# Patient Record
Sex: Female | Born: 1950 | Race: White | Hispanic: No | Marital: Married | Smoking: Never smoker
Health system: Southern US, Community
[De-identification: ages and names within clinical notes are randomized; demographics above are authoritative.]

## PROBLEM LIST (undated history)

## (undated) DIAGNOSIS — R5383 Other fatigue: Secondary | ICD-10-CM

## (undated) DIAGNOSIS — I471 Supraventricular tachycardia, unspecified: Secondary | ICD-10-CM

## (undated) DIAGNOSIS — M069 Rheumatoid arthritis, unspecified: Secondary | ICD-10-CM

## (undated) DIAGNOSIS — Z8041 Family history of malignant neoplasm of ovary: Secondary | ICD-10-CM

## (undated) DIAGNOSIS — M1990 Unspecified osteoarthritis, unspecified site: Secondary | ICD-10-CM

## (undated) DIAGNOSIS — Z9889 Other specified postprocedural states: Secondary | ICD-10-CM

## (undated) DIAGNOSIS — Z801 Family history of malignant neoplasm of trachea, bronchus and lung: Secondary | ICD-10-CM

## (undated) DIAGNOSIS — I2510 Atherosclerotic heart disease of native coronary artery without angina pectoris: Secondary | ICD-10-CM

## (undated) DIAGNOSIS — I499 Cardiac arrhythmia, unspecified: Secondary | ICD-10-CM

## (undated) DIAGNOSIS — Z808 Family history of malignant neoplasm of other organs or systems: Secondary | ICD-10-CM

## (undated) DIAGNOSIS — B029 Zoster without complications: Secondary | ICD-10-CM

## (undated) DIAGNOSIS — H9190 Unspecified hearing loss, unspecified ear: Secondary | ICD-10-CM

## (undated) DIAGNOSIS — E079 Disorder of thyroid, unspecified: Secondary | ICD-10-CM

## (undated) DIAGNOSIS — R112 Nausea with vomiting, unspecified: Secondary | ICD-10-CM

## (undated) DIAGNOSIS — G4730 Sleep apnea, unspecified: Secondary | ICD-10-CM

## (undated) DIAGNOSIS — L570 Actinic keratosis: Secondary | ICD-10-CM

## (undated) DIAGNOSIS — F419 Anxiety disorder, unspecified: Secondary | ICD-10-CM

## (undated) DIAGNOSIS — I10 Essential (primary) hypertension: Secondary | ICD-10-CM

## (undated) DIAGNOSIS — N189 Chronic kidney disease, unspecified: Secondary | ICD-10-CM

## (undated) DIAGNOSIS — I509 Heart failure, unspecified: Secondary | ICD-10-CM

## (undated) DIAGNOSIS — R42 Dizziness and giddiness: Secondary | ICD-10-CM

## (undated) DIAGNOSIS — G709 Myoneural disorder, unspecified: Secondary | ICD-10-CM

## (undated) DIAGNOSIS — B0229 Other postherpetic nervous system involvement: Secondary | ICD-10-CM

## (undated) DIAGNOSIS — T753XXA Motion sickness, initial encounter: Secondary | ICD-10-CM

## (undated) HISTORY — DX: Family history of malignant neoplasm of other organs or systems: Z80.8

## (undated) HISTORY — DX: Actinic keratosis: L57.0

## (undated) HISTORY — PX: OTHER SURGICAL HISTORY: SHX169

## (undated) HISTORY — DX: Family history of malignant neoplasm of trachea, bronchus and lung: Z80.1

## (undated) HISTORY — PX: HAND SURGERY: SHX662

## (undated) HISTORY — DX: Other fatigue: R53.83

## (undated) HISTORY — PX: CARDIAC SURGERY: SHX584

## (undated) HISTORY — DX: Anxiety disorder, unspecified: F41.9

## (undated) HISTORY — PX: GASTRIC BYPASS: SHX52

## (undated) HISTORY — PX: ANKLE SURGERY: SHX546

## (undated) HISTORY — PX: APPENDECTOMY: SHX54

## (undated) HISTORY — PX: BLADDER REPAIR: SHX76

## (undated) HISTORY — DX: Family history of malignant neoplasm of ovary: Z80.41

## (undated) HISTORY — DX: Atherosclerotic heart disease of native coronary artery without angina pectoris: I25.10

## (undated) HISTORY — PX: KNEE ARTHROSCOPY: SUR90

## (undated) HISTORY — PX: CARDIAC CATHETERIZATION: SHX172

---

## 1999-05-28 HISTORY — PX: CORONARY ARTERY BYPASS GRAFT: SHX141

## 2014-07-12 DIAGNOSIS — F32A Depression, unspecified: Secondary | ICD-10-CM | POA: Insufficient documentation

## 2014-07-12 DIAGNOSIS — E782 Mixed hyperlipidemia: Secondary | ICD-10-CM | POA: Insufficient documentation

## 2014-07-12 DIAGNOSIS — M25519 Pain in unspecified shoulder: Secondary | ICD-10-CM | POA: Insufficient documentation

## 2014-07-12 DIAGNOSIS — F329 Major depressive disorder, single episode, unspecified: Secondary | ICD-10-CM | POA: Insufficient documentation

## 2014-07-12 DIAGNOSIS — E785 Hyperlipidemia, unspecified: Secondary | ICD-10-CM | POA: Insufficient documentation

## 2014-07-12 DIAGNOSIS — E039 Hypothyroidism, unspecified: Secondary | ICD-10-CM | POA: Insufficient documentation

## 2014-07-22 DIAGNOSIS — M65819 Other synovitis and tenosynovitis, unspecified shoulder: Secondary | ICD-10-CM | POA: Insufficient documentation

## 2014-07-22 DIAGNOSIS — M7542 Impingement syndrome of left shoulder: Secondary | ICD-10-CM | POA: Insufficient documentation

## 2014-07-22 DIAGNOSIS — M65812 Other synovitis and tenosynovitis, left shoulder: Secondary | ICD-10-CM | POA: Insufficient documentation

## 2014-07-22 DIAGNOSIS — M7540 Impingement syndrome of unspecified shoulder: Secondary | ICD-10-CM | POA: Insufficient documentation

## 2014-08-03 ENCOUNTER — Ambulatory Visit: Payer: Self-pay | Admitting: Otolaryngology

## 2014-11-27 ENCOUNTER — Emergency Department
Admission: EM | Admit: 2014-11-27 | Discharge: 2014-11-28 | Disposition: A | Discharge status: Home / Self Care | Payer: Medicare Other | Attending: Emergency Medicine | Admitting: Emergency Medicine

## 2014-11-27 ENCOUNTER — Other Ambulatory Visit: Payer: Self-pay

## 2014-11-27 ENCOUNTER — Emergency Department: Payer: Medicare Other

## 2014-11-27 DIAGNOSIS — Z79899 Other long term (current) drug therapy: Secondary | ICD-10-CM | POA: Insufficient documentation

## 2014-11-27 DIAGNOSIS — I10 Essential (primary) hypertension: Secondary | ICD-10-CM | POA: Insufficient documentation

## 2014-11-27 DIAGNOSIS — Z7982 Long term (current) use of aspirin: Secondary | ICD-10-CM | POA: Insufficient documentation

## 2014-11-27 DIAGNOSIS — R1011 Right upper quadrant pain: Secondary | ICD-10-CM | POA: Diagnosis present

## 2014-11-27 DIAGNOSIS — Z880 Allergy status to penicillin: Secondary | ICD-10-CM | POA: Insufficient documentation

## 2014-11-27 DIAGNOSIS — R52 Pain, unspecified: Secondary | ICD-10-CM

## 2014-11-27 DIAGNOSIS — K858 Other acute pancreatitis without necrosis or infection: Secondary | ICD-10-CM

## 2014-11-27 DIAGNOSIS — R0789 Other chest pain: Secondary | ICD-10-CM | POA: Diagnosis not present

## 2014-11-27 DIAGNOSIS — R1084 Generalized abdominal pain: Secondary | ICD-10-CM

## 2014-11-27 DIAGNOSIS — N390 Urinary tract infection, site not specified: Secondary | ICD-10-CM | POA: Diagnosis not present

## 2014-11-27 DIAGNOSIS — I208 Other forms of angina pectoris: Secondary | ICD-10-CM

## 2014-11-27 DIAGNOSIS — I209 Angina pectoris, unspecified: Secondary | ICD-10-CM | POA: Diagnosis not present

## 2014-11-27 HISTORY — DX: Essential (primary) hypertension: I10

## 2014-11-27 HISTORY — DX: Unspecified osteoarthritis, unspecified site: M19.90

## 2014-11-27 HISTORY — DX: Disorder of thyroid, unspecified: E07.9

## 2014-11-27 HISTORY — DX: Rheumatoid arthritis, unspecified: M06.9

## 2014-11-27 LAB — COMPREHENSIVE METABOLIC PANEL
ALBUMIN: 3.9 g/dL (ref 3.5–5.0)
ALK PHOS: 71 U/L (ref 38–126)
ALT: 29 U/L (ref 14–54)
AST: 35 U/L (ref 15–41)
Anion gap: 9 (ref 5–15)
BUN: 25 mg/dL — AB (ref 6–20)
CALCIUM: 9.3 mg/dL (ref 8.9–10.3)
CO2: 24 mmol/L (ref 22–32)
Chloride: 109 mmol/L (ref 101–111)
Creatinine, Ser: 0.8 mg/dL (ref 0.44–1.00)
GFR calc Af Amer: >60 mL/min (ref >60)
GFR calc non Af Amer: >60 mL/min (ref >60)
Glucose, Bld: 93 mg/dL (ref 65–99)
POTASSIUM: 3.5 mmol/L (ref 3.5–5.1)
SODIUM: 142 mmol/L (ref 135–145)
TOTAL PROTEIN: 6.9 g/dL (ref 6.5–8.1)
Total Bilirubin: 0.4 mg/dL (ref 0.3–1.2)

## 2014-11-27 LAB — CBC
HCT: 39.8 % (ref 35.0–47.0)
Hemoglobin: 14.1 g/dL (ref 12.0–16.0)
MCH: 32.9 pg (ref 26.0–34.0)
MCHC: 35.4 g/dL (ref 32.0–36.0)
MCV: 93 fL (ref 80.0–100.0)
Platelets: 221 10*3/uL (ref 150–440)
RBC: 4.28 MIL/uL (ref 3.80–5.20)
RDW: 12.8 % (ref 11.5–14.5)
WBC: 7.6 10*3/uL (ref 3.6–11.0)

## 2014-11-27 LAB — TROPONIN I: Troponin I: <0.03 ng/mL (ref <0.031)

## 2014-11-27 LAB — LIPASE, BLOOD: Lipase: 150 U/L — ABNORMAL HIGH (ref 22–51)

## 2014-11-27 MED ORDER — SODIUM CHLORIDE 0.9 % IV BOLUS (SEPSIS)
500.0000 mL | Freq: Once | INTRAVENOUS | Status: AC
  Administered 2014-11-27: 500 mL via INTRAVENOUS

## 2014-11-27 NOTE — ED Provider Notes (Signed)
[facility] Emergency Department Provider Note  ____________________________________________  Time seen: Approximately 11:27 PM  I have reviewed the triage vital signs and the nursing notes.   HISTORY  Chief Complaint Abdominal Pain    HPI Lori Wagner is a 64 y.o. female who presents to the ED from home with a chief complaint of abdominal cramping. Patient states she ate [place] food for dinner, and began having severe abdominal cramping approximately 8 PM not associated with nausea/vomiting/diarrhea. Patient has a history of gastric sleeve. States the abdominal cramping went into her chest and caused anginal symptoms which were not associated with diaphoresis, had slight shortness of breath. Anginal symptoms completely relieved with 1 nitroglycerin taken prior to arrival. Patient states usually she would not take a nitroglycerin for the anginal symptoms she was experiencing; however, she took a nitroglycerin given the abdominal crampingshe was having as well. States she has occasional anginal symptoms. Denies fever, chills, dysuria, headache, weakness, numbness, tingling. Currently all symptoms have resolved.   Past Medical History  Diagnosis Date  . Arthritis   . RA (rheumatoid arthritis)   . Hypertension   . Thyroid disease     There are no active problems to display for this patient.   Past Surgical History  Procedure Laterality Date  . Coronary artery bypass graft    . Appendectomy    . Renal colic    . Cardiac surgery    . Gastric bypass      Current Outpatient Rx  Name  Route  Sig  Dispense  Refill  . aspirin 81 MG tablet   Oral   Take 81 mg by mouth daily. 2 tablets daily         . atorvastatin (LIPITOR) 80 MG tablet   Oral   Take 80 mg by mouth daily.         Marland Kitchen buPROPion (WELLBUTRIN SR) 150 MG 12 hr tablet   Oral   Take 150 mg by mouth.         . DULoxetine (CYMBALTA) 60 MG capsule   Oral   Take 60 mg by mouth  daily.         Marland Kitchen geriatric multivitamins-minerals (ELDERTONIC/GEVRABON) ELIX   Oral   Take 15 mLs by mouth daily.         . isosorbide mononitrate (IMDUR) 30 MG 24 hr tablet   Oral   Take 30 mg by mouth daily.         Marland Kitchen levothyroxine (SYNTHROID, LEVOTHROID) 50 MCG tablet   Oral   Take 50 mcg by mouth daily before breakfast.         . metoprolol succinate (TOPROL-XL) 50 MG 24 hr tablet   Oral   Take 50 mg by mouth 2 (two) times daily. Take with or immediately following a meal.         . Multiple Vitamins-Minerals (MULTIVITAMIN ADULT PO)   Oral   Take by mouth.         . nitroGLYCERIN (NITROSTAT) 0.4 MG SL tablet   Sublingual   Place 0.4 mg under the tongue every 5 (five) minutes as needed for chest pain.         . Omega-3 Fatty Acids (FISH OIL CONCENTRATE) 1000 MG CAPS   Oral   Take 4,000 mg by mouth.           Allergies Ace inhibitors; Morphine and related; Penicillins; and Sulfa antibiotics  No family history on file.  Social History History  Substance  Use Topics  . Smoking status: Never Smoker   . Smokeless tobacco: Never Used  . Alcohol Use: No    Review of Systems Constitutional: No fever/chills Eyes: No visual changes. ENT: No sore throat. Cardiovascular: Positive for chest pain. Respiratory: Denies shortness of breath. Gastrointestinal: Positive for abdominal pain.  No nausea, no vomiting.  No diarrhea.  No constipation. Genitourinary: Negative for dysuria. Musculoskeletal: Negative for back pain. Skin: Negative for rash. Neurological: Negative for headaches, focal weakness or numbness.  10-point ROS otherwise negative.  ____________________________________________   PHYSICAL EXAM:  VITAL SIGNS: ED Triage Vitals  Enc Vitals Group     BP 11/27/14 2218 99/65 mmHg     Pulse Rate 11/27/14 2218 67     Resp 11/27/14 2300 23     Temp --      Temp src --      SpO2 11/27/14 2218 100 %     Weight 11/27/14 2152 146 lb (66.225 kg)      Height 11/27/14 2152 5\' 5"  (1.651 m)     Head Cir --      Peak Flow --      Pain Score 11/27/14 2150 4     Pain Loc --      Pain Edu? --      Excl. in [place]? --     Constitutional: Alert and oriented. Well appearing and in no acute distress. Eyes: Conjunctivae are normal. PERRL. EOMI. Head: Atraumatic. Nose: No congestion/rhinnorhea. Mouth/Throat: Mucous membranes are moist.  Oropharynx non-erythematous. Neck: No stridor.   Cardiovascular: Normal rate, regular rhythm. Grossly normal heart sounds.  Good peripheral circulation. Respiratory: Normal respiratory effort.  No retractions. Lungs CTAB. Gastrointestinal: Soft and nontender. No distention. No abdominal bruits. No CVA tenderness. Musculoskeletal: No lower extremity tenderness nor edema.  No joint effusions. Neurologic:  Normal speech and language. No gross focal neurologic deficits are appreciated. Speech is normal. No gait instability. Skin:  Skin is warm, dry and intact. No rash noted. Psychiatric: Mood and affect are normal. Speech and behavior are normal.  ____________________________________________   LABS (all labs ordered are listed, but only abnormal results are displayed)  Labs Reviewed  COMPREHENSIVE METABOLIC PANEL - Abnormal; Notable for the following:    BUN 25 (*)    All other components within normal limits  LIPASE, BLOOD - Abnormal; Notable for the following:    Lipase 150 (*)    All other components within normal limits  URINALYSIS COMPLETEWITH MICROSCOPIC ([facility] ONLY) - Abnormal; Notable for the following:    Color, Urine YELLOW (*)    APPearance HAZY (*)    Leukocytes, UA 1+ (*)    Bacteria, UA RARE (*)    Squamous Epithelial / LPF 0-5 (*)    All other components within normal limits  CBC  TROPONIN I  TROPONIN I   ____________________________________________  EKG  ED ECG REPORT I, Charmayne Odell J, the attending physician, personally viewed and interpreted this ECG.   Date: 11/27/2014  EKG Time:  2151  Rate: 60  Rhythm: normal EKG, normal sinus rhythm  Axis: Normal  Intervals:right bundle branch block  ST&T Change: Nonspecific  ____________________________________________  RADIOLOGY  Chest 2 view (viewed by me, interpreted by Dr. Dorann Lodge): No acute cardiopulmonary process.  Ultrasound abdomen limited RUQ interpreted per Dr. Radene Knee: Unremarkable right upper quadrant ultrasound. ____________________________________________   PROCEDURES  Procedure(s) performed: None  Critical Care performed: No  ____________________________________________   INITIAL IMPRESSION / ASSESSMENT AND PLAN / ED COURSE  Pertinent labs &  imaging results that were available during my care of the patient were reviewed by me and considered in my medical decision making (see chart for details).  64 year old female with a history of CAD and gastric sleeve surgery who presents with abdominal cramping after eating [place] food which precipitated anginal symptoms. Voicing no complaints currently. First troponin negative. Plan for IV fluid resuscitation, check urinalysis, repeat 3 hour troponin.  ----------------------------------------- 1:25 AM on 11/28/2014 -----------------------------------------  Patient is resting comfortably and watching TV with spouse at bedside. Updated patient of urine results indicating UTI. Will start patient on Cipro. Also noted is elevated lipase. Patient still has her gallbladder; will obtain ultrasound to evaluate biliary tree.  ----------------------------------------- 3:37 AM on 11/28/2014 -----------------------------------------  Patient resting in no acute distress. Voices no complaints of chest or abdominal pain. Updated patient and spouse of negative ultrasound results and repeat negative troponin. Strict return precautions given. Both verbalize understanding and agree with plan of care.  ____________________________________________   FINAL CLINICAL  IMPRESSION(S) / ED DIAGNOSES  Final diagnoses:  Generalized abdominal pain  Other acute pancreatitis  Angina at rest  UTI (lower urinary tract infection)      Paulette Blanch, MD 11/28/14 [phone]

## 2014-11-27 NOTE — ED Notes (Signed)
Patient reports she has had gastric sleeve surgery and tonight after she ate she had abd cramping that led to angina.  States she took a nitro and that has help the chest pain but it has not totally gone away.Marland Kitchen

## 2014-11-27 NOTE — ED Notes (Signed)
Patient here for abdominal cramping. Began about 8PM tonight. Patient ate [place] food for dinner. States she has hx of gastric sleeving but does not believe this is related. Denies any vomiting or diarrhea. Was SOB with some angina but states that is better now.

## 2014-11-28 ENCOUNTER — Emergency Department: Payer: Medicare Other

## 2014-11-28 LAB — URINALYSIS COMPLETE WITH MICROSCOPIC (ARMC ONLY)
Bilirubin Urine: NEGATIVE
Glucose, UA: NEGATIVE mg/dL
HGB URINE DIPSTICK: NEGATIVE
Ketones, ur: NEGATIVE mg/dL
Nitrite: NEGATIVE
PH: 6 (ref 5.0–8.0)
Protein, ur: NEGATIVE mg/dL
SPECIFIC GRAVITY, URINE: 1.026 (ref 1.005–1.030)

## 2014-11-28 LAB — TROPONIN I: Troponin I: <0.03 ng/mL (ref <0.031)

## 2014-11-28 MED ORDER — CIPROFLOXACIN HCL 500 MG PO TABS: 500.0000 mg | ORAL_TABLET | Freq: Two times a day (BID) | ORAL | Status: DC

## 2014-11-28 MED ORDER — HYDROCODONE-ACETAMINOPHEN 5-325 MG PO TABS: 1.0000 | ORAL_TABLET | Freq: Four times a day (QID) | ORAL | Status: DC | PRN

## 2014-11-28 MED ORDER — ONDANSETRON HCL 4 MG PO TABS: 4.0000 mg | ORAL_TABLET | Freq: Three times a day (TID) | ORAL | Status: DC | PRN

## 2014-11-28 NOTE — ED Notes (Signed)
Patient to ultrasound

## 2014-11-28 NOTE — Discharge Instructions (Signed)
1. Take antibiotic as prescribed (Cipro 500mg  twice daily x 7 days). 2. Take medicines as needed for pain & nausea (Norco/Zofran #15). 3. Bland diet x 1 week, then slowly advance diet as tolerated.  4. Return to the ER for worsening symptoms, fever, persistent vomiting, chest pain or other concerns.  Abdominal Pain Many things can cause abdominal pain. Usually, abdominal pain is not caused by a disease and will improve without treatment. It can often be observed and treated at home. Your health care provider will do a physical exam and possibly order blood tests and X-rays to help determine the seriousness of your pain. However, in many cases, more time must pass before a clear cause of the pain can be found. Before that point, your health care provider may not know if you need more testing or further treatment. HOME CARE INSTRUCTIONS  Monitor your abdominal pain for any changes. The following actions may help to alleviate any discomfort you are experiencing:  Only take over-the-counter or prescription medicines as directed by your health care provider.  Do not take laxatives unless directed to do so by your health care provider.  Try a clear liquid diet (broth, tea, or water) as directed by your health care provider. Slowly move to a bland diet as tolerated. SEEK MEDICAL CARE IF:  You have unexplained abdominal pain.  You have abdominal pain associated with nausea or diarrhea.  You have pain when you urinate or have a bowel movement.  You experience abdominal pain that wakes you in the night.  You have abdominal pain that is worsened or improved by eating food.  You have abdominal pain that is worsened with eating fatty foods.  You have a fever. SEEK IMMEDIATE MEDICAL CARE IF:   Your pain does not go away within 2 hours.  You keep throwing up (vomiting).  Your pain is felt only in portions of the abdomen, such as the right side or the left lower portion of the abdomen.  You  pass bloody or black tarry stools. MAKE SURE YOU:  Understand these instructions.   Will watch your condition.   Will get help right away if you are not doing well or get worse.  Document Released: 02/20/2005 Document Revised: 05/18/2013 Document Reviewed: 01/20/2013 [facility] Patient Information 2015 [place], [place]. This information is not intended to replace advice given to you by your health care provider. Make sure you discuss any questions you have with your health care provider.  Chest Pain (Nonspecific) It is often hard to give a specific diagnosis for the cause of chest pain. There is always a chance that your pain could be related to something serious, such as a heart attack or a blood clot in the lungs. You need to follow up with your health care provider for further evaluation. CAUSES   Heartburn.  Pneumonia or bronchitis.  Anxiety or stress.  Inflammation around your heart (pericarditis) or lung (pleuritis or pleurisy).  A blood clot in the lung.  A collapsed lung (pneumothorax). It can develop suddenly on its own (spontaneous pneumothorax) or from trauma to the chest.  Shingles infection (herpes zoster virus). The chest wall is composed of bones, muscles, and cartilage. Any of these can be the source of the pain.  The bones can be bruised by injury.  The muscles or cartilage can be strained by coughing or overwork.  The cartilage can be affected by inflammation and become sore (costochondritis). DIAGNOSIS  Lab tests or other studies may be needed to  find the cause of your pain. Your health care provider may have you take a test called an ambulatory electrocardiogram (ECG). An ECG records your heartbeat patterns over a 24-hour period. You may also have other tests, such as:  Transthoracic echocardiogram (TTE). During echocardiography, sound waves are used to evaluate how blood flows through your heart.  Transesophageal echocardiogram (TEE).  Cardiac  monitoring. This allows your health care provider to monitor your heart rate and rhythm in real time.  Holter monitor. This is a portable device that records your heartbeat and can help diagnose heart arrhythmias. It allows your health care provider to track your heart activity for several days, if needed.  Stress tests by exercise or by giving medicine that makes the heart beat faster. TREATMENT   Treatment depends on what may be causing your chest pain. Treatment may include:  Acid blockers for heartburn.  Anti-inflammatory medicine.  Pain medicine for inflammatory conditions.  Antibiotics if an infection is present.  You may be advised to change lifestyle habits. This includes stopping smoking and avoiding alcohol, caffeine, and chocolate.  You may be advised to keep your head raised (elevated) when sleeping. This reduces the chance of acid going backward from your stomach into your esophagus. Most of the time, nonspecific chest pain will improve within 2-3 days with rest and mild pain medicine.  HOME CARE INSTRUCTIONS   If antibiotics were prescribed, take them as directed. Finish them even if you start to feel better.  For the next few days, avoid physical activities that bring on chest pain. Continue physical activities as directed.  Do not use any tobacco products, including cigarettes, chewing tobacco, or electronic cigarettes.  Avoid drinking alcohol.  Only take medicine as directed by your health care provider.  Follow your health care provider's suggestions for further testing if your chest pain does not go away.  Keep any follow-up appointments you made. If you do not go to an appointment, you could develop lasting (chronic) problems with pain. If there is any problem keeping an appointment, call to reschedule. SEEK MEDICAL CARE IF:   Your chest pain does not go away, even after treatment.  You have a rash with blisters on your chest.  You have a fever. SEEK  IMMEDIATE MEDICAL CARE IF:   You have increased chest pain or pain that spreads to your arm, neck, jaw, back, or abdomen.  You have shortness of breath.  You have an increasing cough, or you cough up blood.  You have severe back or abdominal pain.  You feel nauseous or vomit.  You have severe weakness.  You faint.  You have chills. This is an emergency. Do not wait to see if the pain will go away. Get medical help at once. Call your local emergency services (911 in [place] Do not drive yourself to the hospital. MAKE SURE YOU:   Understand these instructions.  Will watch your condition.  Will get help right away if you are not doing well or get worse. Document Released: 02/20/2005 Document Revised: 05/18/2013 Document Reviewed: 12/17/2007 [facility] Patient Information 2015 [place], [place]. This information is not intended to replace advice given to you by your health care provider. Make sure you discuss any questions you have with your health care provider.  Urinary Tract Infection Urinary tract infections (UTIs) can develop anywhere along your urinary tract. Your urinary tract is your body's drainage system for removing wastes and extra water. Your urinary tract includes two kidneys, two ureters, a bladder, and  a urethra. Your kidneys are a pair of bean-shaped organs. Each kidney is about the size of your fist. They are located below your ribs, one on each side of your spine. CAUSES Infections are caused by microbes, which are microscopic organisms, including fungi, viruses, and bacteria. These organisms are so small that they can only be seen through a microscope. Bacteria are the microbes that most commonly cause UTIs. SYMPTOMS  Symptoms of UTIs may vary by age and gender of the patient and by the location of the infection. Symptoms in young women typically include a frequent and intense urge to urinate and a painful, burning feeling in the bladder or urethra during urination. Older  women and men are more likely to be tired, shaky, and weak and have muscle aches and abdominal pain. A fever may mean the infection is in your kidneys. Other symptoms of a kidney infection include pain in your back or sides below the ribs, nausea, and vomiting. DIAGNOSIS To diagnose a UTI, your caregiver will ask you about your symptoms. Your caregiver also will ask to provide a urine sample. The urine sample will be tested for bacteria and white blood cells. White blood cells are made by your body to help fight infection. TREATMENT  Typically, UTIs can be treated with medication. Because most UTIs are caused by a bacterial infection, they usually can be treated with the use of antibiotics. The choice of antibiotic and length of treatment depend on your symptoms and the type of bacteria causing your infection. HOME CARE INSTRUCTIONS  If you were prescribed antibiotics, take them exactly as your caregiver instructs you. Finish the medication even if you feel better after you have only taken some of the medication.  Drink enough water and fluids to keep your urine clear or pale yellow.  Avoid caffeine, tea, and carbonated beverages. They tend to irritate your bladder.  Empty your bladder often. Avoid holding urine for long periods of time.  Empty your bladder before and after sexual intercourse.  After a bowel movement, women should cleanse from front to back. Use each tissue only once. SEEK MEDICAL CARE IF:   You have back pain.  You develop a fever.  Your symptoms do not begin to resolve within 3 days. SEEK IMMEDIATE MEDICAL CARE IF:   You have severe back pain or lower abdominal pain.  You develop chills.  You have nausea or vomiting.  You have continued burning or discomfort with urination. MAKE SURE YOU:   Understand these instructions.  Will watch your condition.  Will get help right away if you are not doing well or get worse. Document Released: 02/20/2005 Document  Revised: 11/12/2011 Document Reviewed: 06/21/2011 [facility] Patient Information 2015 [place], [place]. This information is not intended to replace advice given to you by your health care provider. Make sure you discuss any questions you have with your health care provider.

## 2014-12-05 ENCOUNTER — Encounter: Payer: Self-pay | Admitting: Psychiatry

## 2014-12-05 ENCOUNTER — Other Ambulatory Visit: Payer: Self-pay

## 2014-12-05 ENCOUNTER — Ambulatory Visit (INDEPENDENT_AMBULATORY_CARE_PROVIDER_SITE_OTHER): Payer: Medicare Other | Admitting: Psychiatry

## 2014-12-05 VITALS — BP 118/68 | HR 72 | Temp 98.1°F | Ht 65.5 in | Wt 153.2 lb

## 2014-12-05 DIAGNOSIS — F332 Major depressive disorder, recurrent severe without psychotic features: Secondary | ICD-10-CM

## 2014-12-05 DIAGNOSIS — F410 Panic disorder [episodic paroxysmal anxiety] without agoraphobia: Secondary | ICD-10-CM

## 2014-12-05 MED ORDER — LORAZEPAM 0.5 MG PO TABS: 0.5000 mg | ORAL_TABLET | Freq: Every day | ORAL | Status: DC
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
## 2014-12-21 ENCOUNTER — Ambulatory Visit (INDEPENDENT_AMBULATORY_CARE_PROVIDER_SITE_OTHER): Payer: Medicare Other | Admitting: Licensed Clinical Social Worker

## 2014-12-21 DIAGNOSIS — F332 Major depressive disorder, recurrent severe without psychotic features: Secondary | ICD-10-CM

## 2014-12-21 DIAGNOSIS — F410 Panic disorder [episodic paroxysmal anxiety] without agoraphobia: Secondary | ICD-10-CM
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
## 2014-12-28 ENCOUNTER — Ambulatory Visit (INDEPENDENT_AMBULATORY_CARE_PROVIDER_SITE_OTHER): Payer: Medicare Other | Admitting: Licensed Clinical Social Worker

## 2014-12-28 DIAGNOSIS — F332 Major depressive disorder, recurrent severe without psychotic features: Secondary | ICD-10-CM | POA: Diagnosis not present

## 2014-12-28 DIAGNOSIS — F410 Panic disorder [episodic paroxysmal anxiety] without agoraphobia: Secondary | ICD-10-CM | POA: Diagnosis not present

## 2014-12-28 NOTE — Progress Notes (Signed)
   THERAPIST PROGRESS NOTE  Session Time: 11:15 a.m. - 12:20 p.m.  Participation Level: Active  Behavioral Response: NeatAlertAngry, Anxious and Depressed  Type of Therapy: Individual Therapy  Treatment Goals addressed: Anger and Coping  Interventions: Strength-based, Supportive and Reframing  Summary: Lori Wagner is a 64 y.o. female who presents with ongoing depression, anxiety and anger as primary concerns.  Remains compliant with medications and denied any concerns with current psychotropics.  Client discussed various conflicts with friends and stated that friends "Accuse me of having to have the last word."  She admits to have a stubborn streak when she is passionate about situations/beliefs.  Fear discussed about not trusting herself or her abilities to gauge or interpret things that people say to her.  Additional examples shared with LCSW in terms of inter-personal situations and client with increasing awareness around the impact of automatic negative thoughts/self-appraisal and interpretation of others as being critical or rejecting of her and her beliefs/opinions.    Client described personal thoughts, feelings and experiences related to negative self talk, fears of embarrassing self or being judged by others and acknowledged presence of unhelpful or irrational thinking styles.  Inadequate pattern of communication with partner, friends and former colleagues was discussed and how these have led to frequent arguing and conflicts, especially when Detta admits to "I think they think I'm stupid or wrong."  She denied panics, able to tend to tasks around the home and one source of stress continues with friend and friend's significant other helping client and her spouse with home repairs that are taking longer than expected.  Otherwise, Cecile denied recent outbursts.  No additional concerns or needs indicated and she remains active in the therapeutic process and indicated that sessions are  meeting her needs.  Suicidal/Homicidal: Negativewithout intent/plan  Therapist Response:   CBT used to restructure thoughts with emphasis on helping client to manage emotions, including anger, fear and sadness that are realistic and understandable in context of diagnosis and degree of functional losses.  Examined cognitions and used CBT to defuse thoughts of helplessness, negative forecasting and catastrophizing about situations involving her relationships.  Engaged client in evaluating thoughts in terms of their usefulness and their basis in reality. Commended client on learning to identify and challenge beliefs that represent distortions of reality and that are destructive to client's well-being and social and intimate relationships.  Supportive psychotherapy with insight was provided to client along with much emotional support and encouragement.  Commended client for opening up about emotional symptoms that have negatively impacted her mood and behaviors.  Plan: Return again in 1-2 weeks.  Diagnosis: Major Depressive Disorder, Recurrent, Severe, Without Psychosis   Panic Disorder   Miguel Dibble, LCSW 12/28/2014

## 2015-01-04 ENCOUNTER — Ambulatory Visit (INDEPENDENT_AMBULATORY_CARE_PROVIDER_SITE_OTHER): Payer: Medicare Other | Admitting: Licensed Clinical Social Worker

## 2015-01-04 DIAGNOSIS — F410 Panic disorder [episodic paroxysmal anxiety] without agoraphobia: Secondary | ICD-10-CM | POA: Diagnosis not present

## 2015-01-04 DIAGNOSIS — F332 Major depressive disorder, recurrent severe without psychotic features: Secondary | ICD-10-CM

## 2015-01-04 NOTE — Progress Notes (Signed)
THERAPIST PROGRESS NOTE  Session Time: 2:20 p.m. - 3:15 p.m.  Participation Level: Active  Behavioral Response: DisheveledAlertAnxious and Depressed  Type of Therapy: Individual Therapy  Treatment Goals addressed: Anger, Anxiety and Coping  Interventions: CBT, Solution Focused, Strength-based, Supportive, Family Systems and Reframing  Summary: Lori Wagner is a 64 y.o. 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  Lori Wagner shared another situation in her twenties where she was attending a political event and her behavior was mis-interpreted leading to rumors and media coverage of her being "A communist and a lesbian."  She discussed several triggers, one being conversations with people who repeat things about people that are rumors or fail to include of all of the facts. Being a victim, both real and imagined beliefs around this contribute to reported symptoms and Lori Wagner voiced awareness of this as her experience.  Additional self-admission that "I know I can get angry."  Discussed underlying culprits/causes for anger that becomes out of control for her and client indicated being passionate about something that she believes in or disagrees with can sometimes be a contributing factor.  Ongoing conflict with friends who are helping to do home remodeling.  She admits her role in the conflict.  Processed with LCSW the family of origins and history with these people with Lori Wagner coming to realization that "That is him. I decided to take the high road. I could have asked him where my apology was but I let it go."  Her decision was that friendship with  long term friend was more important than trying to prove herself right to friend or friend's significant other.  Client shared more history about their relationship and was able to identify several thoughts and feelings that increase her inability to tolerate the friend's partner.  Lori Wagner indicated that she continues to feel that therapy is going in an appropriate direction and that LCSW is assisting her to bring out unresolved issues and more clearly recognize the role of self-directed anger, defense mechanisms and unhealthy thinking patterns on her mood and outlook.  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  "I'm not letting them off of the hook. If they don't email or call back then I'll show up where they work."  Underlying triggers for anger were discussed further.  She has a follow up later in the week with Dr. Jimmye Norman in this office for medication management.  Suicidal/Homicidal: Negativewithout intent/plan  Therapist Response:   Reinforced client's strengths and motivation to process unhealthy thinking patterns that contribute to client's irritable mood, insecurity, defense stance and beliefs that others are either rejecting of her or disapproving.  Gently cautioned about internalizing other's behaviors and comments while seeing where there may be opportunities for her to let go of relationships and situations that she does not have control over with goal being acceptance and integration of these events as part of her life story.   Supportive psychotherapy with insight was provided to client along with much emotional support and encouragement.  Commended client for opening up about  emotional symptoms that have negatively impacted her moods, behaviors and have placed a strain on her relationships.  Work with client in sessions to educate to anger process and to develop non-self-defeating ways  of handling angry feelings and decreasing incidents of aggression.  Provided additional psycho-educational materials on anger and questions to begin to ask self about her anger for discussion at next session.  Plan: Return again in 1-2 weeks. Lori Wagner will take medications as prescribed and keep all appointments.    Diagnosis: Major Depressive Disorder, Recurrent, Severe, Without Psychosis   Panic Disorder  Miguel Dibble, LCSW 01/04/2015

## 2015-01-05 ENCOUNTER — Ambulatory Visit (INDEPENDENT_AMBULATORY_CARE_PROVIDER_SITE_OTHER): Payer: Medicare Other | Admitting: Psychiatry

## 2015-01-05 DIAGNOSIS — F332 Major depressive disorder, recurrent severe without psychotic features: Secondary | ICD-10-CM

## 2015-01-05 MED ORDER — BUPROPION HCL ER (SR) 150 MG PO TB12: 150.0000 mg | ORAL_TABLET | Freq: Two times a day (BID) | ORAL | Status: DC

## 2015-01-05 MED ORDER — CLONAZEPAM 0.5 MG PO TABS: 0.5000 mg | ORAL_TABLET | Freq: Two times a day (BID) | ORAL | Status: DC

## 2015-01-05 MED ORDER — DULOXETINE HCL 60 MG PO CPEP: 60.0000 mg | ORAL_CAPSULE | Freq: Every day | ORAL | Status: DC

## 2015-01-05 NOTE — Progress Notes (Signed)
BH MD/PA/NP OP Progress Note  01/05/2015 10:57 AM Lori Wagner  MRN:  [id number]  Subjective:  Patient returns for follow-up of her major depressive disorder, recurrent, severe without psychotic features and panic disorder. She states the biggest issue for her right now is that some friends are in the house for the past 6 weeks helping with the renovation. She states is been multiple disputes about how to proceed with plans. She states they have occasionally resulted in her having a "meltdown." She stated one most recently she ended up yelling and cursing at her friend. She states she has not been able to enjoy many things since they have been around however they will be leaving in 2 days and she states she looks forward to a quiet time just being able to sit down and watch TV. She states she has not really been experiencing continuous depression where she is not getting out of bed but that she does have a couple of days here and there were she is down. She states her more predominant issue is just constantly feeling angry and anxious. Chief Complaint: angry Visit Diagnosis:  No diagnosis found.  Past Medical History:  Past Medical History  Diagnosis Date  . Arthritis   . RA (rheumatoid arthritis)   . Hypertension   . Thyroid disease   . Anxiety   . Coronary artery disease   . Fatigue     Past Surgical History  Procedure Laterality Date  . Coronary artery bypass graft    . Appendectomy    . Renal colic    . Cardiac surgery    . Gastric bypass    . Ankle surgery Left   . Hand surgery Right   . Bladder repair     Family History:  Family History  Problem Relation Age of Onset  . Hypertension Mother   . Coronary artery disease Mother   . Arthritis Mother   . Osteoporosis Mother   . Depression Mother   . Anxiety disorder Mother   . Coronary artery disease Father   . Stroke Father   . Coronary artery disease Brother   . Coronary artery disease Maternal Aunt   . Osteoporosis  Maternal Aunt   . Coronary artery disease Cousin   . Osteoporosis Cousin   . Bipolar disorder Brother   . Drug abuse Brother    Social History:  Social History   Social History  . Marital Status: Married    Spouse Name: N/A  . Number of Children: N/A  . Years of Education: N/A   Social History Main Topics  . Smoking status: Never Smoker   . Smokeless tobacco: Never Used  . Alcohol Use: No  . Drug Use: No  . Sexual Activity: No   Other Topics Concern  . Not on file   Social History Narrative   Additional History:   Assessment:   Musculoskeletal: Strength & Muscle Tone: within normal limits Gait & Station: normal Patient leans: N/A  Psychiatric Specialty Exam: HPI  Review of Systems  Psychiatric/Behavioral: Positive for depression. Negative for suicidal ideas, hallucinations, memory loss and substance abuse. The patient is nervous/anxious and has insomnia.     There were no vitals taken for this visit.There is no weight on file to calculate BMI.  General Appearance: Well Groomed  Eye Contact:  Good  Speech:  Normal Rate  Volume:  Normal  Mood:  Okay  Affect:  Constricted  Thought Process:  Linear and Logical  Orientation:  Full (  Time, Place, and Person)  Thought Content:  Negative  Suicidal Thoughts:  No  Homicidal Thoughts:  No  Memory:  Immediate;   Good Recent;   Good Remote;   Good  Judgement:  Good  Insight:  Good  Psychomotor Activity:  Negative  Concentration:  Good  Recall:  Good  Fund of Knowledge: Good  Language: Good  Akathisia:  Negative  Handed:  Right  AIMS (if indicated):  N/A  Assets:  Communication Skills Desire for Improvement Social Support  ADL's:  Intact  Cognition: WNL  Sleep:  fair   Is the patient at risk to self?  No. Has the patient been a risk to self in the past 6 months?  No. Has the patient been a risk to self within the distant past?  No. Is the patient a risk to others?  No. Has the patient been a risk to  others in the past 6 months?  No. Has the patient been a risk to others within the distant past?  No.  Current Medications: Current Outpatient Prescriptions  Medication Sig Dispense Refill  . aspirin 81 MG tablet Take 81 mg by mouth daily. 2 tablets daily    . atorvastatin (LIPITOR) 80 MG tablet Take 80 mg by mouth daily.    Marland Kitchen buPROPion (WELLBUTRIN SR) 150 MG 12 hr tablet Take 150 mg by mouth.    . calcium carbonate (TUMS EX) 750 MG chewable tablet Chew by mouth.    . Cholecalciferol (VITAMIN D-1000 MAX ST) 1000 UNITS tablet Take by mouth.    . clonazePAM (KLONOPIN) 0.5 MG tablet Take 1 tablet (0.5 mg total) by mouth 2 (two) times daily. 60 tablet 1  . DULoxetine (CYMBALTA) 60 MG capsule Take 60 mg by mouth daily.    . isosorbide mononitrate (IMDUR) 30 MG 24 hr tablet Take 30 mg by mouth daily.    Marland Kitchen levothyroxine (SYNTHROID, LEVOTHROID) 50 MCG tablet Take 50 mcg by mouth daily before breakfast.    . LORazepam (ATIVAN) 0.5 MG tablet Take 1 tablet (0.5 mg total) by mouth at bedtime. 30 tablet 1  . metoprolol succinate (TOPROL-XL) 50 MG 24 hr tablet Take 50 mg by mouth 2 (two) times daily. Take with or immediately following a meal.    . nitroGLYCERIN (NITROSTAT) 0.4 MG SL tablet Place 0.4 mg under the tongue every 5 (five) minutes as needed for chest pain.    . Omega-3 Fatty Acids (FISH OIL CONCENTRATE) 1000 MG CAPS Take 4,000 mg by mouth.     No current facility-administered medications for this visit.    Medical Decision Making:  Established Problem, Stable/Improving (1), Review of Medication Regimen & Side Effects (2) and Review of New Medication or Change in Dosage (2)  Treatment Plan Summary:Medication management and Plan We will continue the patient's Cymbalta at 60 mg daily, continue her Wellbutrin SR 150 mg twice daily. We will discontinue the Ativan and we will start some Klonopin 0.5 mg twice daily to address her anxiety and irritability. Patient follow up in 1 month. Risk and  benefits Klonopin discussed patient able consent. Patient's been encouraged call any questions concerns prior to next point. She is to continue therapy.   Lori Wagner 01/05/2015, 10:57 AM

## 2015-01-23 ENCOUNTER — Ambulatory Visit (INDEPENDENT_AMBULATORY_CARE_PROVIDER_SITE_OTHER): Payer: Medicare Other | Admitting: Licensed Clinical Social Worker

## 2015-01-23 DIAGNOSIS — F332 Major depressive disorder, recurrent severe without psychotic features: Secondary | ICD-10-CM

## 2015-01-23 DIAGNOSIS — F410 Panic disorder [episodic paroxysmal anxiety] without agoraphobia: Secondary | ICD-10-CM | POA: Diagnosis not present

## 2015-01-23 NOTE — Progress Notes (Signed)
THERAPIST PROGRESS NOTE  Session Time: 11:10 a.m. - 12:10 p.m.  Participation Level: Active  Behavioral Response: NeatAlertDepressed  Type of Therapy: Individual Therapy  Treatment Goals addressed: Anger, Anxiety, Communication: assertiveness vs aggression and Coping  Interventions: CBT, Solution Focused, Strength-based, Supportive and Reframing  Summary: Lori Wagner is a 64 y.o. female who returns to [facility] to address symptoms of depression and anxiety who also admits to experiencing episodic feelings of anger. Her friend and friend's partner left the area since the home remodel has been completed. Lori Wagner showed me pictures of her new bathroom.  She admits that she has been able to enjoy her new bathtub which is how she relaxes. "I've been down. I don't know if it's the medication or the fact that completing the bathroom was anti-climatic."  Talked about a job that she is considering applying for at a [employer] but is having difficulty getting professional references.  She spoke about other former colleagues and had not considered asking these individuals to provide a written reference.  "I know I'm stubborn."  She recalled several situations with various peers over the years and session focused on the losses that resulted when she lost her job in a high profile and negative social media experience in 2009.  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  One professional reference whom she is waiting to hear from is a female colleague that has not responded in any format to client even after agreeing to serve as a reference for Lori Wagner.  "I just want to know what I did to make her hate me."  Lori Wagner discussed her thoughts to show up at this person's place of employment to confront the former colleague in attempt to get closure for herself since this person will not return her emails or  phone calls.  Some insight into anger as this relates to client questioning self about occupational decisions that she made as well as not feeling that people really knew her side of the story.  In terms of her application to [employer] job, she discussed with Lori Wagner that she is considering writing an additional letter to attach with letter of interest that offers an overview of her side of the story.  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  Suicidal/Homicidal: Negativewithout intent/plan   Therapist Response:   Supportive psychotherapy with insight was provided to client along with much emotional support and encouragement.  Assisted client to become more aware of common thinking errors that can intensify negative and irrational thoughts and impact a person's feelings, moods and outlook on life.  Assisted client to re-frame a few distorted thoughts while also offering psycho-education on the concepts of radical acceptance and moving through the difficult feelings associated with disappointment in self and others.  Reinforced client's strengths and motivation to process unhealthy thinking patterns that contribute to client's irritable mood, insecurity, defense stance and beliefs that others are either rejecting of her or disapproving.  Gently cautioned about internalizing other's behaviors and comments and explored the concept of self-actualization and transcending either real or perceived failure.  Plan: Return again in 1-2 weeks. Korryn will take medications as prescribed and keep all appointments.  Client will continue to read, process and return to [facility] for ongoing discussions of  psycho-educational materials on depression, communication styles, thinking patterns and anger.  Diagnosis: Major Depressive Disorder, Recurrent, Severe, Without Psychosis   Panic Disorder   Lori Dibble, Lori Wagner 01/23/2015

## 2015-01-24 ENCOUNTER — Telehealth: Payer: Self-pay

## 2015-01-24 NOTE — Telephone Encounter (Signed)
left message for patient to call office tomorrow for more infomatio

## 2015-01-24 NOTE — Telephone Encounter (Signed)
lea gave me a message that pt called and left a message on voice mail that the medication changes were making her more depressed.

## 2015-01-25 NOTE — Telephone Encounter (Signed)
I spoke patient today. She indicated that she felt like since she's been on the Klonopin and she's been having less energy and motivation. I did discuss that perhaps it was a longer acting medicine and was sticking around longer. I've thus instructed her to discontinue her Klonopin and resume her Ativan his previous prescribed which was 0.5 mg at bedtime. We will observe if she notices a difference going back to her old medication. She individually she has a supply of Ativan remaining that may be a little less than a month. She will call if she needs more of the Ativan. She hasn't point upcoming appointment with this writer on 02/10/2015.

## 2015-02-09 ENCOUNTER — Ambulatory Visit: Payer: Medicare Other | Admitting: Licensed Clinical Social Worker

## 2015-02-10 ENCOUNTER — Encounter: Payer: Self-pay | Admitting: Psychiatry

## 2015-02-10 ENCOUNTER — Ambulatory Visit (INDEPENDENT_AMBULATORY_CARE_PROVIDER_SITE_OTHER): Payer: Medicare Other | Admitting: Psychiatry

## 2015-02-10 VITALS — BP 110/62 | HR 82 | Temp 98.1°F | Ht 65.0 in | Wt 151.4 lb

## 2015-02-10 DIAGNOSIS — F332 Major depressive disorder, recurrent severe without psychotic features: Secondary | ICD-10-CM

## 2015-02-10 DIAGNOSIS — F410 Panic disorder [episodic paroxysmal anxiety] without agoraphobia: Secondary | ICD-10-CM

## 2015-02-10 MED ORDER — BUPROPION HCL ER (SR) 200 MG PO TB12: 200.0000 mg | ORAL_TABLET | Freq: Two times a day (BID) | ORAL | Status: DC

## 2015-02-10 MED ORDER — LORAZEPAM 0.5 MG PO TABS: 0.5000 mg | ORAL_TABLET | Freq: Every day | ORAL | Status: DC

## 2015-02-10 NOTE — Progress Notes (Signed)
BH MD/PA/NP OP Progress Note  02/10/2015 12:27 PM Lori Wagner  MRN:  [id number]  Subjective:  Patient returns for follow-up of her major depressive disorder, recurrent, severe without psychotic features and panic disorder. As documented phone conversation we attempted to use Klonopin as opposed to her previously prescribed Ativan to address anxiety and irritability. Patient reported being subdued and perhaps being more depressed on the Klonopin. Thus we changed her back to the Ativan.  She states now she continues to be depressed. She states that it is happening where she is depressed 4 or 5 days out of the week. 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 She states that there was an opportunity posted a Optometrist and then she went through the process of trying to get recommendations and apply for the position. She states some of the people she spoke with about the recommended dictations were telling her that she was unlikely to get the position. She stated in fact when she moved forward she alternately did not get the position. She states this is a constant issue for her and does affect her depression. Chief Complaint: Depression Chief Complaint    Follow-up; Medication Refill; Depression    angry Visit Diagnosis:  No diagnosis found.  Past Medical History:  Past Medical History  Diagnosis Date  . Arthritis   . RA (rheumatoid arthritis)   . Hypertension   . Thyroid disease   . Anxiety   . Coronary artery disease   . Fatigue     Past Surgical History  Procedure Laterality Date  . Coronary artery bypass graft    . Appendectomy    . Renal colic    . Cardiac surgery    . Gastric bypass    . Ankle surgery Left   . Hand surgery Right   . Bladder repair     Family History:  Family History  Problem Relation Age of Onset  . Hypertension Mother   . Coronary artery disease Mother   . Arthritis Mother   . Osteoporosis Mother   . Depression  Mother   . Anxiety disorder Mother   . Coronary artery disease Father   . Stroke Father   . Coronary artery disease Brother   . Coronary artery disease Maternal Aunt   . Osteoporosis Maternal Aunt   . Coronary artery disease Cousin   . Osteoporosis Cousin   . Bipolar disorder Brother   . Drug abuse Brother    Social History:  Social History   Social History  . Marital Status: Married    Spouse Name: N/A  . Number of Children: N/A  . Years of Education: N/A   Social History Main Topics  . Smoking status: Never Smoker   . Smokeless tobacco: Never Used  . Alcohol Use: No  . Drug Use: No  . Sexual Activity: No   Other Topics Concern  . None   Social History Narrative   Additional History:   Assessment:   Musculoskeletal: Strength & Muscle Tone: within normal limits Gait & Station: normal Patient leans: N/A  Psychiatric Specialty Exam: Depression        Associated symptoms include insomnia.  Associated symptoms include no suicidal ideas.   Review of Systems  Psychiatric/Behavioral: Positive for depression. Negative for suicidal ideas, hallucinations, memory loss and substance abuse. The patient is nervous/anxious and has insomnia.     Blood pressure 110/62, pulse 82, temperature 98.1 F (36.7 C), temperature source Tympanic, height 5'  5" (1.651 m), weight 151 lb 6.4 oz (68.675 kg), SpO2 96 %.Body mass index is 25.19 kg/(m^2).  General Appearance: Well Groomed  Eye Contact:  Good  Speech:  Normal Rate  Volume:  Normal  Mood:  Okay  Affect:  Constricted  Thought Process:  Linear and Logical  Orientation:  Full (Time, Place, and Person)  Thought Content:  Negative  Suicidal Thoughts:  No  Homicidal Thoughts:  No  Memory:  Immediate;   Good Recent;   Good Remote;   Good  Judgement:  Good  Insight:  Good  Psychomotor Activity:  Negative  Concentration:  Good  Recall:  Good  Fund of Knowledge: Good  Language: Good  Akathisia:  Negative  Handed:  Right   AIMS (if indicated):  N/A  Assets:  Communication Skills Desire for Improvement Social Support  ADL's:  Intact  Cognition: WNL  Sleep:  fair   Is the patient at risk to self?  No. Has the patient been a risk to self in the past 6 months?  No. Has the patient been a risk to self within the distant past?  No. Is the patient a risk to others?  No. Has the patient been a risk to others in the past 6 months?  No. Has the patient been a risk to others within the distant past?  No.  Current Medications: Current Outpatient Prescriptions  Medication Sig Dispense Refill  . aspirin 81 MG tablet Take 81 mg by mouth daily. 2 tablets daily    . atorvastatin (LIPITOR) 80 MG tablet Take 80 mg by mouth daily.    Marland Kitchen buPROPion (WELLBUTRIN SR) 200 MG 12 hr tablet Take 1 tablet (200 mg total) by mouth 2 (two) times daily. 60 tablet 1  . calcium carbonate (TUMS EX) 750 MG chewable tablet Chew by mouth.    . Cholecalciferol (VITAMIN D-1000 MAX ST) 1000 UNITS tablet Take by mouth.    . DULoxetine (CYMBALTA) 60 MG capsule Take 1 capsule (60 mg total) by mouth daily. 90 capsule 0  . isosorbide mononitrate (IMDUR) 30 MG 24 hr tablet Take 30 mg by mouth daily.    Marland Kitchen levothyroxine (SYNTHROID, LEVOTHROID) 50 MCG tablet Take 50 mcg by mouth daily before breakfast.    . LORazepam (ATIVAN) 0.5 MG tablet Take 1 tablet (0.5 mg total) by mouth at bedtime. 30 tablet 1  . metoprolol succinate (TOPROL-XL) 50 MG 24 hr tablet Take 50 mg by mouth 2 (two) times daily. Take with or immediately following a meal.    . nitroGLYCERIN (NITROSTAT) 0.4 MG SL tablet Place 0.4 mg under the tongue every 5 (five) minutes as needed for chest pain.    . Omega-3 Fatty Acids (FISH OIL CONCENTRATE) 1000 MG CAPS Take 4,000 mg by mouth.     No current facility-administered medications for this visit.    Medical Decision Making:  Established Problem, Stable/Improving (1), Review of Medication Regimen & Side Effects (2) and Review of New  Medication or Change in Dosage (2)  Treatment Plan Summary:Medication management and Plan We will continue the patient's Cymbalta at 60 mg daily. We will increase her Wellbutrin SR to 200 mg twice daily. We will tenure her Ativan 0.5 mg at bedtime. Patient follow up in 1 month. Risk and benefits Klonopin discussed patient able consent. Patient's been encouraged call any questions concerns prior to next point. She is to continue therapy.  We have discussed that ultimately will may end up trying to add in another augmentation agent  to her depression regimen. The alternative is also to begin to substitute a new antidepressant for one of her current ones. However patient and I agree that it this point it makes sense to increase her Wellbutrin dose and assess for any response first.  Major depressive Disorder, recurrent severe-continue Cymbalta, increase Wellbutrin as discussed above.  Panic disorder-continue Ativan as discussed above.  [name] 02/10/2015, 12:27 PM

## 2015-02-13 ENCOUNTER — Other Ambulatory Visit: Payer: Self-pay

## 2015-02-13 NOTE — Telephone Encounter (Signed)
pt last seen on  02-10-15 next appt 03-14-15 rx was printed out for her mail order but pt needs enought medication to due until her mail order comes in.

## 2015-02-14 MED ORDER — LORAZEPAM 0.5 MG PO TABS: 0.5000 mg | ORAL_TABLET | Freq: Every day | ORAL | Status: DC

## 2015-02-14 NOTE — Telephone Encounter (Signed)
Unable to get printer connected to prevent this 10 day supply for lorazepam. Attempted to print a prescription 3 times but none were successful. We will have to and handwrite a prescription. AW

## 2015-02-14 NOTE — Telephone Encounter (Signed)
rx was faxed- confirmed  ID# [id number]    order ID # [id number]

## 2015-02-16 ENCOUNTER — Ambulatory Visit (INDEPENDENT_AMBULATORY_CARE_PROVIDER_SITE_OTHER): Payer: Medicare Other | Admitting: Licensed Clinical Social Worker

## 2015-02-16 DIAGNOSIS — F410 Panic disorder [episodic paroxysmal anxiety] without agoraphobia: Secondary | ICD-10-CM | POA: Diagnosis not present

## 2015-02-16 DIAGNOSIS — F332 Major depressive disorder, recurrent severe without psychotic features: Secondary | ICD-10-CM | POA: Diagnosis not present

## 2015-02-16 NOTE — Progress Notes (Signed)
   THERAPIST PROGRESS NOTE  Session Time: 10:07 a.m. - 11:15 a.m.   Participation Level: Active  Behavioral Response: CasualAlertAnxious, Depressed and Irritable  Type of Therapy: Individual Therapy  Treatment Goals addressed: Coping  Interventions: CBT, Strength-based, Supportive, Family Systems and Reframing  Summary: Lori Wagner is a 64 y.o. female who presents with ongoing symptoms of anxiety, denied panics, and depression, primarily due to desire to return to work yet with limited occupational options given prior history in other jobs and the impact of client's statements being present throughout social media.  Nhyira made contact with former colleague at [employer] and voiced "You were right, she didn't have any problems with me."  Client talked about how many problems this friend had and that although unfortunate did provide her with some validation that she was not personally being ignored or rejected.  Appropriate sadness voiced regarding feedback from colleague that it Ellamay's chances of getting a job at a [employer] is minimal.  "I'm trying to come to terms that I'm finished."  Client talked through her career and the grief associated with loss of this.    Focus of session was on her loss of trust, changes in her loyalty as a result of negative occupational outcomes.  "I think that's the reason I walk around with this barrier between me and other people." She feels more skeptical compared to how she was in the past. Continues to try to hold onto long term relationship with one specific friend yet this is difficult as her friend has little insight into the stressors in the relationship.  Family of origin issues that have carried forth and resulted in client's self admission that she has had outbursts of anger and rage were also discussed.  Some guilt and remorse regarding outbursts towards her daughter yet the two have made amends and Aubery stated "I would catch myself  after the fact and then I would go and apologize."   Revonda was receptive to information discussed during session today.  Suicidal/Homicidal: Negativewithout intent/plan  Therapist Response:   Processed the role of embracing faults and forgiving self with goal of self-actualization and recommended client to read DBT information on radical acceptance. Supportive psychotherapy with insight was provided to client along with much emotional support and encouragement.  Assisted client to become more aware of common thinking errors that can result from family of origin rules & roles.  Assisted client to re-frame difficult feelings associated with disappointment in self and others.  Reinforced client's strengths and motivation to process unhealthy thinking patterns that contribute to client's irritable mood, insecurity, defense stance and beliefs that others are either rejecting of her or disapproving.  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  Plan: Return again in 1-2 weeks. Emmy will take medications as prescribed and keep all appointments.  Client will continue to read psycho-educational materials and return to [facility] for ongoing discussions of and application of concepts.  Diagnosis: Major Depressive Disorder, Recurrent, Severe, Without Psychosis   Panic Disorder  Miguel Dibble, LCSW 02/16/2015

## 2015-03-03 ENCOUNTER — Ambulatory Visit (INDEPENDENT_AMBULATORY_CARE_PROVIDER_SITE_OTHER): Payer: 59 | Admitting: Licensed Clinical Social Worker

## 2015-03-03 DIAGNOSIS — F410 Panic disorder [episodic paroxysmal anxiety] without agoraphobia: Secondary | ICD-10-CM | POA: Diagnosis not present

## 2015-03-03 DIAGNOSIS — F332 Major depressive disorder, recurrent severe without psychotic features: Secondary | ICD-10-CM

## 2015-03-03 NOTE — Progress Notes (Signed)
THERAPIST PROGRESS NOTE  Session Time: 10:00 a.m. -   Participation Level: Active  Behavioral Response: CasualAlertAngry, Anxious and Depressed (Improving)  Type of Therapy: Individual Therapy  Treatment Goals addressed: Anger, Anxiety and Coping  Interventions: CBT, Solution Focused, Strength-based, Supportive and Reframing  Summary: Lori Wagner is a 64 y.o. female who presents with reported improvement in depression and anxiety symptoms.  "Things have been going better in the last week."  Medication changes have been helpful and her spouse has noticed improvements. Lori Wagner shared a quote from a television show that she watches which has helped her to begin to see and accept that this is a chapter of her life has ended.  There is increasing understanding that all things come to an end.  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  Ongoing hypersensitivity and feeling that she has a "kick me" sign on her back and also struggles with trying to figure who she can trust.  "There were a lot of people who turned on me."  Ongoing grief reactions and appropriate disappointment about her career being lost and loss of own validation.  She feels that she is thinking more rational. 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 Another disappointment was discussed in regards to not receiving a letter of recommendation from her former Psychologist, counselling. Appropriate emotional responses reported in relation to the stressor.  Has noticed decrease in nightmares and overall talked about coming to a place of acceptance ". . . That chapter in my life is over."  Lori Wagner also talked about how she is exploring other ways to get active and find new purpose in her life based on her interests, intellectual  training and pursuits.  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  Lori Wagner was receptive to LCSW's recommendations regarding therapy concepts such as DBT with emphasis on radical acceptance and emotion regulation and voiced "Coming here has definitely helped me."  Client informed LCSW that she did not think that she would be able to continue with [facility] because of her co-payment stating "It's not in my budget."  She talked about how she was in a better place than when she first started and is responding favorably to medications.  Client accepted the letter regarding LCSW's departure from this clinic and voiced understanding of additional options that are available to her via [facility] in the community as well as returning to see another LCSW in this clinic.  Lori Wagner thanked LCSW for support and guidance during the time she met with LCSW.  She denied additional concerns or needs during session.  Suicidal/Homicidal: Negativewithout intent/plan   Therapist Response:   Commended client on her dedication to learn to re-frame negative thinking patterns while understanding the importance of being with her emotional pain and the losses experienced.  Again reinforced understanding and use of ideas such as coping with rumination, disappointment, forgiveness and the importance of recognizing and re-framing common thinking errors that can result from family of origin rules & roles.  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  Supportive psychotherapy with insight was provided.  Normalized need to re-prioritize how she spends her money and reassured that OPT is always an option down the road.  Provided client with written and verbal notice of LCSW's departure effective  03/31/15, from this practice and welcomed calls to notify staff of her wishes regarding referral to either another LCSW in this clinic or to an outside provider.  Plan: Client terminated services due to financial stressors.  She is aware and will follow up with [facility] PRN in the future. Lori Wagner will take medications as prescribed and keep all appointments. No further plans for additional follow up with this LCSW.  Diagnosis: Major Depressive Disorder, Recurrent, Severe, Without Psychosis   Panic Disorder   Miguel Dibble, LCSW 03/03/2015

## 2015-03-14 ENCOUNTER — Encounter: Payer: Self-pay | Admitting: Psychiatry

## 2015-03-14 ENCOUNTER — Ambulatory Visit (INDEPENDENT_AMBULATORY_CARE_PROVIDER_SITE_OTHER): Payer: 59 | Admitting: Psychiatry

## 2015-03-14 DIAGNOSIS — F332 Major depressive disorder, recurrent severe without psychotic features: Secondary | ICD-10-CM

## 2015-03-14 DIAGNOSIS — F410 Panic disorder [episodic paroxysmal anxiety] without agoraphobia: Secondary | ICD-10-CM | POA: Diagnosis not present

## 2015-03-14 MED ORDER — BUPROPION HCL ER (SR) 200 MG PO TB12: 200.0000 mg | ORAL_TABLET | Freq: Two times a day (BID) | ORAL | Status: DC

## 2015-03-14 MED ORDER — DULOXETINE HCL 60 MG PO CPEP: 60.0000 mg | ORAL_CAPSULE | Freq: Every day | ORAL | Status: DC

## 2015-03-14 MED ORDER — TRAZODONE HCL 50 MG PO TABS: ORAL_TABLET | ORAL | Status: DC

## 2015-03-14 NOTE — Addendum Note (Signed)
Addended by: Marjie Skiff on: 03/14/2015 04:32 PM   Modules accepted: Orders

## 2015-03-14 NOTE — Progress Notes (Signed)
BH MD/PA/NP OP Progress Note  03/14/2015 2:51 PM Lori Wagner  MRN:  [id number]  Subjective:  Patient returns for follow-up of her major depressive disorder, recurrent, severe without psychotic features and panic disorder. Patient indicates that since her last visit we increased her Wellbutrin she feels her mood is better she is more engaged. She states her husband has noticed the improvement. She relates the biggest issue right now is continued issues with insomnia. She states she is taking the Ativan as needed at bedtime but ultimately wonders about taking this medication continuously. We discussed her past trials of medications for insomnia and it appears that she had some experience with trazodone but did not remember any side effects but that it may been ineffective. She states that she more than likely might of been on the lower end of the dosing. Chief Complaint: Better angry Visit Diagnosis:     ICD-9-CM ICD-10-CM   1. Major depressive disorder, recurrent, severe without psychotic features ([place]) 296.33 F33.2   2. Panic disorder 300.01 F41.0     Past Medical History:  Past Medical History  Diagnosis Date  . Arthritis   . RA (rheumatoid arthritis)   . Hypertension   . Thyroid disease   . Anxiety   . Coronary artery disease   . Fatigue     Past Surgical History  Procedure Laterality Date  . Coronary artery bypass graft    . Appendectomy    . Renal colic    . Cardiac surgery    . Gastric bypass    . Ankle surgery Left   . Hand surgery Right   . Bladder repair     Family History:  Family History  Problem Relation Age of Onset  . Hypertension Mother   . Coronary artery disease Mother   . Arthritis Mother   . Osteoporosis Mother   . Depression Mother   . Anxiety disorder Mother   . Coronary artery disease Father   . Stroke Father   . Coronary artery disease Brother   . Coronary artery disease Maternal Aunt   . Osteoporosis Maternal Aunt   . Coronary artery  disease Cousin   . Osteoporosis Cousin   . Bipolar disorder Brother   . Drug abuse Brother    Social History:  Social History   Social History  . Marital Status: Married    Spouse Name: N/A  . Number of Children: N/A  . Years of Education: N/A   Social History Main Topics  . Smoking status: Never Smoker   . Smokeless tobacco: Never Used  . Alcohol Use: No  . Drug Use: No  . Sexual Activity: No   Other Topics Concern  . Not on file   Social History Narrative   Additional History:   Assessment:   Musculoskeletal: Strength & Muscle Tone: within normal limits Gait & Station: normal Patient leans: N/A  Psychiatric Specialty Exam: Depression        Associated symptoms include insomnia.  Associated symptoms include no suicidal ideas.   Review of Systems  Psychiatric/Behavioral: Negative for depression, suicidal ideas, hallucinations, memory loss and substance abuse. The patient has insomnia. The patient is not nervous/anxious.   All other systems reviewed and are negative.   There were no vitals taken for this visit.There is no weight on file to calculate BMI.  General Appearance: Well Groomed  Eye Contact:  Good  Speech:  Normal Rate  Volume:  Normal  Mood:  Okay  Affect:  Slightly brighter  Thought Process:  Linear and Logical  Orientation:  Full (Time, Place, and Person)  Thought Content:  Negative  Suicidal Thoughts:  No  Homicidal Thoughts:  No  Memory:  Immediate;   Good Recent;   Good Remote;   Good  Judgement:  Good  Insight:  Good  Psychomotor Activity:  Negative  Concentration:  Good  Recall:  Good  Fund of Knowledge: Good  Language: Good  Akathisia:  Negative  Handed:  Right  AIMS (if indicated):  N/A  Assets:  Communication Skills Desire for Improvement Social Support  ADL's:  Intact  Cognition: WNL  Sleep:  fair   Is the patient at risk to self?  No. Has the patient been a risk to self in the past 6 months?  No. Has the patient been a  risk to self within the distant past?  No. Is the patient a risk to others?  No. Has the patient been a risk to others in the past 6 months?  No. Has the patient been a risk to others within the distant past?  No.  Current Medications: Current Outpatient Prescriptions  Medication Sig Dispense Refill  . aspirin 81 MG tablet Take 81 mg by mouth daily. 2 tablets daily    . atorvastatin (LIPITOR) 80 MG tablet Take 80 mg by mouth daily.    Marland Kitchen buPROPion (WELLBUTRIN SR) 200 MG 12 hr tablet Take 1 tablet (200 mg total) by mouth 2 (two) times daily. 60 tablet 1  . calcium carbonate (TUMS EX) 750 MG chewable tablet Chew by mouth.    . Cholecalciferol (VITAMIN D-1000 MAX ST) 1000 UNITS tablet Take by mouth.    . DULoxetine (CYMBALTA) 60 MG capsule Take 1 capsule (60 mg total) by mouth daily. 90 capsule 0  . isosorbide mononitrate (IMDUR) 30 MG 24 hr tablet Take 30 mg by mouth daily.    Marland Kitchen levothyroxine (SYNTHROID, LEVOTHROID) 50 MCG tablet Take 50 mcg by mouth daily before breakfast.    . LORazepam (ATIVAN) 0.5 MG tablet Take 1 tablet (0.5 mg total) by mouth at bedtime. 10 tablet 0  . metoprolol succinate (TOPROL-XL) 50 MG 24 hr tablet Take 50 mg by mouth 2 (two) times daily. Take with or immediately following a meal.    . nitroGLYCERIN (NITROSTAT) 0.4 MG SL tablet Place 0.4 mg under the tongue every 5 (five) minutes as needed for chest pain.    . Omega-3 Fatty Acids (FISH OIL CONCENTRATE) 1000 MG CAPS Take 4,000 mg by mouth.    . traZODone (DESYREL) 50 MG tablet Take one tablet at bedtime as needed for insomnia. If needed can take two tablets at bedtime as needed for insomnia. 60 tablet 1   No current facility-administered medications for this visit.    Medical Decision Making:  Established Problem, Stable/Improving (1), Review of Medication Regimen & Side Effects (2) and Review of New Medication or Change in Dosage (2)  Treatment Plan Summary:Medication management and Plan   Major depressive  Disorder, recurrent severe-continue Cymbalta 60 mg daily. We'll continue the Wellbutrin SR 200 mg twice a day. [place] 90 day supplies to her mail order pharmacy of these medications.  Insomnia-patient still has a supply of her Ativan to use as needed for sleep. We discussed that it might be good to find another medication to use as needed. Risk and benefits of trazodone have been discussing patient's able consent. We'll start trazodone 50 mg at bedtime as needed for insomnia. She is also instructed that she can  take 100 mg at bedtime as needed.  Panic disorder-continue Ativan as discussed above.  Patient will follow up in 1 month. She feels like her mood is good and cites that due to finances she would like to discontinue her therapy.  [name] 03/14/2015, 2:51 PM

## 2015-03-21 ENCOUNTER — Other Ambulatory Visit: Payer: Self-pay

## 2015-03-21 MED ORDER — TRAZODONE HCL 50 MG PO TABS: ORAL_TABLET | ORAL | Status: DC

## 2015-03-21 NOTE — Telephone Encounter (Signed)
i called [facility] and asked when pt had rx filed. pt has rx filed on  03-14-15.  I requested that they delete the 1 refill left since pt is wanting a [facility] supply [facility]. spoke with Raynelle Chary and she said that she would delete the remaining refill.

## 2015-03-21 NOTE — Telephone Encounter (Signed)
received a fax requesting 90 day supply for trazodone. pt last seen on  03-14-15 next appt 05-10-15.

## 2015-04-12 ENCOUNTER — Emergency Department
Admission: EM | Admit: 2015-04-12 | Discharge: 2015-04-12 | Disposition: A | Discharge status: Home / Self Care | Payer: Medicare Other | Attending: Emergency Medicine | Admitting: Emergency Medicine

## 2015-04-12 ENCOUNTER — Encounter: Payer: Self-pay | Admitting: Emergency Medicine

## 2015-04-12 ENCOUNTER — Emergency Department: Payer: Medicare Other

## 2015-04-12 DIAGNOSIS — Z880 Allergy status to penicillin: Secondary | ICD-10-CM | POA: Diagnosis not present

## 2015-04-12 DIAGNOSIS — I10 Essential (primary) hypertension: Secondary | ICD-10-CM | POA: Diagnosis not present

## 2015-04-12 DIAGNOSIS — Z79899 Other long term (current) drug therapy: Secondary | ICD-10-CM | POA: Insufficient documentation

## 2015-04-12 DIAGNOSIS — W108XXA Fall (on) (from) other stairs and steps, initial encounter: Secondary | ICD-10-CM | POA: Insufficient documentation

## 2015-04-12 DIAGNOSIS — Z7982 Long term (current) use of aspirin: Secondary | ICD-10-CM | POA: Diagnosis not present

## 2015-04-12 DIAGNOSIS — M25531 Pain in right wrist: Secondary | ICD-10-CM

## 2015-04-12 DIAGNOSIS — Y998 Other external cause status: Secondary | ICD-10-CM | POA: Diagnosis not present

## 2015-04-12 DIAGNOSIS — Y9289 Other specified places as the place of occurrence of the external cause: Secondary | ICD-10-CM | POA: Insufficient documentation

## 2015-04-12 DIAGNOSIS — Y9301 Activity, walking, marching and hiking: Secondary | ICD-10-CM | POA: Insufficient documentation

## 2015-04-12 DIAGNOSIS — S6992XA Unspecified injury of left wrist, hand and finger(s), initial encounter: Secondary | ICD-10-CM | POA: Insufficient documentation

## 2015-04-12 NOTE — ED Notes (Signed)
Pt c/o tripping up some steps falling and injurying her left wrist and hand. Pt brought in by EMS.

## 2015-04-12 NOTE — ED Notes (Signed)
Pt states she tripped and fell, braced herself with her hands, complains of left wrist pain, no deformity noted, warm and dry, 2+ pulses noted

## 2015-04-12 NOTE — Discharge Instructions (Signed)
Please wear your wrist splint. Please follow-up with orthopedics in 1 week for repeat evaluation. Return to the [facility] department for any worsening pain.   Wrist Pain There are many things that can cause wrist pain. Some common causes include:  An injury to the wrist area, such as a sprain, strain, or fracture.  Overuse of the joint.  A condition that causes increased pressure on a nerve in the wrist (carpal tunnel syndrome).  Wear and tear of the joints that occurs with aging (osteoarthritis).  A variety of other types of arthritis. Sometimes, the cause of wrist pain is not known. The pain often goes away when you follow your health care provider's instructions for relieving pain at home. If your wrist pain continues, tests may need to be done to diagnose your condition. HOME CARE INSTRUCTIONS Pay attention to any changes in your symptoms. Take these actions to help with your pain:  Rest the wrist area for at least 48 hours or as told by your health care provider.  If directed, apply ice to the injured area:  Put ice in a plastic bag.  Place a towel between your skin and the bag.  Leave the ice on for 20 minutes, 2-3 times per day.  Keep your arm raised (elevated) above the level of your heart while you are sitting or lying down.  If a splint or elastic bandage has been applied, use it as told by your health care provider.  Remove the splint or bandage only as told by your health care provider.  Loosen the splint or bandage if your fingers become numb or have a tingling feeling, or if they turn cold or blue.  Take over-the-counter and prescription medicines only as told by your health care provider.  Keep all follow-up visits as told by your health care provider. This is important. SEEK MEDICAL CARE IF:  Your pain is not helped by treatment.  Your pain gets worse. SEEK IMMEDIATE MEDICAL CARE IF:  Your fingers become swollen.  Your fingers turn white, very red, or cold  and blue.  Your fingers are numb or have a tingling feeling.  You have difficulty moving your fingers.   This information is not intended to replace advice given to you by your health care provider. Make sure you discuss any questions you have with your health care provider.   Document Released: 02/20/2005 Document Revised: 02/01/2015 Document Reviewed: 09/28/2014 Elsevier Interactive Patient Education [employer].

## 2015-04-12 NOTE — ED Provider Notes (Signed)
[facility] Emergency Department Provider Note  Time seen: 11:30 AM  I have reviewed the triage vital signs and the nursing notes.   HISTORY  Chief Complaint Wrist Pain    HPI Lori Wagner is a 64 y.o. female with a past medical history of hypertension, rheumatoid arthritis, CAD status post CABG, who presents to the emergency department left wrist pain. According to the patient she was walking into the [facility] when she tripped falling forward landing on her left wrist. Denies loss of consciousness, denies any other pain. States she is having mild pain in the left wrist somewhat worse with movement.Does not take blood thinners.     Past Medical History  Diagnosis Date  . Arthritis   . RA (rheumatoid arthritis) ([place])   . Hypertension   . Thyroid disease   . Anxiety   . Coronary artery disease   . Fatigue     Patient Active Problem List   Diagnosis Date Noted  . Major depressive disorder, recurrent, severe without psychotic features ([place]) 12/21/2014  . Panic disorder 12/21/2014  . Impingement syndrome of shoulder 07/22/2014  . Infraspinatus tenosynovitis 07/22/2014  . Acquired hypothyroidism 07/12/2014  . Pain in shoulder 07/12/2014  . Clinical depression 07/12/2014  . Combined fat and carbohydrate induced hyperlipemia 07/12/2014    Past Surgical History  Procedure Laterality Date  . Coronary artery bypass graft    . Appendectomy    . Renal colic    . Cardiac surgery    . Gastric bypass    . Ankle surgery Left   . Hand surgery Right   . Bladder repair      Current Outpatient Rx  Name  Route  Sig  Dispense  Refill  . aspirin 81 MG tablet   Oral   Take 81 mg by mouth daily. 2 tablets daily         . atorvastatin (LIPITOR) 80 MG tablet   Oral   Take 80 mg by mouth daily.         Marland Kitchen buPROPion (WELLBUTRIN SR) 200 MG 12 hr tablet   Oral   Take 1 tablet (200 mg total) by mouth 2 (two) times daily.   180 tablet   0   . calcium  carbonate (TUMS EX) 750 MG chewable tablet   Oral   Chew by mouth.         . Cholecalciferol (VITAMIN D-1000 MAX ST) 1000 UNITS tablet   Oral   Take by mouth.         . DULoxetine (CYMBALTA) 60 MG capsule   Oral   Take 1 capsule (60 mg total) by mouth daily.   90 capsule   0   . isosorbide mononitrate (IMDUR) 30 MG 24 hr tablet   Oral   Take 30 mg by mouth daily.         Marland Kitchen levothyroxine (SYNTHROID, LEVOTHROID) 50 MCG tablet   Oral   Take 50 mcg by mouth daily before breakfast.         . LORazepam (ATIVAN) 0.5 MG tablet   Oral   Take 1 tablet (0.5 mg total) by mouth at bedtime.   10 tablet   0   . metoprolol succinate (TOPROL-XL) 50 MG 24 hr tablet   Oral   Take 50 mg by mouth 2 (two) times daily. Take with or immediately following a meal.         . nitroGLYCERIN (NITROSTAT) 0.4 MG SL tablet   Sublingual  Place 0.4 mg under the tongue every 5 (five) minutes as needed for chest pain.         . Omega-3 Fatty Acids (FISH OIL CONCENTRATE) 1000 MG CAPS   Oral   Take 4,000 mg by mouth.         . traZODone (DESYREL) 50 MG tablet      Take one tablet at bedtime as needed for insomnia. If needed can take two tablets at bedtime as needed for insomnia.   180 tablet   0     Allergies Ace inhibitors; Morphine and related; Penicillins; Sulfa antibiotics; and Cynara scolymus (artichoke)  Family History  Problem Relation Age of Onset  . Hypertension Mother   . Coronary artery disease Mother   . Arthritis Mother   . Osteoporosis Mother   . Depression Mother   . Anxiety disorder Mother   . Coronary artery disease Father   . Stroke Father   . Coronary artery disease Brother   . Coronary artery disease Maternal Aunt   . Osteoporosis Maternal Aunt   . Coronary artery disease Cousin   . Osteoporosis Cousin   . Bipolar disorder Brother   . Drug abuse Brother     Social History Social History  Substance Use Topics  . Smoking status: Never Smoker   .  Smokeless tobacco: Never Used  . Alcohol Use: No    Review of Systems Constitutional: Negative for fever. Cardiovascular: Negative for chest pain. Respiratory: Negative for shortness of breath. Gastrointestinal: Negative for abdominal pain Musculoskeletal: Negative for back pain. Negative for neck pain. Positive for left wrist pain. Neurological: Negative for headache 10-point ROS otherwise negative.  ____________________________________________   PHYSICAL EXAM:  VITAL SIGNS: ED Triage Vitals  Enc Vitals Group     BP 04/12/15 1055 98/72 mmHg     Pulse Rate 04/12/15 1055 64     Resp 04/12/15 1055 18     Temp 04/12/15 1055 97.4 F (36.3 C)     Temp Source 04/12/15 1055 Oral     SpO2 04/12/15 1055 100 %     Weight 04/12/15 1055 147 lb (66.679 kg)     Height 04/12/15 1055 5\' 5"  (1.651 m)     Head Cir --      Peak Flow --      Pain Score 04/12/15 1055 7     Pain Loc --      Pain Edu? --      Excl. in [place]? --     Constitutional: Alert and oriented. Well appearing and in no distress. Eyes: Normal exam ENT   Head: Normocephalic and atraumatic.   Mouth/Throat: Mucous membranes are moist. Cardiovascular: Normal rate, regular rhythm.  Respiratory: Normal respiratory effort without tachypnea nor retractions. Breath sounds are clear  Gastrointestinal: Soft and nontender.  Musculoskeletal: Minimal left wrist tenderness to palpation. Minimal snuffbox tenderness. Neurovascularly intact distally. No swelling. Neurologic:  Normal speech and language. No gross focal neurologic deficits  Skin:  Skin is warm, dry and intact.  Psychiatric: Mood and affect are normal. Speech and behavior are normal.   ____________________________________________    RADIOLOGY  X-ray negative  ____________________________________________   INITIAL IMPRESSION / ASSESSMENT AND PLAN / ED COURSE  Pertinent labs & imaging results that were available during my care of the patient were reviewed by  me and considered in my medical decision making (see chart for details).  Very mild tenderness palpation of the left wrist on exam. Otherwise atraumatic examination. We will check an  x-ray. If x-ray is negative we'll place the patient in a removable left wrist splint and have her follow-up with orthopedics in 1 week for repeat x-ray.  X-ray negative we will place a Velcro wrist splint and have the patient follow up with orthopedics in 1 week.  ____________________________________________   FINAL CLINICAL IMPRESSION(S) / ED DIAGNOSES  Left wrist pain Cheral Marker, MD 04/12/15 1226

## 2015-05-02 ENCOUNTER — Other Ambulatory Visit: Payer: Self-pay | Admitting: Psychiatry

## 2015-05-10 ENCOUNTER — Ambulatory Visit: Payer: Medicare Other | Admitting: Psychiatry

## 2015-05-17 ENCOUNTER — Telehealth: Payer: Self-pay

## 2015-05-17 ENCOUNTER — Ambulatory Visit (INDEPENDENT_AMBULATORY_CARE_PROVIDER_SITE_OTHER): Payer: 59 | Admitting: Psychiatry

## 2015-05-17 ENCOUNTER — Encounter: Payer: Self-pay | Admitting: Psychiatry

## 2015-05-17 VITALS — BP 122/68 | HR 98 | Temp 99.0°F | Ht 65.0 in | Wt 150.4 lb

## 2015-05-17 DIAGNOSIS — F332 Major depressive disorder, recurrent severe without psychotic features: Secondary | ICD-10-CM

## 2015-05-17 DIAGNOSIS — F410 Panic disorder [episodic paroxysmal anxiety] without agoraphobia: Secondary | ICD-10-CM | POA: Diagnosis not present

## 2015-05-17 DIAGNOSIS — G4700 Insomnia, unspecified: Secondary | ICD-10-CM

## 2015-05-17 MED ORDER — LORAZEPAM 0.5 MG PO TABS: 0.5000 mg | ORAL_TABLET | Freq: Every day | ORAL | Status: DC

## 2015-05-17 NOTE — Progress Notes (Signed)
BH MD/PA/NP OP Progress Note  05/17/2015 4:24 PM Lori Wagner  MRN:  [id number]  Subjective:  Patient returns for follow-up of her major depressive disorder, recurrent, severe without psychotic features and panic disorder. She states her main concern at this time is a tremor. She states that her cardiologist started decreasing her beta blocker and she noted she had a rather profound tremor. She states that she wonders whether the Wellbutrin has been causing tremor or perhaps it some type of "essential tremor" because she has a relative that does have essential tremor. I indicated the only way to be sure he to stop the Wellbutrin and observe the status of the tremor. So discussed that should she need augmentation of her Cymbalta that we might explore the atypical antipsychotic such as Abilify, Rexulti or Seroquel.  He indicated her mood is been up and down. She states that some days she is feeling okay and then she stated that 2 days ago she felt her depressed.  We had tried using trazodone to address her insomnia but she states it really was not effective and thus the lorazepam has been the only thing that largely he has worked for her. Chief Complaint: Tremor Chief Complaint    Follow-up; Medication Refill; Depression    angry Visit Diagnosis:   No diagnosis found.  Past Medical History:  Past Medical History  Diagnosis Date  . Arthritis   . RA (rheumatoid arthritis) ([place])   . Hypertension   . Thyroid disease   . Anxiety   . Coronary artery disease   . Fatigue     Past Surgical History  Procedure Laterality Date  . Coronary artery bypass graft    . Appendectomy    . Renal colic    . Cardiac surgery    . Gastric bypass    . Ankle surgery Left   . Hand surgery Right   . Bladder repair     Family History:  Family History  Problem Relation Age of Onset  . Hypertension Mother   . Coronary artery disease Mother   . Arthritis Mother   . Osteoporosis Mother   .  Depression Mother   . Anxiety disorder Mother   . Coronary artery disease Father   . Stroke Father   . Coronary artery disease Brother   . Coronary artery disease Maternal Aunt   . Osteoporosis Maternal Aunt   . Coronary artery disease Cousin   . Osteoporosis Cousin   . Bipolar disorder Brother   . Drug abuse Brother    Social History:  Social History   Social History  . Marital Status: Married    Spouse Name: N/A  . Number of Children: N/A  . Years of Education: N/A   Social History Main Topics  . Smoking status: Never Smoker   . Smokeless tobacco: Never Used  . Alcohol Use: No  . Drug Use: No  . Sexual Activity: No   Other Topics Concern  . None   Social History Narrative   Additional History:   Assessment:   Musculoskeletal: Strength & Muscle Tone: within normal limits Gait & Station: normal Patient leans: N/A  Psychiatric Specialty Exam: Depression        Associated symptoms include insomnia.  Associated symptoms include no suicidal ideas.   Review of Systems  Psychiatric/Behavioral: Positive for depression. Negative for suicidal ideas, hallucinations, memory loss and substance abuse. The patient has insomnia. The patient is not nervous/anxious.   All other systems reviewed and are  negative.   Blood pressure 122/68, pulse 98, temperature 99 F (37.2 C), temperature source Tympanic, height 5\' 5"  (1.651 m), weight 150 lb 6.4 oz (68.221 kg), SpO2 97 %.Body mass index is 25.03 kg/(m^2).  General Appearance: Well Groomed  Eye Contact:  Good  Speech:  Normal Rate  Volume:  Normal  Mood:  up and down  Affect:  Constricted  Thought Process:  Linear and Logical  Orientation:  Full (Time, Place, and Person)  Thought Content:  Negative  Suicidal Thoughts:  No  Homicidal Thoughts:  No  Memory:  Immediate;   Good Recent;   Good Remote;   Good  Judgement:  Good  Insight:  Good  Psychomotor Activity:  Negative  Concentration:  Good  Recall:  Good  Fund of  Knowledge: Good  Language: Good  Akathisia:  Negative  Handed:  Right  AIMS (if indicated):  N/A  Assets:  Communication Skills Desire for Improvement Social Support  ADL's:  Intact  Cognition: WNL  Sleep:  fair   Is the patient at risk to self?  No. Has the patient been a risk to self in the past 6 months?  No. Has the patient been a risk to self within the distant past?  No. Is the patient a risk to others?  No. Has the patient been a risk to others in the past 6 months?  No. Has the patient been a risk to others within the distant past?  No.  Current Medications: Current Outpatient Prescriptions  Medication Sig Dispense Refill  . aspirin 81 MG tablet Take 81 mg by mouth daily. 2 tablets daily    . atorvastatin (LIPITOR) 80 MG tablet Take 80 mg by mouth daily.    . calcium carbonate (TUMS EX) 750 MG chewable tablet Chew by mouth.    . Cholecalciferol (VITAMIN D-1000 MAX ST) 1000 UNITS tablet Take by mouth.    . DULoxetine (CYMBALTA) 60 MG capsule Take 1 capsule by mouth  daily 90 capsule 0  . isosorbide mononitrate (IMDUR) 30 MG 24 hr tablet Take 30 mg by mouth daily.    Marland Kitchen levothyroxine (SYNTHROID, LEVOTHROID) 50 MCG tablet Take 50 mcg by mouth daily before breakfast.    . LORazepam (ATIVAN) 0.5 MG tablet Take 1 tablet (0.5 mg total) by mouth at bedtime. 90 tablet 1  . metoprolol succinate (TOPROL-XL) 50 MG 24 hr tablet Take 50 mg by mouth 2 (two) times daily. Take with or immediately following a meal.    . nitroGLYCERIN (NITROSTAT) 0.4 MG SL tablet Place 0.4 mg under the tongue every 5 (five) minutes as needed for chest pain.    . Omega-3 Fatty Acids (FISH OIL CONCENTRATE) 1000 MG CAPS Take 4,000 mg by mouth.     No current facility-administered medications for this visit.    Medical Decision Making:  Established Problem, Stable/Improving (1), Review of Medication Regimen & Side Effects (2) and Review of New Medication or Change in Dosage (2)  Treatment Plan  Summary:Medication management and Plan   Major depressive Disorder, recurrent severe-continue Cymbalta 60 mg daily. She will taper off the Wellbutrin SR. She's going to take 200 mg in the morning for 7 days and then discontinue the Wellbutrin SR. She will observe for the status of her tremor. Depending on those results we will either return to Wellbutrin or potentially augment with an atypical such as Abilify.  Insomnia-continue the lorazepam 0.5 mg at bedtime as needed for insomnia. We will discontinue the trazodone is patient found  it ineffective.  Panic disorder-continue Ativan as discussed above.  Patient will follow up in 1 month. I've discussed that I will be departing the practice in February and that she will be to continue with another provider within [facility].   [name] 05/17/2015, 4:24 PM

## 2015-05-17 NOTE — Telephone Encounter (Signed)
rx for ativan .5mg  take 1 tablet by mouth at bedtime faxed and confirmed id [id number] order# [id number]

## 2015-05-17 NOTE — Patient Instructions (Signed)
Take Wellbutrin SR 200 mg in the morning for seven days and then discontinue Wellbutrin SR.

## 2015-05-23 ENCOUNTER — Other Ambulatory Visit: Payer: Self-pay | Admitting: Psychiatry

## 2015-05-23 NOTE — Progress Notes (Signed)
05-17-15 discontinued  Spoke with Ho[facility] at [facility]

## 2015-05-23 NOTE — Progress Notes (Signed)
rx refilled.

## 2015-06-14 ENCOUNTER — Ambulatory Visit (INDEPENDENT_AMBULATORY_CARE_PROVIDER_SITE_OTHER): Payer: 59 | Admitting: Psychiatry

## 2015-06-14 ENCOUNTER — Encounter: Payer: Self-pay | Admitting: Psychiatry

## 2015-06-14 VITALS — BP 112/78 | HR 83 | Temp 98.1°F | Ht 65.0 in | Wt 154.6 lb

## 2015-06-14 DIAGNOSIS — F332 Major depressive disorder, recurrent severe without psychotic features: Secondary | ICD-10-CM

## 2015-06-14 DIAGNOSIS — G4700 Insomnia, unspecified: Secondary | ICD-10-CM | POA: Diagnosis not present

## 2015-06-14 MED ORDER — DULOXETINE HCL 60 MG PO CPEP: 60.0000 mg | ORAL_CAPSULE | Freq: Every day | ORAL | Status: DC

## 2015-06-14 NOTE — Progress Notes (Signed)
BH MD/PA/NP OP Progress Note  06/14/2015 2:31 PM Lori Wagner  MRN:  [id number]  Subjective:  Patient returns for follow-up of her major depressive disorder, recurrent, severe without psychotic features and panic disorder. Today she states that she has not noticed any changes in her mood or worsening of her mood since she discontinued Wellbutrin on the last visit. At that visit she was discussing a tremor that she was having and we wanted to assess whether Wellbutrin was contributing to it. She states that really since discontinuing the Wellbutrin there hasn't been any significant change in the tremor.  His overall her mood has been fair. States she has some bad days and good days but she does believe she is having more good days than bad. She states that she has engaged in light therapy and she is using the light box in the morning for half an hour to an hour a day. She states she's been using for about 2 weeks and has not noticed a dramatic difference. To engage in some activities with her dog and that she's been doing some obedience training with her dog.  At this time she stating that insomnia continues to be an issue for her. She states she continues to use her Ativan at night and that sometimes it works and other times it does not. Chief Complaint: sleep Chief Complaint    Follow-up; Medication Refill    angry Visit Diagnosis:     ICD-9-CM ICD-10-CM   1. Major depressive disorder, recurrent, severe without psychotic features ([place]) 296.33 F33.2   2. Insomnia 780.52 G47.00     Past Medical History:  Past Medical History  Diagnosis Date  . Arthritis   . RA (rheumatoid arthritis) ([place])   . Hypertension   . Thyroid disease   . Anxiety   . Coronary artery disease   . Fatigue     Past Surgical History  Procedure Laterality Date  . Coronary artery bypass graft    . Appendectomy    . Renal colic    . Cardiac surgery    . Gastric bypass    . Ankle surgery Left   . Hand  surgery Right   . Bladder repair     Family History:  Family History  Problem Relation Age of Onset  . Hypertension Mother   . Coronary artery disease Mother   . Arthritis Mother   . Osteoporosis Mother   . Depression Mother   . Anxiety disorder Mother   . Coronary artery disease Father   . Stroke Father   . Coronary artery disease Brother   . Coronary artery disease Maternal Aunt   . Osteoporosis Maternal Aunt   . Coronary artery disease Cousin   . Osteoporosis Cousin   . Bipolar disorder Brother   . Drug abuse Brother    Social History:  Social History   Social History  . Marital Status: Married    Spouse Name: N/A  . Number of Children: N/A  . Years of Education: N/A   Social History Main Topics  . Smoking status: Never Smoker   . Smokeless tobacco: Never Used  . Alcohol Use: No  . Drug Use: No  . Sexual Activity: No   Other Topics Concern  . None   Social History Narrative   Additional History:   Assessment:   Musculoskeletal: Strength & Muscle Tone: within normal limits Gait & Station: normal Patient leans: N/A  Psychiatric Specialty Exam: Depression  Associated symptoms include insomnia.  Associated symptoms include no suicidal ideas.   Review of Systems  Psychiatric/Behavioral: Positive for depression (at baseline and reporting more good days than bad). Negative for suicidal ideas, hallucinations, memory loss and substance abuse. The patient has insomnia. The patient is not nervous/anxious.   All other systems reviewed and are negative.   Blood pressure 112/78, pulse 83, temperature 98.1 F (36.7 C), temperature source Tympanic, height 5\' 5"  (1.651 m), weight 154 lb 9.6 oz (70.126 kg), SpO2 97 %.Body mass index is 25.73 kg/(m^2).  General Appearance: Well Groomed  Eye Contact:  Good  Speech:  Normal Rate  Volume:  Normal  Mood:  up and down  Affect:  Slightly bright, able to smile  Thought Process:  Linear and Logical  Orientation:   Full (Time, Place, and Person)  Thought Content:  Negative  Suicidal Thoughts:  No  Homicidal Thoughts:  No  Memory:  Immediate;   Good Recent;   Good Remote;   Good  Judgement:  Good  Insight:  Good  Psychomotor Activity:  Negative  Concentration:  Good  Recall:  Good  Fund of Knowledge: Good  Language: Good  Akathisia:  Negative  Handed:  Right  AIMS (if indicated):  N/A  Assets:  Communication Skills Desire for Improvement Social Support  ADL's:  Intact  Cognition: WNL  Sleep:  fair   Is the patient at risk to self?  No. Has the patient been a risk to self in the past 6 months?  No. Has the patient been a risk to self within the distant past?  No. Is the patient a risk to others?  No. Has the patient been a risk to others in the past 6 months?  No. Has the patient been a risk to others within the distant past?  No.  Current Medications: Current Outpatient Prescriptions  Medication Sig Dispense Refill  . aspirin 81 MG tablet Take 81 mg by mouth daily. 2 tablets daily    . atorvastatin (LIPITOR) 80 MG tablet Take 80 mg by mouth daily.    . Cholecalciferol (VITAMIN D-1000 MAX ST) 1000 UNITS tablet Take by mouth.    . DULoxetine (CYMBALTA) 60 MG capsule Take 1 capsule (60 mg total) by mouth daily. 90 capsule 0  . isosorbide mononitrate (IMDUR) 30 MG 24 hr tablet Take 30 mg by mouth daily.    Marland Kitchen levothyroxine (SYNTHROID, LEVOTHROID) 50 MCG tablet Take 50 mcg by mouth daily before breakfast.    . LORazepam (ATIVAN) 0.5 MG tablet Take 1 tablet (0.5 mg total) by mouth at bedtime. 90 tablet 1  . metoprolol succinate (TOPROL-XL) 50 MG 24 hr tablet Take 50 mg by mouth 2 (two) times daily. Take with or immediately following a meal.    . nitroGLYCERIN (NITROSTAT) 0.4 MG SL tablet Place 0.4 mg under the tongue every 5 (five) minutes as needed for chest pain.    . Omega-3 Fatty Acids (FISH OIL CONCENTRATE) 1000 MG CAPS Take 4,000 mg by mouth.     No current facility-administered  medications for this visit.    Medical Decision Making:  Established Problem, Stable/Improving (1), Review of Medication Regimen & Side Effects (2) and Review of New Medication or Change in Dosage (2)  Treatment Plan Summary:Medication management and Plan   Major depressive Disorder, recurrent severe-continue Cymbalta 60 mg daily. She has arty tried higher doses of Cymbalta and found that it did not improve her mood or pain issues (i.e. coccyx pain).  Insomnia-continue the lorazepam 0.5 mg at bedtime as needed for insomnia. We will discontinue the trazodone is patient found it ineffective.  Panic disorder-continue Ativan as discussed above.  Patient will follow up in 3 months. A show and request a referral to Dr. Waylan Boga office. Should she get accepted into that practice she will follow up there instead of in this practice. He is aware I will be departing the practice in February and that she will be able to continue with another provider within [facility] or as noted above she will pursue referral with Dr. Waylan Boga office.  [name] 06/14/2015, 2:31 PM

## 2015-06-28 ENCOUNTER — Other Ambulatory Visit: Payer: Self-pay | Admitting: Family Medicine

## 2015-06-28 DIAGNOSIS — Z1231 Encounter for screening mammogram for malignant neoplasm of breast: Secondary | ICD-10-CM

## 2015-07-03 DIAGNOSIS — R202 Paresthesia of skin: Secondary | ICD-10-CM | POA: Insufficient documentation

## 2015-07-03 DIAGNOSIS — M2550 Pain in unspecified joint: Secondary | ICD-10-CM | POA: Insufficient documentation

## 2015-07-03 DIAGNOSIS — R5382 Chronic fatigue, unspecified: Secondary | ICD-10-CM

## 2015-07-03 DIAGNOSIS — D8989 Other specified disorders involving the immune mechanism, not elsewhere classified: Secondary | ICD-10-CM | POA: Insufficient documentation

## 2015-07-03 DIAGNOSIS — M5136 Other intervertebral disc degeneration, lumbar region: Secondary | ICD-10-CM | POA: Insufficient documentation

## 2015-07-03 DIAGNOSIS — G9332 Myalgic encephalomyelitis/chronic fatigue syndrome: Secondary | ICD-10-CM | POA: Insufficient documentation

## 2015-07-04 ENCOUNTER — Other Ambulatory Visit: Payer: Self-pay | Admitting: Psychiatry

## 2015-07-10 ENCOUNTER — Other Ambulatory Visit: Payer: Self-pay | Admitting: Family Medicine

## 2015-07-10 ENCOUNTER — Ambulatory Visit
Admission: RE | Admit: 2015-07-10 | Discharge: 2015-07-10 | Disposition: A | Discharge status: Home / Self Care | Payer: Medicare Other | Source: Ambulatory Visit | Attending: Family Medicine | Admitting: Family Medicine

## 2015-07-10 DIAGNOSIS — Z1231 Encounter for screening mammogram for malignant neoplasm of breast: Secondary | ICD-10-CM | POA: Insufficient documentation

## 2015-08-07 ENCOUNTER — Encounter: Payer: Self-pay | Admitting: Psychiatry

## 2015-08-07 ENCOUNTER — Ambulatory Visit (INDEPENDENT_AMBULATORY_CARE_PROVIDER_SITE_OTHER): Payer: 59 | Admitting: Psychiatry

## 2015-08-07 DIAGNOSIS — F410 Panic disorder [episodic paroxysmal anxiety] without agoraphobia: Secondary | ICD-10-CM

## 2015-08-07 DIAGNOSIS — F332 Major depressive disorder, recurrent severe without psychotic features: Secondary | ICD-10-CM

## 2015-08-07 MED ORDER — BUPROPION HCL ER (XL) 150 MG PO TB24: 150.0000 mg | ORAL_TABLET | Freq: Every day | ORAL | Status: DC

## 2015-08-07 MED ORDER — LORAZEPAM 0.5 MG PO TABS: 0.5000 mg | ORAL_TABLET | Freq: Every day | ORAL | Status: DC

## 2015-08-07 NOTE — Progress Notes (Signed)
Patient ID: Lori Wagner, female   DOB: Sep 05, 1950, 65 y.o.   MRN: [id number] [facility] MD/PA/NP OP Progress Note  08/07/2015 2:07 PM Lori Wagner  MRN:  [id number]  Subjective:  Patient returns for follow-up of her major depressive disorder, recurrent, severe without psychotic features and panic disorder. She was previously seen by Dr. Jimmye Norman. Today is the first visit for this patient with this physician. Her overall her mood has been fair. States her mood is much better, states that since the days are longer now, mood has improved. Sleeping better now. She reports that she added that the Wellbutrin at 150 mg since she was eating more on the Cymbalta, and had gained a few pounds over the last few months. States that she has no side effects on the medications and is doing well. She is enjoying making jewelry and also trying to write a book. Denies any suicidal thoughts.    Chief Complaint: doing well Chief Complaint    Follow-up; Medication Refill    angry Visit Diagnosis:   No diagnosis found.  Past Medical History:  Past Medical History  Diagnosis Date  . Arthritis   . RA (rheumatoid arthritis) ([place])   . Hypertension   . Thyroid disease   . Anxiety   . Coronary artery disease   . Fatigue     Past Surgical History  Procedure Laterality Date  . Coronary artery bypass graft    . Appendectomy    . Renal colic    . Cardiac surgery    . Gastric bypass    . Ankle surgery Left   . Hand surgery Right   . Bladder repair     Family History:  Family History  Problem Relation Age of Onset  . Hypertension Mother   . Coronary artery disease Mother   . Arthritis Mother   . Osteoporosis Mother   . Depression Mother   . Anxiety disorder Mother   . Coronary artery disease Father   . Stroke Father   . Coronary artery disease Brother   . Coronary artery disease Maternal Aunt   . Osteoporosis Maternal Aunt   . Coronary artery disease Cousin   . Osteoporosis Cousin   .  Bipolar disorder Brother   . Drug abuse Brother    Social History:  Social History   Social History  . Marital Status: Married    Spouse Name: N/A  . Number of Children: N/A  . Years of Education: N/A   Social History Main Topics  . Smoking status: Never Smoker   . Smokeless tobacco: Never Used  . Alcohol Use: No  . Drug Use: No  . Sexual Activity: No   Other Topics Concern  . None   Social History Narrative   Additional History:   Assessment:   Musculoskeletal: Strength & Muscle Tone: within normal limits Gait & Station: normal Patient leans: N/A  Psychiatric Specialty Exam: Depression        Associated symptoms include insomnia.  Associated symptoms include no suicidal ideas.   Review of Systems  Psychiatric/Behavioral: Positive for depression (at baseline and reporting more good days than bad). Negative for suicidal ideas, hallucinations, memory loss and substance abuse. The patient has insomnia. The patient is not nervous/anxious.   All other systems reviewed and are negative.   Blood pressure 122/78, pulse 79, temperature 97 F (36.1 C), temperature source Tympanic, height 5\' 5"  (1.651 m), weight 153 lb 3.2 oz (69.491 kg), SpO2 99 %.Body mass index is 25.49  kg/(m^2).  General Appearance: Well Groomed  Eye Contact:  Good  Speech:  Normal Rate  Volume:  Normal  Mood:  up and down  Affect:  Slightly bright, able to smile  Thought Process:  Linear and Logical  Orientation:  Full (Time, Place, and Person)  Thought Content:  Negative  Suicidal Thoughts:  No  Homicidal Thoughts:  No  Memory:  Immediate;   Good Recent;   Good Remote;   Good  Judgement:  Good  Insight:  Good  Psychomotor Activity:  Negative  Concentration:  Good  Recall:  Good  Fund of Knowledge: Good  Language: Good  Akathisia:  Negative  Handed:  Right  AIMS (if indicated):  N/A  Assets:  Communication Skills Desire for Improvement Social Support  ADL's:  Intact  Cognition: WNL   Sleep:  fair   Is the patient at risk to self?  No. Has the patient been a risk to self in the past 6 months?  No. Has the patient been a risk to self within the distant past?  No. Is the patient a risk to others?  No. Has the patient been a risk to others in the past 6 months?  No. Has the patient been a risk to others within the distant past?  No.  Current Medications: Current Outpatient Prescriptions  Medication Sig Dispense Refill  . aspirin 81 MG tablet Take 81 mg by mouth daily. 2 tablets daily    . atorvastatin (LIPITOR) 80 MG tablet Take 80 mg by mouth daily.    Marland Kitchen buPROPion (WELLBUTRIN XL) 150 MG 24 hr tablet Take 150 mg by mouth daily.    . Cholecalciferol (VITAMIN D-1000 MAX ST) 1000 UNITS tablet Take by mouth.    . DULoxetine (CYMBALTA) 60 MG capsule Take 1 capsule (60 mg total) by mouth daily. 90 capsule 0  . isosorbide mononitrate (IMDUR) 30 MG 24 hr tablet Take 30 mg by mouth daily.    Marland Kitchen levothyroxine (SYNTHROID, LEVOTHROID) 50 MCG tablet Take 50 mcg by mouth daily before breakfast.    . LORazepam (ATIVAN) 0.5 MG tablet Take 1 tablet (0.5 mg total) by mouth at bedtime. 90 tablet 1  . metoprolol succinate (TOPROL-XL) 50 MG 24 hr tablet Take 50 mg by mouth 2 (two) times daily. Take with or immediately following a meal.    . nitroGLYCERIN (NITROSTAT) 0.4 MG SL tablet Place 0.4 mg under the tongue every 5 (five) minutes as needed for chest pain.    . Omega-3 Fatty Acids (FISH OIL CONCENTRATE) 1000 MG CAPS Take 4,000 mg by mouth.     No current facility-administered medications for this visit.    Medical Decision Making:  Established Problem, Stable/Improving (1), Review of Medication Regimen & Side Effects (2) and Review of New Medication or Change in Dosage (2)  Treatment Plan Summary:Medication management and Plan   Major depressive Disorder, recurrent severe-continue Cymbalta 60 mg daily. Continue Wellbutrin at 150 mg once daily   Insomnia-continue the lorazepam 0.5  mg at bedtime as needed for insomnia.   Panic disorder-continue Ativan as discussed above.  Patient will follow up in 2 months.   Delene Morais 08/07/2015, 2:07 PM

## 2015-10-05 ENCOUNTER — Emergency Department: Payer: Medicare Other

## 2015-10-05 ENCOUNTER — Emergency Department
Admission: EM | Admit: 2015-10-05 | Discharge: 2015-10-05 | Disposition: A | Discharge status: Home / Self Care | Payer: Medicare Other | Attending: Emergency Medicine | Admitting: Emergency Medicine

## 2015-10-05 ENCOUNTER — Encounter: Payer: Self-pay | Admitting: Emergency Medicine

## 2015-10-05 DIAGNOSIS — R002 Palpitations: Secondary | ICD-10-CM

## 2015-10-05 DIAGNOSIS — Z880 Allergy status to penicillin: Secondary | ICD-10-CM | POA: Insufficient documentation

## 2015-10-05 DIAGNOSIS — M5136 Other intervertebral disc degeneration, lumbar region: Secondary | ICD-10-CM | POA: Insufficient documentation

## 2015-10-05 DIAGNOSIS — E039 Hypothyroidism, unspecified: Secondary | ICD-10-CM | POA: Diagnosis not present

## 2015-10-05 DIAGNOSIS — M069 Rheumatoid arthritis, unspecified: Secondary | ICD-10-CM | POA: Diagnosis not present

## 2015-10-05 DIAGNOSIS — I10 Essential (primary) hypertension: Secondary | ICD-10-CM | POA: Diagnosis not present

## 2015-10-05 DIAGNOSIS — Z7982 Long term (current) use of aspirin: Secondary | ICD-10-CM | POA: Insufficient documentation

## 2015-10-05 DIAGNOSIS — Z951 Presence of aortocoronary bypass graft: Secondary | ICD-10-CM | POA: Insufficient documentation

## 2015-10-05 DIAGNOSIS — F322 Major depressive disorder, single episode, severe without psychotic features: Secondary | ICD-10-CM | POA: Insufficient documentation

## 2015-10-05 LAB — CBC
HEMATOCRIT: 41.9 % (ref 35.0–47.0)
HEMOGLOBIN: 14.3 g/dL (ref 12.0–16.0)
MCH: 32.3 pg (ref 26.0–34.0)
MCHC: 34 g/dL (ref 32.0–36.0)
MCV: 95.1 fL (ref 80.0–100.0)
PLATELETS: 226 10*3/uL (ref 150–440)
RBC: 4.41 MIL/uL (ref 3.80–5.20)
RDW: 12.7 % (ref 11.5–14.5)
WBC: 5.4 10*3/uL (ref 3.6–11.0)

## 2015-10-05 LAB — BASIC METABOLIC PANEL
ANION GAP: 6 (ref 5–15)
BUN: 21 mg/dL — ABNORMAL HIGH (ref 6–20)
CHLORIDE: 110 mmol/L (ref 101–111)
CO2: 27 mmol/L (ref 22–32)
CREATININE: 0.78 mg/dL (ref 0.44–1.00)
Calcium: 9.2 mg/dL (ref 8.9–10.3)
GFR calc non Af Amer: >60 mL/min (ref >60)
Glucose, Bld: 153 mg/dL — ABNORMAL HIGH (ref 65–99)
POTASSIUM: 3.9 mmol/L (ref 3.5–5.1)
SODIUM: 143 mmol/L (ref 135–145)

## 2015-10-05 LAB — TROPONIN I
Troponin I: <0.03 ng/mL (ref <0.031)
Troponin I: <0.03 ng/mL (ref <0.031)

## 2015-10-05 NOTE — ED Notes (Signed)
Pt presents with fatigue and palpatations started today. Recently had some blood pressure meds changed.

## 2015-10-05 NOTE — ED Provider Notes (Signed)
[facility] Emergency Department Provider Note  ____________________________________________    I have reviewed the triage vital signs and the nursing notes.   HISTORY  Chief Complaint Irregular Heart Beat    HPI Lori Wagner is a 65 y.o. female who presents with complaints of feeling fatigued and having palpitations earlier today. These resolved while she was in the waiting room. She denies chest pain. Denies fevers chills or cough. No shortness of breath. No recent travel. She reports a history of SVT for which she is on metoprolol. She reports her heart rate was as high as 95 when she was feeling palpitations. Currently she feels well     Past Medical History  Diagnosis Date  . Arthritis   . RA (rheumatoid arthritis) ([place])   . Hypertension   . Thyroid disease   . Anxiety   . Coronary artery disease   . Fatigue     Patient Active Problem List   Diagnosis Date Noted  . Arthralgia of multiple joints 07/03/2015  . CFIDS (chronic fatigue and immune dysfunction syndrome) 07/03/2015  . Degeneration of intervertebral disc of lumbar region 07/03/2015  . Hand paresthesia 07/03/2015  . Major depressive disorder, recurrent, severe without psychotic features ([place]) 12/21/2014  . Panic disorder 12/21/2014  . Impingement syndrome of shoulder 07/22/2014  . Infraspinatus tenosynovitis 07/22/2014  . Impingement syndrome of left shoulder 07/22/2014  . Other synovitis and tenosynovitis, left shoulder 07/22/2014  . Acquired hypothyroidism 07/12/2014  . Pain in shoulder 07/12/2014  . Clinical depression 07/12/2014  . Combined fat and carbohydrate induced hyperlipemia 07/12/2014    Past Surgical History  Procedure Laterality Date  . Coronary artery bypass graft    . Appendectomy    . Renal colic    . Cardiac surgery    . Gastric bypass    . Ankle surgery Left   . Hand surgery Right   . Bladder repair      Current Outpatient Rx  Name  Route  Sig   Dispense  Refill  . aspirin 81 MG tablet   Oral   Take 81 mg by mouth daily. 2 tablets daily         . atorvastatin (LIPITOR) 80 MG tablet   Oral   Take 80 mg by mouth daily.         Marland Kitchen buPROPion (WELLBUTRIN XL) 150 MG 24 hr tablet   Oral   Take 1 tablet (150 mg total) by mouth daily.   30 tablet   1   . Cholecalciferol (VITAMIN D-1000 MAX ST) 1000 UNITS tablet   Oral   Take by mouth.         . DULoxetine (CYMBALTA) 60 MG capsule   Oral   Take 1 capsule (60 mg total) by mouth daily.   90 capsule   0     HOLD UNTIL PATIENT IS DUE FOR A REFILL   . isosorbide mononitrate (IMDUR) 30 MG 24 hr tablet   Oral   Take 30 mg by mouth daily.         Marland Kitchen levothyroxine (SYNTHROID, LEVOTHROID) 50 MCG tablet   Oral   Take 50 mcg by mouth daily before breakfast.         . LORazepam (ATIVAN) 0.5 MG tablet   Oral   Take 1 tablet (0.5 mg total) by mouth at bedtime.   90 tablet   1   . metoprolol succinate (TOPROL-XL) 50 MG 24 hr tablet   Oral   Take  50 mg by mouth 2 (two) times daily. Take with or immediately following a meal.         . nitroGLYCERIN (NITROSTAT) 0.4 MG SL tablet   Sublingual   Place 0.4 mg under the tongue every 5 (five) minutes as needed for chest pain.         . Omega-3 Fatty Acids (FISH OIL CONCENTRATE) 1000 MG CAPS   Oral   Take 4,000 mg by mouth.           Allergies Epinephrine; Ace inhibitors; Morphine and related; Penicillins; Sulfa antibiotics; and Cynara scolymus (artichoke)  Family History  Problem Relation Age of Onset  . Hypertension Mother   . Coronary artery disease Mother   . Arthritis Mother   . Osteoporosis Mother   . Depression Mother   . Anxiety disorder Mother   . Coronary artery disease Father   . Stroke Father   . Coronary artery disease Brother   . Coronary artery disease Maternal Aunt   . Osteoporosis Maternal Aunt   . Coronary artery disease Cousin   . Osteoporosis Cousin   . Bipolar disorder Brother   . Drug  abuse Brother     Social History Social History  Substance Use Topics  . Smoking status: Never Smoker   . Smokeless tobacco: Never Used  . Alcohol Use: No    Review of Systems  Constitutional: Negative for fever. Eyes: Negative for Change in vision  Cardiovascular: Negative for chest pain, Positive for palpitations Respiratory: Negative for shortness of breath. Gastrointestinal: Negative for abdominal pain Genitourinary: Negative for dysuria. Musculoskeletal: Negative for back pain. Skin: Negative for pallor Neurological: Negative for focal weakness, no headache Psychiatric: no anxiety    ____________________________________________   PHYSICAL EXAM:  VITAL SIGNS: ED Triage Vitals  Enc Vitals Group     BP 10/05/15 1556 96/67 mmHg     Pulse Rate 10/05/15 1556 85     Resp 10/05/15 1556 18     Temp 10/05/15 1556 97.9 F (36.6 C)     Temp src --      SpO2 10/05/15 1556 95 %     Weight 10/05/15 1556 147 lb (66.679 kg)     Height 10/05/15 1556 5\' 5"  (1.651 m)     Head Cir --      Peak Flow --      Pain Score --      Pain Loc --      Pain Edu? --      Excl. in [place]? --     Constitutional: Alert and oriented. Well appearing and in no distress.  Eyes: Conjunctivae are normal. No erythema or injection ENT   Head: Normocephalic and atraumatic.   Mouth/Throat: Mucous membranes are moist. Cardiovascular: Normal rate, regular rhythm. Normal and symmetric distal pulses are present in the upper extremities. No murmurs or rubs  Respiratory: Normal respiratory effort without tachypnea nor retractions. Breath sounds are clear and equal bilaterally.  Gastrointestinal: Soft and non-tender in all quadrants. No distention.  Genitourinary: deferred Musculoskeletal: Nontender with normal range of motion in all extremities. No lower extremity tenderness nor edema. Neurologic:  Normal speech and language. No gross focal neurologic deficits are appreciated. Skin:  Skin is warm,  dry and intact. No rash noted. Psychiatric: Mood and affect are normal. Patient exhibits appropriate insight and judgment.  ____________________________________________    LABS (pertinent positives/negatives)  Labs Reviewed  BASIC METABOLIC PANEL - Abnormal; Notable for the following:    Glucose, Bld 153 (*)  BUN 21 (*)    All other components within normal limits  CBC  TROPONIN I  TROPONIN I    ____________________________________________   EKG  ED ECG REPORT I, Lavonia Drafts, the attending physician, personally viewed and interpreted this ECG.  Date: 10/05/2015 EKG Time: 3:49 PM Rate: 84 Rhythm: normal sinus rhythm QRS Axis: normal Intervals: normal ST/T Wave abnormalities: normal Conduction Disturbances: Right bundle branch block    ____________________________________________    RADIOLOGY  chest x-ray unremarkable  ____________________________________________   PROCEDURES  Procedure(s) performed: none  Critical Care performed: none  ____________________________________________   INITIAL IMPRESSION / ASSESSMENT AND PLAN / ED COURSE  Pertinent labs & imaging results that were available during my care of the patient were reviewed by me and considered in my medical decision making (see chart for details).  Patient well-appearing in no distress. Her heart rate is normal in the emergency department. She has no chest pain. Her EKG is unremarkable. We will check troponin 2 but given the patient's max heart rate was in the 90s I am reassured that patient will be appropriate for outpatient follow-up.   ____________________________________________   FINAL CLINICAL IMPRESSION(S) / ED DIAGNOSES  Final diagnoses:  Palpitations          Lavonia Drafts, MD 10/05/15 2157

## 2015-10-05 NOTE — Discharge Instructions (Signed)

## 2015-10-05 NOTE — ED Notes (Signed)
Pt is resting inbed states fatigue and dizziness only right now. Pt reported all day with nausea dizzy weakness palpations continued to get worse wouldn't go away.

## 2015-10-09 ENCOUNTER — Ambulatory Visit: Payer: Medicare Other | Admitting: Psychiatry

## 2015-10-13 DIAGNOSIS — G5603 Carpal tunnel syndrome, bilateral upper limbs: Secondary | ICD-10-CM | POA: Insufficient documentation

## 2015-10-13 DIAGNOSIS — G5600 Carpal tunnel syndrome, unspecified upper limb: Secondary | ICD-10-CM | POA: Insufficient documentation

## 2015-10-16 ENCOUNTER — Ambulatory Visit (INDEPENDENT_AMBULATORY_CARE_PROVIDER_SITE_OTHER): Payer: 59 | Admitting: Psychiatry

## 2015-10-16 ENCOUNTER — Encounter: Payer: Self-pay | Admitting: Psychiatry

## 2015-10-16 VITALS — BP 122/78 | HR 80 | Temp 98.1°F | Ht 65.0 in | Wt 153.6 lb

## 2015-10-16 DIAGNOSIS — F332 Major depressive disorder, recurrent severe without psychotic features: Secondary | ICD-10-CM | POA: Diagnosis not present

## 2015-10-16 DIAGNOSIS — G4700 Insomnia, unspecified: Secondary | ICD-10-CM | POA: Diagnosis not present

## 2015-10-16 MED ORDER — BUPROPION HCL ER (XL) 150 MG PO TB24: 150.0000 mg | ORAL_TABLET | Freq: Every day | ORAL | Status: DC

## 2015-10-16 NOTE — Progress Notes (Signed)
Patient ID: Lori Wagner, female   DOB: 04-17-1951, 65 y.o.   MRN: [id number] [facility] MD/PA/NP OP Progress Note  10/16/2015 10:52 AM Lori Wagner  MRN:  [id number]  Subjective:  Patient returns for follow-up of her major depressive disorder, recurrent, severe without psychotic features and panic disorder. Patient reports that overall she has been doing okay. However she is been upset over the last 2 weeks and she broke up with her friend of 30 years. States that she and this friend have had their political differences and her friend got very upset over something that the patient had said. Patient's states that she is upset about it but now feels like the friendship is beyond repair. Overall she says she is been sleeping well and eating well. She states her mood has been more stable. States that she and her husband are remodeling the kitchen and it looks nice and she is looking forward to that. Denies any suicidal thoughts.   Chief Complaint: doing well Chief Complaint    Follow-up; Medication Refill    angry Visit Diagnosis:     ICD-9-CM ICD-10-CM   1. Major depressive disorder, recurrent, severe without psychotic features ([place]) 296.33 F33.2   2. Insomnia 780.52 G47.00     Past Medical History:  Past Medical History  Diagnosis Date  . Arthritis   . RA (rheumatoid arthritis) ([place])   . Hypertension   . Thyroid disease   . Anxiety   . Coronary artery disease   . Fatigue     Past Surgical History  Procedure Laterality Date  . Coronary artery bypass graft    . Appendectomy    . Renal colic    . Cardiac surgery    . Gastric bypass    . Ankle surgery Left   . Hand surgery Right   . Bladder repair     Family History:  Family History  Problem Relation Age of Onset  . Hypertension Mother   . Coronary artery disease Mother   . Arthritis Mother   . Osteoporosis Mother   . Depression Mother   . Anxiety disorder Mother   . Coronary artery disease Father   . Stroke Father    . Coronary artery disease Brother   . Coronary artery disease Maternal Aunt   . Osteoporosis Maternal Aunt   . Coronary artery disease Cousin   . Osteoporosis Cousin   . Bipolar disorder Brother   . Drug abuse Brother    Social History:  Social History   Social History  . Marital Status: Married    Spouse Name: N/A  . Number of Children: N/A  . Years of Education: N/A   Social History Main Topics  . Smoking status: Never Smoker   . Smokeless tobacco: Never Used  . Alcohol Use: No  . Drug Use: No  . Sexual Activity: No   Other Topics Concern  . None   Social History Narrative   Additional History:   Assessment:   Musculoskeletal: Strength & Muscle Tone: within normal limits Gait & Station: normal Patient leans: N/A  Psychiatric Specialty Exam: Depression        Associated symptoms include insomnia.  Associated symptoms include no suicidal ideas.   Review of Systems  Psychiatric/Behavioral: Positive for depression (at baseline and reporting more good days than bad). Negative for suicidal ideas, hallucinations, memory loss and substance abuse. The patient has insomnia. The patient is not nervous/anxious.   All other systems reviewed and are negative.  Blood pressure 122/78, pulse 80, temperature 98.1 F (36.7 C), temperature source Tympanic, height 5\' 5"  (1.651 m), weight 153 lb 9.6 oz (69.673 kg), SpO2 93 %.Body mass index is 25.56 kg/(m^2).  General Appearance: Well Groomed  Eye Contact:  Good  Speech:  Normal Rate  Volume:  Normal  Mood:  up and down  Affect:  bright  Thought Process:  Linear and Logical  Orientation:  Full (Time, Place, and Person)  Thought Content:  Negative  Suicidal Thoughts:  No  Homicidal Thoughts:  No  Memory:  Immediate;   Good Recent;   Good Remote;   Good  Judgement:  Good  Insight:  Good  Psychomotor Activity:  Negative  Concentration:  Good  Recall:  Good  Fund of Knowledge: Good  Language: Good  Akathisia:  Negative   Handed:  Right  AIMS (if indicated):  N/A  Assets:  Communication Skills Desire for Improvement Social Support  ADL's:  Intact  Cognition: WNL  Sleep:  fair   Is the patient at risk to self?  No. Has the patient been a risk to self in the past 6 months?  No. Has the patient been a risk to self within the distant past?  No. Is the patient a risk to others?  No. Has the patient been a risk to others in the past 6 months?  No. Has the patient been a risk to others within the distant past?  No.  Current Medications: Current Outpatient Prescriptions  Medication Sig Dispense Refill  . aspirin 81 MG tablet Take 81 mg by mouth daily. 2 tablets daily    . atorvastatin (LIPITOR) 80 MG tablet Take 80 mg by mouth daily.    Marland Kitchen buPROPion (WELLBUTRIN XL) 150 MG 24 hr tablet Take 1 tablet (150 mg total) by mouth daily. 90 tablet 1  . Cholecalciferol (VITAMIN D-1000 MAX ST) 1000 UNITS tablet Take by mouth.    . DULoxetine (CYMBALTA) 60 MG capsule Take 1 capsule (60 mg total) by mouth daily. 90 capsule 0  . isosorbide mononitrate (IMDUR) 30 MG 24 hr tablet Take 30 mg by mouth daily.    Marland Kitchen LORazepam (ATIVAN) 0.5 MG tablet Take 1 tablet (0.5 mg total) by mouth at bedtime. 90 tablet 1  . metoprolol succinate (TOPROL-XL) 50 MG 24 hr tablet Take 50 mg by mouth 2 (two) times daily. Take with or immediately following a meal.    . nitroGLYCERIN (NITROSTAT) 0.4 MG SL tablet Place 0.4 mg under the tongue every 5 (five) minutes as needed for chest pain.    . Omega-3 Fatty Acids (FISH OIL CONCENTRATE) 1000 MG CAPS Take 4,000 mg by mouth.     No current facility-administered medications for this visit.    Medical Decision Making:  Established Problem, Stable/Improving (1), Review of Medication Regimen & Side Effects (2) and Review of New Medication or Change in Dosage (2)  Treatment Plan Summary:Medication management and Plan   Major depressive Disorder, recurrent severe-continue Cymbalta 60 mg  daily. Continue Wellbutrin at 150 mg once daily   Insomnia-continue the lorazepam 0.5 mg at bedtime as needed for insomnia. Discussed tapering off the lorazepam eventually and also to try not to take every day.  Patient will follow up in 3 months. Patient to call before with any questions   Shyniece Scripter 10/16/2015, 10:52 AM

## 2016-01-16 ENCOUNTER — Encounter: Payer: Self-pay | Admitting: Psychiatry

## 2016-01-16 ENCOUNTER — Ambulatory Visit (INDEPENDENT_AMBULATORY_CARE_PROVIDER_SITE_OTHER): Payer: 59 | Admitting: Psychiatry

## 2016-01-16 VITALS — BP 124/75 | HR 76 | Temp 98.4°F | Ht 65.0 in | Wt 155.8 lb

## 2016-01-16 DIAGNOSIS — F332 Major depressive disorder, recurrent severe without psychotic features: Secondary | ICD-10-CM | POA: Diagnosis not present

## 2016-01-16 MED ORDER — BUPROPION HCL ER (SR) 200 MG PO TB12: 200.0000 mg | ORAL_TABLET | Freq: Every morning | ORAL | 1 refills | Status: DC

## 2016-01-16 NOTE — Progress Notes (Signed)
Patient ID: Lori Wagner, female   DOB: 1950-11-17, 65 y.o.   MRN: [id number]   [facility] MD/PA/NP OP Progress Note  01/16/2016 10:49 AM Lori Wagner  MRN:  [id number]  Subjective:  Patient returns for follow-up of her major depressive disorder, recurrent, severe without psychotic features and panic disorder. She reports doing better in terms of her mood. Continues to perseverate on her relationship with her friend. States her friend is being vindictive and patient misses her friend. Patient also reports that her friend has not been talking to patient's daughter though she is having guarded parent. Patient states she does think about this on a daily basis and she is been friends with this person for 45 years. However she reports that she's been doing better than before. Otherwise she is sleeping well and eating well. She feels the Cymbalta has not been as helpful and states it is very expensive and would like to taper off it. Denies suicidal thoughts.  Chief Complaint: doing well Chief Complaint    Follow-up; Medication Refill    angry Visit Diagnosis:     ICD-9-CM ICD-10-CM   1. Major depressive disorder, recurrent, severe without psychotic features ([place]) 296.33 F33.2     Past Medical History:  Past Medical History:  Diagnosis Date  . Anxiety   . Arthritis    knees,hip , back and hands  . Chronic kidney disease    Hx of kidney stones, years ago  . Coronary artery disease   . Dysrhythmia    SVT- controlled with Beta blocker  . Fatigue   . HOH (hard of hearing)    tinnitis both ears  . Hypertension     can cause lightheadedness  . Motion sickness   . Neuromuscular disorder ([place])    carpal tunnel both hands  . PONV (postoperative nausea and vomiting)   . RA (rheumatoid arthritis) ([place])   . Sleep apnea    Hx of/ when overweight, no CPAP  . Thyroid disease    HX OF/ NO LONGER TAKING THYROID MEDS, THYROID TESTS CAME BACK NORMAL  . Vertigo    Hx of    Past Surgical  History:  Procedure Laterality Date  . ANKLE SURGERY Left   . APPENDECTOMY    . BLADDER REPAIR    . CARDIAC CATHETERIZATION    . CARDIAC SURGERY    . CORONARY ARTERY BYPASS GRAFT  2001   x4  . GASTRIC BYPASS     SLEEVE  . HAND SURGERY Right    2 TRIGGER FINGERS  . KNEE ARTHROSCOPY Right   . renal colic     Family History:  Family History  Problem Relation Age of Onset  . Hypertension Mother   . Coronary artery disease Mother   . Arthritis Mother   . Osteoporosis Mother   . Depression Mother   . Anxiety disorder Mother   . Coronary artery disease Father   . Stroke Father   . Coronary artery disease Brother   . Bipolar disorder Brother   . Drug abuse Brother   . Coronary artery disease Maternal Aunt   . Osteoporosis Maternal Aunt   . Coronary artery disease Cousin   . Osteoporosis Cousin   . Anxiety disorder Daughter   . Depression Daughter    Social History:  Social History   Social History  . Marital status: Married    Spouse name: N/A  . Number of children: N/A  . Years of education: N/A   Social History Main  Topics  . Smoking status: Never Smoker  . Smokeless tobacco: Never Used  . Alcohol use No  . Drug use: No  . Sexual activity: No   Other Topics Concern  . None   Social History Narrative  . None   Additional History:   Assessment:   Musculoskeletal: Strength & Muscle Tone: within normal limits Gait & Station: normal Patient leans: N/A  Psychiatric Specialty Exam: Depression         Associated symptoms include insomnia.  Associated symptoms include no suicidal ideas. Medication Refill     Review of Systems  Psychiatric/Behavioral: Positive for depression (at baseline and reporting more good days than bad). Negative for hallucinations, memory loss, substance abuse and suicidal ideas. The patient has insomnia. The patient is not nervous/anxious.   All other systems reviewed and are negative.   Blood pressure 124/75, pulse 76,  temperature 98.4 F (36.9 C), temperature source Oral, height 5\' 5"  (1.651 m), weight 155 lb 12.8 oz (70.7 kg).Body mass index is 25.93 kg/m.  General Appearance: Well Groomed  Eye Contact:  Good  Speech:  Normal Rate  Volume:  Normal  Mood:  better  Affect:  bright  Thought Process:  Linear and Logical  Orientation:  Full (Time, Place, and Person)  Thought Content:  Negative  Suicidal Thoughts:  No  Homicidal Thoughts:  No  Memory:  Immediate;   Good Recent;   Good Remote;   Good  Judgement:  Good  Insight:  Good  Psychomotor Activity:  Negative  Concentration:  Good  Recall:  Good  Fund of Knowledge: Good  Language: Good  Akathisia:  Negative  Handed:  Right  AIMS (if indicated):  N/A  Assets:  Communication Skills Desire for Improvement Social Support  ADL's:  Intact  Cognition: WNL  Sleep:  fair   Is the patient at risk to self?  No. Has the patient been a risk to self in the past 6 months?  No. Has the patient been a risk to self within the distant past?  No. Is the patient a risk to others?  No. Has the patient been a risk to others in the past 6 months?  No. Has the patient been a risk to others within the distant past?  No.  Current Medications: Current Outpatient Prescriptions  Medication Sig Dispense Refill  . aspirin 81 MG tablet Take 81 mg by mouth daily. 2 tablets daily /am    . atorvastatin (LIPITOR) 80 MG tablet Take 80 mg by mouth at bedtime.     . calcium gluconate 500 MG tablet Take 1 tablet by mouth daily.    . Cholecalciferol (VITAMIN D-1000 MAX ST) 1000 UNITS tablet Take by mouth.    . DULoxetine (CYMBALTA) 60 MG capsule Take 1 capsule (60 mg total) by mouth daily. (Patient taking differently: Take 60 mg by mouth daily. evening) 90 capsule 0  . isosorbide mononitrate (IMDUR) 30 MG 24 hr tablet Take 30 mg by mouth daily. am    . LORazepam (ATIVAN) 0.5 MG tablet Take 1 tablet (0.5 mg total) by mouth at bedtime. (Patient taking differently: Take 0.5  mg by mouth at bedtime. Also prn) 90 tablet 1  . metoprolol succinate (TOPROL-XL) 50 MG 24 hr tablet Take 25 mg by mouth daily. At night    . Multiple Vitamin (MULTI-VITAMIN DAILY PO) Take by mouth.    . nitroGLYCERIN (NITROSTAT) 0.4 MG SL tablet Place 0.4 mg under the tongue every 5 (five) minutes as needed for  chest pain.    . Omega-3 Fatty Acids (FISH OIL CONCENTRATE) 1000 MG CAPS Take 4,000 mg by mouth.    Marland Kitchen buPROPion (WELLBUTRIN SR) 200 MG 12 hr tablet Take 1 tablet (200 mg total) by mouth every morning. 90 tablet 1   No current facility-administered medications for this visit.     Medical Decision Making:  Established Problem, Stable/Improving (1), Review of Medication Regimen & Side Effects (2) and Review of New Medication or Change in Dosage (2)  Treatment Plan Summary:Medication management and Plan   Major depressive Disorder, recurrent in partial remission- Decrease Cymbalta to 60mg  every other day. Increase bupropion to 200mg  po qd.  Patient encouraged to start seeing a therapist.   Insomnia-continue the lorazepam 0.5 mg at bedtime as needed for insomnia. Discussed tapering off the lorazepam eventually and also to try not to take every day.  Patient will follow up in 1 months. Patient to call before with any questions   Ilee Randleman 01/16/2016, 10:49 AM

## 2016-01-17 ENCOUNTER — Ambulatory Visit
Admission: RE | Admit: 2016-01-17 | Discharge: 2016-01-17 | Disposition: A | Discharge status: Home / Self Care | Payer: Medicare Other | Source: Ambulatory Visit | Attending: Surgery | Admitting: Surgery

## 2016-01-17 ENCOUNTER — Encounter
Admission: RE | Disposition: A | Discharge status: Home / Self Care | Payer: Self-pay | Source: Ambulatory Visit | Attending: Surgery

## 2016-01-17 ENCOUNTER — Ambulatory Visit: Payer: Medicare Other | Admitting: Anesthesiology

## 2016-01-17 DIAGNOSIS — J301 Allergic rhinitis due to pollen: Secondary | ICD-10-CM | POA: Diagnosis not present

## 2016-01-17 DIAGNOSIS — Z882 Allergy status to sulfonamides status: Secondary | ICD-10-CM | POA: Insufficient documentation

## 2016-01-17 DIAGNOSIS — E079 Disorder of thyroid, unspecified: Secondary | ICD-10-CM | POA: Diagnosis not present

## 2016-01-17 DIAGNOSIS — Z823 Family history of stroke: Secondary | ICD-10-CM | POA: Diagnosis not present

## 2016-01-17 DIAGNOSIS — Z820 Family history of epilepsy and other diseases of the nervous system: Secondary | ICD-10-CM | POA: Insufficient documentation

## 2016-01-17 DIAGNOSIS — F329 Major depressive disorder, single episode, unspecified: Secondary | ICD-10-CM | POA: Diagnosis not present

## 2016-01-17 DIAGNOSIS — Z880 Allergy status to penicillin: Secondary | ICD-10-CM | POA: Diagnosis not present

## 2016-01-17 DIAGNOSIS — Z7982 Long term (current) use of aspirin: Secondary | ICD-10-CM | POA: Diagnosis not present

## 2016-01-17 DIAGNOSIS — F419 Anxiety disorder, unspecified: Secondary | ICD-10-CM | POA: Diagnosis not present

## 2016-01-17 DIAGNOSIS — Z91018 Allergy to other foods: Secondary | ICD-10-CM | POA: Insufficient documentation

## 2016-01-17 DIAGNOSIS — G43909 Migraine, unspecified, not intractable, without status migrainosus: Secondary | ICD-10-CM | POA: Insufficient documentation

## 2016-01-17 DIAGNOSIS — I10 Essential (primary) hypertension: Secondary | ICD-10-CM | POA: Insufficient documentation

## 2016-01-17 DIAGNOSIS — Z9884 Bariatric surgery status: Secondary | ICD-10-CM | POA: Diagnosis not present

## 2016-01-17 DIAGNOSIS — I471 Supraventricular tachycardia: Secondary | ICD-10-CM | POA: Insufficient documentation

## 2016-01-17 DIAGNOSIS — G4730 Sleep apnea, unspecified: Secondary | ICD-10-CM | POA: Insufficient documentation

## 2016-01-17 DIAGNOSIS — Z8261 Family history of arthritis: Secondary | ICD-10-CM | POA: Diagnosis not present

## 2016-01-17 DIAGNOSIS — Z818 Family history of other mental and behavioral disorders: Secondary | ICD-10-CM | POA: Diagnosis not present

## 2016-01-17 DIAGNOSIS — I2510 Atherosclerotic heart disease of native coronary artery without angina pectoris: Secondary | ICD-10-CM | POA: Insufficient documentation

## 2016-01-17 DIAGNOSIS — Z79899 Other long term (current) drug therapy: Secondary | ICD-10-CM | POA: Insufficient documentation

## 2016-01-17 DIAGNOSIS — Z951 Presence of aortocoronary bypass graft: Secondary | ICD-10-CM | POA: Diagnosis not present

## 2016-01-17 DIAGNOSIS — E785 Hyperlipidemia, unspecified: Secondary | ICD-10-CM | POA: Insufficient documentation

## 2016-01-17 DIAGNOSIS — Z8262 Family history of osteoporosis: Secondary | ICD-10-CM | POA: Diagnosis not present

## 2016-01-17 DIAGNOSIS — M65311 Trigger thumb, right thumb: Secondary | ICD-10-CM | POA: Insufficient documentation

## 2016-01-17 DIAGNOSIS — Z885 Allergy status to narcotic agent status: Secondary | ICD-10-CM | POA: Insufficient documentation

## 2016-01-17 DIAGNOSIS — K219 Gastro-esophageal reflux disease without esophagitis: Secondary | ICD-10-CM | POA: Insufficient documentation

## 2016-01-17 DIAGNOSIS — Z8249 Family history of ischemic heart disease and other diseases of the circulatory system: Secondary | ICD-10-CM | POA: Insufficient documentation

## 2016-01-17 DIAGNOSIS — Z888 Allergy status to other drugs, medicaments and biological substances status: Secondary | ICD-10-CM | POA: Insufficient documentation

## 2016-01-17 HISTORY — PX: TRIGGER FINGER RELEASE: SHX641

## 2016-01-17 HISTORY — DX: Sleep apnea, unspecified: G47.30

## 2016-01-17 HISTORY — DX: Unspecified hearing loss, unspecified ear: H91.90

## 2016-01-17 HISTORY — DX: Other specified postprocedural states: Z98.890

## 2016-01-17 HISTORY — DX: Motion sickness, initial encounter: T75.3XXA

## 2016-01-17 HISTORY — DX: Cardiac arrhythmia, unspecified: I49.9

## 2016-01-17 HISTORY — DX: Myoneural disorder, unspecified: G70.9

## 2016-01-17 HISTORY — DX: Chronic kidney disease, unspecified: N18.9

## 2016-01-17 HISTORY — DX: Dizziness and giddiness: R42

## 2016-01-17 HISTORY — DX: Other specified postprocedural states: R11.2

## 2016-01-17 SURGERY — RELEASE, A1 PULLEY, FOR TRIGGER FINGER
Anesthesia: Monitor Anesthesia Care | Laterality: Right | Wound class: Clean

## 2016-01-17 MED ORDER — SODIUM CHLORIDE 0.9 % IV SOLN
900.0000 mg | Freq: Once | INTRAVENOUS | Status: AC
  Administered 2016-01-17: 900 mg via INTRAVENOUS

## 2016-01-17 MED ORDER — MIDAZOLAM HCL 2 MG/2ML IJ SOLN
INTRAMUSCULAR | Status: DC | PRN
  Administered 2016-01-17: 2 mg via INTRAVENOUS

## 2016-01-17 MED ORDER — LACTATED RINGERS IV SOLN
INTRAVENOUS | Status: DC
  Administered 2016-01-17: 11:00:00 via INTRAVENOUS

## 2016-01-17 MED ORDER — HYDROCODONE-ACETAMINOPHEN 5-325 MG PO TABS: 1.0000 | ORAL_TABLET | Freq: Four times a day (QID) | ORAL | 0 refills | Status: DC | PRN

## 2016-01-17 MED ORDER — FENTANYL CITRATE (PF) 100 MCG/2ML IJ SOLN: 25.0000 ug | INTRAMUSCULAR | Status: DC | PRN

## 2016-01-17 MED ORDER — ONDANSETRON HCL 4 MG/2ML IJ SOLN
INTRAMUSCULAR | Status: DC | PRN
  Administered 2016-01-17: 4 mg via INTRAVENOUS

## 2016-01-17 MED ORDER — DEXAMETHASONE SODIUM PHOSPHATE 4 MG/ML IJ SOLN
INTRAMUSCULAR | Status: DC | PRN
  Administered 2016-01-17: 8 mg via INTRAVENOUS

## 2016-01-17 MED ORDER — ONDANSETRON HCL 4 MG/2ML IJ SOLN: 4.0000 mg | Freq: Once | INTRAMUSCULAR | Status: DC | PRN

## 2016-01-17 MED ORDER — PROPOFOL 500 MG/50ML IV EMUL
INTRAVENOUS | Status: DC | PRN
  Administered 2016-01-17: 13:00:00 via INTRAVENOUS
  Administered 2016-01-17: 100 ug/kg/min via INTRAVENOUS

## 2016-01-17 MED ORDER — BUPIVACAINE HCL (PF) 0.5 % IJ SOLN
INTRAMUSCULAR | Status: DC | PRN
  Administered 2016-01-17: 10 mL

## 2016-01-17 MED ORDER — LIDOCAINE HCL (CARDIAC) 20 MG/ML IV SOLN
INTRAVENOUS | Status: DC | PRN
  Administered 2016-01-17: 40 mg via INTRAVENOUS

## 2016-01-17 SURGICAL SUPPLY — 23 items
BANDAGE ELASTIC 2 LF NS (GAUZE/BANDAGES/DRESSINGS) ×3 IMPLANT
BNDG ESMARK 4X12 TAN STRL LF (GAUZE/BANDAGES/DRESSINGS) ×3 IMPLANT
CHLORAPREP W/TINT 26ML (MISCELLANEOUS) ×3 IMPLANT
CORD BIP STRL DISP 12FT (MISCELLANEOUS) ×3 IMPLANT
COVER LIGHT HANDLE UNIVERSAL (MISCELLANEOUS) ×6 IMPLANT
CUFF TOURNIQUET DUAL PORT 18X3 (MISCELLANEOUS) ×3 IMPLANT
DECANTER SPIKE VIAL GLASS SM (MISCELLANEOUS) ×3 IMPLANT
GAUZE PETRO XEROFOAM 1X8 (MISCELLANEOUS) ×3 IMPLANT
GAUZE SPONGE 4X4 12PLY STRL (GAUZE/BANDAGES/DRESSINGS) ×3 IMPLANT
GLOVE BIO SURGEON STRL SZ8 (GLOVE) ×6 IMPLANT
GLOVE INDICATOR 8.0 STRL GRN (GLOVE) ×3 IMPLANT
GOWN STRL REUS W/ TWL LRG LVL3 (GOWN DISPOSABLE) ×1 IMPLANT
GOWN STRL REUS W/ TWL XL LVL3 (GOWN DISPOSABLE) ×1 IMPLANT
GOWN STRL REUS W/TWL LRG LVL3 (GOWN DISPOSABLE) ×2
GOWN STRL REUS W/TWL XL LVL3 (GOWN DISPOSABLE) ×2
KIT ROOM TURNOVER OR (KITS) ×3 IMPLANT
NS IRRIG 500ML POUR BTL (IV SOLUTION) ×3 IMPLANT
PACK EXTREMITY ARMC (MISCELLANEOUS) ×3 IMPLANT
STOCKINETTE IMPERVIOUS 9X36 MD (GAUZE/BANDAGES/DRESSINGS) ×3 IMPLANT
STRAP BODY AND KNEE 60X3 (MISCELLANEOUS) ×3 IMPLANT
SUT PROLENE 4 0 PS 2 18 (SUTURE) ×3 IMPLANT
SUT VIC AB 3-0 SH 27 (SUTURE)
SUT VIC AB 3-0 SH 27X BRD (SUTURE) IMPLANT

## 2016-01-17 NOTE — Transfer of Care (Signed)
Immediate Anesthesia Transfer of Care Note  Patient: Lori Wagner  Procedure(s) Performed: Procedure(s): RELEASE TRIGGER FINGER/A-1 PULLEY THUMB (Right)  Patient Location: PACU  Anesthesia Type: Bier Block, MAC  Level of Consciousness: awake, alert  and patient cooperative  Airway and Oxygen Therapy: Patient Spontanous Breathing and Patient connected to supplemental oxygen  Post-op Assessment: Post-op Vital signs reviewed, Patient's Cardiovascular Status Stable, Respiratory Function Stable, Patent Airway and No signs of Nausea or vomiting  Post-op Vital Signs: Reviewed and stable  Complications: No apparent anesthesia complications

## 2016-01-17 NOTE — H&P (Signed)
Paper H&P to be scanned into permanent record. H&P reviewed. No changes. 

## 2016-01-17 NOTE — Anesthesia Preprocedure Evaluation (Signed)
Anesthesia Evaluation  Patient identified by MRN, date of birth, ID band Patient awake    Reviewed: Allergy & Precautions, H&P , NPO status , Patient's Chart, lab work & pertinent test results  History of Anesthesia Complications (+) PONV  Airway Mallampati: II  TM Distance: >3 FB Neck ROM: full    Dental no notable dental hx.    Pulmonary    Pulmonary exam normal        Cardiovascular hypertension, + CAD  Normal cardiovascular exam+ dysrhythmias Supra Ventricular Tachycardia      Neuro/Psych    GI/Hepatic   Endo/Other    Renal/GU      Musculoskeletal  (+) Arthritis ,   Abdominal   Peds  Hematology   Anesthesia Other Findings   Reproductive/Obstetrics                             Anesthesia Physical Anesthesia Plan  ASA: III  Anesthesia Plan: Bier Block and MAC   Post-op Pain Management:    Induction:   Airway Management Planned: Simple Face Mask  Additional Equipment:   Intra-op Plan:   Post-operative Plan:   Informed Consent: I have reviewed the patients History and Physical, chart, labs and discussed the procedure including the risks, benefits and alternatives for the proposed anesthesia with the patient or authorized representative who has indicated his/her understanding and acceptance.     Plan Discussed with:   Anesthesia Plan Comments:         Anesthesia Quick Evaluation

## 2016-01-17 NOTE — Anesthesia Procedure Notes (Signed)
Procedure Name: MAC Performed by: Agnes Probert Pre-anesthesia Checklist: Patient identified, Emergency Drugs available, Suction available, Timeout performed and Patient being monitored Patient Re-evaluated:Patient Re-evaluated prior to inductionOxygen Delivery Method: Simple face mask Placement Confirmation: positive ETCO2       

## 2016-01-17 NOTE — Discharge Instructions (Signed)
General Anesthesia, Adult, Care After Refer to this sheet in the next few weeks. These instructions provide you with information on caring for yourself after your procedure. Your health care provider may also give you more specific instructions. Your treatment has been planned according to current medical practices, but problems sometimes occur. Call your health care provider if you have any problems or questions after your procedure. WHAT TO EXPECT AFTER THE PROCEDURE After the procedure, it is typical to experience:  Sleepiness.  Nausea and vomiting. HOME CARE INSTRUCTIONS  For the first 24 hours after general anesthesia:  Have a responsible person with you.  Do not drive a car. If you are alone, do not take public transportation.  Do not drink alcohol.  Do not take medicine that has not been prescribed by your health care provider.  Do not sign important papers or make important decisions.  You may resume a normal diet and activities as directed by your health care provider.  Change bandages (dressings) as directed.  If you have questions or problems that seem related to general anesthesia, call the hospital and ask for the anesthetist or anesthesiologist on call. SEEK MEDICAL CARE IF:  You have nausea and vomiting that continue the day after anesthesia.  You develop a rash. SEEK IMMEDIATE MEDICAL CARE IF:   You have difficulty breathing.  You have chest pain.  You have any allergic problems.   This information is not intended to replace advice given to you by your health care provider. Make sure you discuss any questions you have with your health care provider.   Document Released: 08/19/2000 Document Revised: 06/03/2014 Document Reviewed: 09/11/2011 Elsevier Interactive Patient Education 2016 [employer].  Keep dressing dry and intact. Keep hand elevated above heart level. May shower after dressing removed on postop day 4 (Sunday). Cover sutures with Band-Aids  after drying off. Apply ice to affected area frequently. Take ibuprofen 600 mg TID with meals for 7-10 days, then as necessary. Take pain medication as prescribed when needed.  Return for follow-up in 10-14 days or as scheduled.

## 2016-01-17 NOTE — Op Note (Signed)
01/17/2016  1:38 PM  Patient:   Lori Wagner  Pre-Op Diagnosis:   Right trigger thumb.  Post-Op Diagnosis:   Same  Procedure:   Release right trigger thumb.  Surgeon:   Pascal Lux, MD  Assistant:   None  Anesthesia:   Bier block  Findings:   As above.  Complications:   None  EBL:   0 cc  Fluids:   500 cc crystalloid  TT:   27 minutes at 250 mmHg  Drains:   None  Closure:   4-0 Prolene  Implants:   None  Brief Clinical Note:   The patient is a 65 year old female with a several month history of progressively worsening painful catching of her right thumb. These symptoms have progressed despite medications, activity modification, etc. The patient's history and examination were consistent with a right trigger thumb. The patient presents at this time for a right trigger thumb release.  Procedure:   The patient was brought into the operating room and lain in the supine position. After adequate IV sedation was achieved, a timeout was performed to verify the appropriate surgical site. A Bier block was placed by the anesthesiologist and the tourniquet inflated to 250 mmHg. The right hand and upper extremity were prepped with ChloraPrep solution before being draped sterilely. Preoperative antibiotics were administered. An approximately 1.5 cm incision was made over the volar aspect of the right thumb at the level of the metacarpal head centered over the flexor sheath. The incision was carried down through the subcutaneous tissues with care taken to identify and protect the digital neurovascular structures. The flexor sheath was entered just proximal to the A1 pulley. The sheath was released proximally for several centimeters under direct visualization. Distally, a clamp was placed beneath the A1 pulley and used to release any adhesions. The clamp was repositioned so that one jaw was superficial to and the other jaw deep to the A1 pulley. The A1 pulley was incised on either side  of the clamp to remove a 2 mm strip of tissue. Metzenbaum scissors was used to ensure complete release of the A1 pulley more distally. The underlying tendons were carefully inspected and found to be intact.   The wound was copiously irrigated with sterile saline solution before the wound was closed using 4-0 Prolene interrupted sutures. A total of 10 cc of 0.5% plain Sensorcaine was injected in and around the incision to help with postoperative analgesia before a sterile bulky dressing was applied to the hand. The patient was then awakened and returned to the recovery room in satisfactory condition after tolerating the procedure well.

## 2016-01-17 NOTE — Anesthesia Postprocedure Evaluation (Signed)
Anesthesia Post Note  Patient: Lori Wagner  Procedure(s) Performed: Procedure(s) (LRB): RELEASE TRIGGER FINGER/A-1 PULLEY THUMB (Right)  Patient location during evaluation: PACU Anesthesia Type: MAC and Bier Block Level of consciousness: awake and alert and oriented Pain management: satisfactory to patient Vital Signs Assessment: post-procedure vital signs reviewed and stable Respiratory status: spontaneous breathing, nonlabored ventilation and respiratory function stable Cardiovascular status: blood pressure returned to baseline and stable Postop Assessment: Adequate PO intake and No signs of nausea or vomiting Anesthetic complications: no    Raliegh Ip

## 2016-01-17 NOTE — Anesthesia Procedure Notes (Signed)
Anesthesia Regional Block:  Bier block (IV Regional)  Pre-Anesthetic Checklist: ,, timeout performed, Correct Patient, Correct Site, Correct Laterality, Correct Procedure, Correct Position, site marked, Risks and benefits discussed, Surgical consent,  Pre-op evaluation,  At surgeon's request  Laterality: Right     Bier block ([facility]) Narrative:  Start time: 01/17/2016 1:05 PM End time: 01/17/2016 1:19 PM Injection made incrementally with aspirations every 5 mL.

## 2016-01-18 ENCOUNTER — Encounter: Payer: Self-pay | Admitting: Surgery

## 2016-01-18 DIAGNOSIS — M65311 Trigger thumb, right thumb: Secondary | ICD-10-CM | POA: Insufficient documentation

## 2016-02-15 ENCOUNTER — Ambulatory Visit (INDEPENDENT_AMBULATORY_CARE_PROVIDER_SITE_OTHER): Payer: 59 | Admitting: Psychiatry

## 2016-02-15 DIAGNOSIS — F332 Major depressive disorder, recurrent severe without psychotic features: Secondary | ICD-10-CM | POA: Diagnosis not present

## 2016-02-15 DIAGNOSIS — G4700 Insomnia, unspecified: Secondary | ICD-10-CM | POA: Diagnosis not present

## 2016-02-15 MED ORDER — ZOLPIDEM TARTRATE ER 6.25 MG PO TBCR: 6.2500 mg | EXTENDED_RELEASE_TABLET | Freq: Every evening | ORAL | 0 refills | Status: DC | PRN

## 2016-02-15 NOTE — Progress Notes (Signed)
Patient ID: Lori Wagner, female   DOB: Mar 20, 1951, 65 y.o.   MRN: [id number]   [facility] MD/PA/NP OP Progress Note  02/15/2016 10:39 AM Lori Wagner  MRN:  [id number]  Subjective:  Patient returns for follow-up of her major depressive disorder, recurrent, severe without psychotic features and panic disorder. Patient reports doing well in terms of her mood. She has tolerated the increase in Wellbutrin well. States that she is taking the Cymbalta 60 mg every 3-4 days. Feels she is ready to stop taking that. Continues to have some trouble with back pain and states Cymbalta was helpful in the past but not anymore. She continues to have trouble sleeping. Taking the lorazepam at night but it's not helping. She is okay with changing her treatment for sleep. Denies suicidal thoughts.  Chief Complaint: doing well  Visit Diagnosis:     ICD-9-CM ICD-10-CM   1. Major depressive disorder, recurrent, severe without psychotic features ([place]) 296.33 F33.2   2. Insomnia 780.52 G47.00     Past Medical History:  Past Medical History:  Diagnosis Date  . Anxiety   . Arthritis    knees,hip , back and hands  . Chronic kidney disease    Hx of kidney stones, years ago  . Coronary artery disease   . Dysrhythmia    SVT- controlled with Beta blocker  . Fatigue   . HOH (hard of hearing)    tinnitis both ears  . Hypertension     can cause lightheadedness  . Motion sickness   . Neuromuscular disorder ([place])    carpal tunnel both hands  . PONV (postoperative nausea and vomiting)   . RA (rheumatoid arthritis) ([place])   . Sleep apnea    Hx of/ when overweight, no CPAP  . Thyroid disease    HX OF/ NO LONGER TAKING THYROID MEDS, THYROID TESTS CAME BACK NORMAL  . Vertigo    Hx of    Past Surgical History:  Procedure Laterality Date  . ANKLE SURGERY Left   . APPENDECTOMY    . BLADDER REPAIR    . CARDIAC CATHETERIZATION    . CARDIAC SURGERY    . CORONARY ARTERY BYPASS GRAFT  2001   x4  . GASTRIC  BYPASS     SLEEVE  . HAND SURGERY Right    2 TRIGGER FINGERS  . KNEE ARTHROSCOPY Right   . renal colic    . TRIGGER FINGER RELEASE Right 01/17/2016   Procedure: RELEASE TRIGGER FINGER/A-1 PULLEY THUMB;  Surgeon: Corky Mull, MD;  Location: [facility];  Service: Orthopedics;  Laterality: Right;   Family History:  Family History  Problem Relation Age of Onset  . Hypertension Mother   . Coronary artery disease Mother   . Arthritis Mother   . Osteoporosis Mother   . Depression Mother   . Anxiety disorder Mother   . Coronary artery disease Father   . Stroke Father   . Coronary artery disease Brother   . Bipolar disorder Brother   . Drug abuse Brother   . Coronary artery disease Maternal Aunt   . Osteoporosis Maternal Aunt   . Coronary artery disease Cousin   . Osteoporosis Cousin   . Anxiety disorder Daughter   . Depression Daughter    Social History:  Social History   Social History  . Marital status: Married    Spouse name: N/A  . Number of children: N/A  . Years of education: N/A   Social History Main Topics  .  Smoking status: Never Smoker  . Smokeless tobacco: Never Used  . Alcohol use No  . Drug use: No  . Sexual activity: No   Other Topics Concern  . Not on file   Social History Narrative  . No narrative on file   Additional History:   Assessment:   Musculoskeletal: Strength & Muscle Tone: within normal limits Gait & Station: normal Patient leans: N/A  Psychiatric Specialty Exam: Medication Refill   Depression         Associated symptoms include insomnia.  Associated symptoms include no suicidal ideas.   Review of Systems  Psychiatric/Behavioral: Positive for depression (at baseline and reporting more good days than bad). Negative for hallucinations, memory loss, substance abuse and suicidal ideas. The patient has insomnia. The patient is not nervous/anxious.   All other systems reviewed and are negative.   There were no vitals taken  for this visit.There is no height or weight on file to calculate BMI.  General Appearance: Well Groomed  Eye Contact:  Good  Speech:  Normal Rate  Volume:  Normal  Mood:  better  Affect:  bright  Thought Process:  Linear and Logical  Orientation:  Full (Time, Place, and Person)  Thought Content:  Negative  Suicidal Thoughts:  No  Homicidal Thoughts:  No  Memory:  Immediate;   Good Recent;   Good Remote;   Good  Judgement:  Good  Insight:  Good  Psychomotor Activity:  Negative  Concentration:  Good  Recall:  Good  Fund of Knowledge: Good  Language: Good  Akathisia:  Negative  Handed:  Right  AIMS (if indicated):  N/A  Assets:  Communication Skills Desire for Improvement Social Support  ADL's:  Intact  Cognition: WNL  Sleep:  fair   Is the patient at risk to self?  No. Has the patient been a risk to self in the past 6 months?  No. Has the patient been a risk to self within the distant past?  No. Is the patient a risk to others?  No. Has the patient been a risk to others in the past 6 months?  No. Has the patient been a risk to others within the distant past?  No.  Current Medications: Current Outpatient Prescriptions  Medication Sig Dispense Refill  . aspirin 81 MG tablet Take 81 mg by mouth daily. 2 tablets daily /am    . atorvastatin (LIPITOR) 80 MG tablet Take 80 mg by mouth at bedtime.     Marland Kitchen buPROPion (WELLBUTRIN SR) 200 MG 12 hr tablet Take 1 tablet (200 mg total) by mouth every morning. 90 tablet 1  . calcium gluconate 500 MG tablet Take 1 tablet by mouth daily.    . Cholecalciferol (VITAMIN D-1000 MAX ST) 1000 UNITS tablet Take by mouth.    Marland Kitchen HYDROcodone-acetaminophen (NORCO) 5-325 MG tablet Take 1-2 tablets by mouth every 6 (six) hours as needed for moderate pain. MAXIMUM TOTAL ACETAMINOPHEN DOSE IS 4000 MG PER DAY 30 tablet 0  . isosorbide mononitrate (IMDUR) 30 MG 24 hr tablet Take 30 mg by mouth daily. am    . metoprolol succinate (TOPROL-XL) 50 MG 24 hr  tablet Take 25 mg by mouth daily. At night    . Multiple Vitamin (MULTI-VITAMIN DAILY PO) Take by mouth.    . nitroGLYCERIN (NITROSTAT) 0.4 MG SL tablet Place 0.4 mg under the tongue every 5 (five) minutes as needed for chest pain.    . Omega-3 Fatty Acids (FISH OIL CONCENTRATE) 1000 MG CAPS  Take 4,000 mg by mouth.     No current facility-administered medications for this visit.     Medical Decision Making:  Established Problem, Stable/Improving (1), Review of Medication Regimen & Side Effects (2) and Review of New Medication or Change in Dosage (2)  Treatment Plan Summary:Medication management and Plan   Major depressive Disorder, recurrent in partial remission  Continue bupropion to 200mg  po qd.  Can discontinue the Cymbalta  Patient encouraged to start seeing a therapist.   Insomnia- Start Ambien at 5mg  po qhs.  Patient will follow up in 1 months. Patient to call before with any questions   Kapri Nero 02/15/2016, 10:39 AM

## 2016-03-14 ENCOUNTER — Ambulatory Visit: Payer: Medicare Other | Admitting: Psychiatry

## 2016-03-27 ENCOUNTER — Encounter: Payer: Self-pay | Admitting: Psychiatry

## 2016-03-27 ENCOUNTER — Ambulatory Visit (INDEPENDENT_AMBULATORY_CARE_PROVIDER_SITE_OTHER): Payer: Medicare Other | Admitting: Psychiatry

## 2016-03-27 VITALS — BP 121/78 | HR 80 | Temp 98.3°F | Wt 158.6 lb

## 2016-03-27 DIAGNOSIS — F410 Panic disorder [episodic paroxysmal anxiety] without agoraphobia: Secondary | ICD-10-CM

## 2016-03-27 DIAGNOSIS — F332 Major depressive disorder, recurrent severe without psychotic features: Secondary | ICD-10-CM | POA: Diagnosis not present

## 2016-03-27 NOTE — Progress Notes (Signed)
Patient ID: Lori Wagner, female   DOB: 1951/05/24, 65 y.o.   MRN: [id number]   [facility] MD/PA/NP OP Progress Note  03/27/2016 1:32 PM Lori Wagner  MRN:  [id number]  Subjective:  Patient returns for follow-up of her major depressive disorder, recurrent, severe without psychotic features and panic disorder. Patient reports she became very anxious going on vacation and the she restarted her Cymbalta at 60 mg. States that she is unable to say if that dosage was helpful for her. She also has had trouble sleeping and took Benadryl on a few occasions. States that one day after she started the Cymbalta she had a day. She felt very anxious and depressed and felt like she did not want to live anymore. Currently denies any suicidal thoughts. States that she is not a happy person in general and feels like she has been depressed all her life. She had a good time vacationing in [place]. States that just travel makes her anxious.  Chief Complaint: doing okay Chief Complaint    Follow-up; Medication Refill     Visit Diagnosis:     ICD-9-CM ICD-10-CM   1. Major depressive disorder, recurrent, severe without psychotic features ([place]) 296.33 F33.2   2. Panic disorder 300.01 F41.0     Past Medical History:  Past Medical History:  Diagnosis Date  . Anxiety   . Arthritis    knees,hip , back and hands  . Chronic kidney disease    Hx of kidney stones, years ago  . Coronary artery disease   . Dysrhythmia    SVT- controlled with Beta blocker  . Fatigue   . HOH (hard of hearing)    tinnitis both ears  . Hypertension     can cause lightheadedness  . Motion sickness   . Neuromuscular disorder ([place])    carpal tunnel both hands  . PONV (postoperative nausea and vomiting)   . RA (rheumatoid arthritis) ([place])   . Sleep apnea    Hx of/ when overweight, no CPAP  . Thyroid disease    HX OF/ NO LONGER TAKING THYROID MEDS, THYROID TESTS CAME BACK NORMAL  . Vertigo    Hx of    Past Surgical History:   Procedure Laterality Date  . ANKLE SURGERY Left   . APPENDECTOMY    . BLADDER REPAIR    . CARDIAC CATHETERIZATION    . CARDIAC SURGERY    . CORONARY ARTERY BYPASS GRAFT  2001   x4  . GASTRIC BYPASS     SLEEVE  . HAND SURGERY Right    2 TRIGGER FINGERS  . KNEE ARTHROSCOPY Right   . renal colic    . TRIGGER FINGER RELEASE Right 01/17/2016   Procedure: RELEASE TRIGGER FINGER/A-1 PULLEY THUMB;  Surgeon: Corky Mull, MD;  Location: [facility];  Service: Orthopedics;  Laterality: Right;   Family History:  Family History  Problem Relation Age of Onset  . Hypertension Mother   . Coronary artery disease Mother   . Arthritis Mother   . Osteoporosis Mother   . Depression Mother   . Anxiety disorder Mother   . Coronary artery disease Father   . Stroke Father   . Coronary artery disease Brother   . Bipolar disorder Brother   . Drug abuse Brother   . Coronary artery disease Maternal Aunt   . Osteoporosis Maternal Aunt   . Coronary artery disease Cousin   . Osteoporosis Cousin   . Anxiety disorder Daughter   . Depression Daughter  Social History:  Social History   Social History  . Marital status: Married    Spouse name: N/A  . Number of children: N/A  . Years of education: N/A   Social History Main Topics  . Smoking status: Never Smoker  . Smokeless tobacco: Never Used  . Alcohol use No  . Drug use: No  . Sexual activity: No   Other Topics Concern  . None   Social History Narrative  . None   Additional History:   Assessment:   Musculoskeletal: Strength & Muscle Tone: within normal limits Gait & Station: normal Patient leans: N/A  Psychiatric Specialty Exam: Medication Refill   Depression         Associated symptoms include insomnia.  Associated symptoms include no suicidal ideas.   Review of Systems  Psychiatric/Behavioral: Positive for depression (at baseline and reporting more good days than bad). Negative for hallucinations, memory loss,  substance abuse and suicidal ideas. The patient has insomnia. The patient is not nervous/anxious.   All other systems reviewed and are negative.   Blood pressure 121/78, pulse 80, temperature 98.3 F (36.8 C), temperature source Oral, weight 158 lb 9.6 oz (71.9 kg).Body mass index is 26.39 kg/m.  General Appearance: Well Groomed  Eye Contact:  Good  Speech:  Normal Rate  Volume:  Normal  Mood:  better  Affect:  bright  Thought Process:  Linear and Logical  Orientation:  Full (Time, Place, and Person)  Thought Content:  Negative  Suicidal Thoughts:  No  Homicidal Thoughts:  No  Memory:  Immediate;   Good Recent;   Good Remote;   Good  Judgement:  Good  Insight:  Good  Psychomotor Activity:  Negative  Concentration:  Good  Recall:  Good  Fund of Knowledge: Good  Language: Good  Akathisia:  Negative  Handed:  Right  AIMS (if indicated):  N/A  Assets:  Communication Skills Desire for Improvement Social Support  ADL's:  Intact  Cognition: WNL  Sleep:  fair   Is the patient at risk to self?  No. Has the patient been a risk to self in the past 6 months?  No. Has the patient been a risk to self within the distant past?  No. Is the patient a risk to others?  No. Has the patient been a risk to others in the past 6 months?  No. Has the patient been a risk to others within the distant past?  No.  Current Medications: Current Outpatient Prescriptions  Medication Sig Dispense Refill  . aspirin 81 MG tablet Take 81 mg by mouth daily. 2 tablets daily /am    . atorvastatin (LIPITOR) 80 MG tablet Take 80 mg by mouth at bedtime.     Marland Kitchen buPROPion (WELLBUTRIN SR) 200 MG 12 hr tablet Take 1 tablet (200 mg total) by mouth every morning. 90 tablet 1  . calcium gluconate 500 MG tablet Take 1 tablet by mouth daily.    . Cholecalciferol (VITAMIN D-1000 MAX ST) 1000 UNITS tablet Take by mouth.    Marland Kitchen HYDROcodone-acetaminophen (NORCO) 5-325 MG tablet Take 1-2 tablets by mouth every 6 (six) hours  as needed for moderate pain. MAXIMUM TOTAL ACETAMINOPHEN DOSE IS 4000 MG PER DAY 30 tablet 0  . isosorbide mononitrate (IMDUR) 30 MG 24 hr tablet Take 30 mg by mouth daily. am    . metoprolol succinate (TOPROL-XL) 50 MG 24 hr tablet Take 25 mg by mouth daily. At night    . Multiple Vitamin (MULTI-VITAMIN DAILY  PO) Take by mouth.    . nitroGLYCERIN (NITROSTAT) 0.4 MG SL tablet Place 0.4 mg under the tongue every 5 (five) minutes as needed for chest pain.    . Omega-3 Fatty Acids (FISH OIL CONCENTRATE) 1000 MG CAPS Take 4,000 mg by mouth.    . zolpidem (AMBIEN CR) 6.25 MG CR tablet Take 1 tablet (6.25 mg total) by mouth at bedtime as needed for sleep. 30 tablet 0   No current facility-administered medications for this visit.     Medical Decision Making:  Established Problem, Stable/Improving (1), Review of Medication Regimen & Side Effects (2) and Review of New Medication or Change in Dosage (2)  Treatment Plan Summary:Medication management and Plan   Major depressive Disorder, recurrent in partial remission  Continue bupropion to 200mg  po qd.  Continue cymbalta at 60mg  once daily. Patient has obtained this medication through her friend elsewhere and does not need a refill on it. Patient educated about tweaking medications on her own. She was advised that she needs to start something slowly at a low dose and increasing and not restart her medications at such high dosages.  Patient encouraged to start seeing a therapist.   Insomnia- Discontinue the ambien.  Patient will follow up in 1 months. Patient to call before with any questions   Lori Wagner 03/27/2016, 1:32 PM

## 2016-04-15 ENCOUNTER — Telehealth: Payer: Self-pay

## 2016-04-15 NOTE — Telephone Encounter (Signed)
Medication request - Telephone call with patient to follow up on message she left requesting Dr. Einar Grad consider placing her on a low dosage of Elavil to assist with sleep.  States she spoke with her cardiologist who is okay with low dosage.  Informed patient Dr. Einar Grad was out of the office this date but would send request to her to review upon her return and patient agreed with plan.

## 2016-04-16 NOTE — Telephone Encounter (Signed)
I would rather follow up with the patient and discuss this with her in person and review the side effects and any probable interactions with other medications prior to starting Elavil.

## 2016-04-16 NOTE — Telephone Encounter (Signed)
Medication management - Telephone call with patient to inform Dr. Einar Grad would want to meet with her to discuss possible medication side effects and interactions prior to starting her on Elavil.  Arranged for patient to come in on 05/01/16 for follow up.

## 2016-04-19 ENCOUNTER — Other Ambulatory Visit: Payer: Self-pay | Admitting: Psychiatry

## 2016-05-01 ENCOUNTER — Ambulatory Visit: Payer: Self-pay | Admitting: Psychiatry

## 2016-05-01 ENCOUNTER — Encounter: Payer: Self-pay | Admitting: Psychiatry

## 2016-05-01 ENCOUNTER — Ambulatory Visit (INDEPENDENT_AMBULATORY_CARE_PROVIDER_SITE_OTHER): Payer: 59 | Admitting: Psychiatry

## 2016-05-01 VITALS — BP 131/79 | HR 73 | Temp 97.4°F | Wt 158.0 lb

## 2016-05-01 DIAGNOSIS — F332 Major depressive disorder, recurrent severe without psychotic features: Secondary | ICD-10-CM | POA: Diagnosis not present

## 2016-05-01 MED ORDER — LORAZEPAM 0.5 MG PO TABS: 0.5000 mg | ORAL_TABLET | Freq: Every day | ORAL | 1 refills | Status: DC

## 2016-05-01 NOTE — Progress Notes (Signed)
Patient ID: Lori Wagner, female   DOB: [birth year], 65 y.o.   MRN: [id number]   [facility] MD/PA/NP OP Progress Note  05/01/2016 3:00 PM Lori Wagner  MRN:  [id number]  Subjective:  Patient returns for follow-up of her major depressive disorder, recurrent in remission  and panic disorder. Patient reports that she's been doing okay mood wise but continues to have some anxiety. On she reports that she is very upset with the thoughts going on currently in the political arena. States that at night she cannot shut off her thoughts and is finding it difficult to sleep. She would like to start taking the Ativan at 0.5 mg to help her sleep. States that she is tolerating the Wellbutrin and Cymbalta okay. We discussed the interactions with increased risk of seizures but [name] feels this regimen has been working for her. Denies any suicidal thoughts. She is been active in volunteering in her community.   Chief Complaint: doing okay  Visit Diagnosis:     ICD-9-CM ICD-10-CM   1. Major depressive disorder, recurrent, severe without psychotic features ([place]) 296.33 F33.2     Past Medical History:  Past Medical History:  Diagnosis Date  . Anxiety   . Arthritis    knees,hip , back and hands  . Chronic kidney disease    Hx of kidney stones, years ago  . Coronary artery disease   . Dysrhythmia    SVT- controlled with Beta blocker  . Fatigue   . HOH (hard of hearing)    tinnitis both ears  . Hypertension     can cause lightheadedness  . Motion sickness   . Neuromuscular disorder ([place])    carpal tunnel both hands  . PONV (postoperative nausea and vomiting)   . RA (rheumatoid arthritis) ([place])   . Sleep apnea    Hx of/ when overweight, no CPAP  . Thyroid disease    HX OF/ NO LONGER TAKING THYROID MEDS, THYROID TESTS CAME BACK NORMAL  . Vertigo    Hx of    Past Surgical History:  Procedure Laterality Date  . ANKLE SURGERY Left   . APPENDECTOMY    . BLADDER REPAIR    . CARDIAC  CATHETERIZATION    . CARDIAC SURGERY    . CORONARY ARTERY BYPASS GRAFT  2001   x4  . GASTRIC BYPASS     SLEEVE  . HAND SURGERY Right    2 TRIGGER FINGERS  . KNEE ARTHROSCOPY Right   . renal colic    . TRIGGER FINGER RELEASE Right 01/17/2016   Procedure: RELEASE TRIGGER FINGER/A-1 PULLEY THUMB;  Surgeon: Corky Mull, MD;  Location: [facility];  Service: Orthopedics;  Laterality: Right;   Family History:  Family History  Problem Relation Age of Onset  . Hypertension Mother   . Coronary artery disease Mother   . Arthritis Mother   . Osteoporosis Mother   . Depression Mother   . Anxiety disorder Mother   . Coronary artery disease Father   . Stroke Father   . Coronary artery disease Brother   . Bipolar disorder Brother   . Drug abuse Brother   . Coronary artery disease Maternal Aunt   . Osteoporosis Maternal Aunt   . Coronary artery disease Cousin   . Osteoporosis Cousin   . Anxiety disorder Daughter   . Depression Daughter    Social History:  Social History   Social History  . Marital status: Married    Spouse name: N/A  .  Number of children: N/A  . Years of education: N/A   Social History Main Topics  . Smoking status: Never Smoker  . Smokeless tobacco: Never Used  . Alcohol use No  . Drug use: No  . Sexual activity: No   Other Topics Concern  . Not on file   Social History Narrative  . No narrative on file   Additional History:   Assessment:   Musculoskeletal: Strength & Muscle Tone: within normal limits Gait & Station: normal Patient leans: N/A  Psychiatric Specialty Exam: Medication Refill   Depression         Associated symptoms include insomnia.  Associated symptoms include no suicidal ideas.   Review of Systems  Psychiatric/Behavioral: Negative for depression (at baseline and reporting more good days than bad), hallucinations, memory loss, substance abuse and suicidal ideas. The patient has insomnia. The patient is not  nervous/anxious.   All other systems reviewed and are negative.   There were no vitals taken for this visit.There is no height or weight on file to calculate BMI.  General Appearance: Well Groomed  Eye Contact:  Good  Speech:  Normal Rate  Volume:  Normal  Mood:  better  Affect:  bright  Thought Process:  Linear and Logical  Orientation:  Full (Time, Place, and Person)  Thought Content:  Negative  Suicidal Thoughts:  No  Homicidal Thoughts:  No  Memory:  Immediate;   Good Recent;   Good Remote;   Good  Judgement:  Good  Insight:  Good  Psychomotor Activity:  Negative  Concentration:  Good  Recall:  Good  Fund of Knowledge: Good  Language: Good  Akathisia:  Negative  Handed:  Right  AIMS (if indicated):  N/A  Assets:  Communication Skills Desire for Improvement Social Support  ADL's:  Intact  Cognition: WNL  Sleep:  poor   Is the patient at risk to self?  No. Has the patient been a risk to self in the past 6 months?  No. Has the patient been a risk to self within the distant past?  No. Is the patient a risk to others?  No. Has the patient been a risk to others in the past 6 months?  No. Has the patient been a risk to others within the distant past?  No.  Current Medications: Current Outpatient Prescriptions  Medication Sig Dispense Refill  . aspirin 81 MG tablet Take 81 mg by mouth daily. 2 tablets daily /am    . atorvastatin (LIPITOR) 80 MG tablet Take 80 mg by mouth at bedtime.     Marland Kitchen buPROPion (WELLBUTRIN SR) 200 MG 12 hr tablet Take 1 tablet (200 mg total) by mouth every morning. 90 tablet 1  . calcium gluconate 500 MG tablet Take 1 tablet by mouth daily.    . Cholecalciferol (VITAMIN D-1000 MAX ST) 1000 UNITS tablet Take by mouth.    Marland Kitchen HYDROcodone-acetaminophen (NORCO) 5-325 MG tablet Take 1-2 tablets by mouth every 6 (six) hours as needed for moderate pain. MAXIMUM TOTAL ACETAMINOPHEN DOSE IS 4000 MG PER DAY 30 tablet 0  . isosorbide mononitrate (IMDUR) 30 MG 24  hr tablet Take 30 mg by mouth daily. am    . LORazepam (ATIVAN) 0.5 MG tablet Take 1 tablet (0.5 mg total) by mouth at bedtime. 30 tablet 1  . metoprolol succinate (TOPROL-XL) 50 MG 24 hr tablet Take 25 mg by mouth daily. At night    . Multiple Vitamin (MULTI-VITAMIN DAILY PO) Take by mouth.    Marland Kitchen  nitroGLYCERIN (NITROSTAT) 0.4 MG SL tablet Place 0.4 mg under the tongue every 5 (five) minutes as needed for chest pain.    . Omega-3 Fatty Acids (FISH OIL CONCENTRATE) 1000 MG CAPS Take 4,000 mg by mouth.     No current facility-administered medications for this visit.     Medical Decision Making:  Established Problem, Stable/Improving (1), Review of Medication Regimen & Side Effects (2) and Review of New Medication or Change in Dosage (2)  Treatment Plan Summary:Medication management and Plan   Major depressive Disorder, recurrent in partial remission  Continue bupropion to 200mg  po qd.  Continue cymbalta at 60mg  once daily. Patient has obtained this medication through her friend elsewhere and does not need a refill on it. Patient educated about tweaking medications on her own. She was advised that she needs to start something slowly at a low dose and increasing and not restart her medications at such high dosages.  Patient encouraged to start seeing a therapist.   Insomnia- start Ativan at 0.5 mg at bedtime as needed  Patient will follow up in 2 months. Patient to call before with any questions   Jamyra Zweig 05/01/2016, 3:00 PM

## 2016-05-15 ENCOUNTER — Telehealth: Payer: Self-pay

## 2016-05-15 NOTE — Telephone Encounter (Signed)
pt needs a new rx sent to [facility] for 90 day supply for duloxetine 60mg 

## 2016-05-16 ENCOUNTER — Other Ambulatory Visit (HOSPITAL_COMMUNITY): Payer: Self-pay | Admitting: Psychiatry

## 2016-05-16 ENCOUNTER — Telehealth: Payer: Self-pay

## 2016-05-16 MED ORDER — DULOXETINE HCL 60 MG PO CPEP: 60.0000 mg | ORAL_CAPSULE | Freq: Every day | ORAL | 1 refills | Status: DC

## 2016-05-16 NOTE — Telephone Encounter (Signed)
received a request for a 90 day supply to [facility] for theduloxetine cap 60mg  take 1 capsule by mouth daily .

## 2016-05-16 NOTE — Telephone Encounter (Signed)
I just put in the prescription

## 2016-06-25 DIAGNOSIS — I471 Supraventricular tachycardia: Secondary | ICD-10-CM | POA: Insufficient documentation

## 2016-07-02 ENCOUNTER — Encounter: Payer: Self-pay | Admitting: Psychiatry

## 2016-07-02 ENCOUNTER — Ambulatory Visit (INDEPENDENT_AMBULATORY_CARE_PROVIDER_SITE_OTHER): Payer: 59 | Admitting: Psychiatry

## 2016-07-02 VITALS — BP 126/78 | HR 80 | Temp 97.4°F | Wt 161.8 lb

## 2016-07-02 DIAGNOSIS — F332 Major depressive disorder, recurrent severe without psychotic features: Secondary | ICD-10-CM | POA: Diagnosis not present

## 2016-07-02 MED ORDER — LORAZEPAM 0.5 MG PO TABS: 0.5000 mg | ORAL_TABLET | Freq: Every day | ORAL | 1 refills | Status: DC

## 2016-07-02 NOTE — Progress Notes (Signed)
Patient ID: Lori Wagner, female   DOB: 1951-02-25, 66 y.o.   MRN: [id number]   [facility] MD/PA/NP OP Progress Note  07/02/2016 2:40 PM Lori Wagner  MRN:  [id number]  Subjective:  Patient returns for follow-up of her major depressive disorder, recurrent in remission  and panic disorder. Patient reports that she's been doing okay overall.  states that she has been sleeping better on the Ativan and her nightmares have diminished as well. However she reports that she was started on primidone by her primary care physician for essential tremors and that has made her very sleepy during the day. She is due for an ablation procedure tomorrow and has some apprehension about it. She also reports that she has overextended herself and is working on decreasing her activities. Denies any suicidal thoughts. She is been active in volunteering in her community.   Chief Complaint: doing okay Chief Complaint    Follow-up; Medication Reaction     Visit Diagnosis:     ICD-9-CM ICD-10-CM   1. Major depressive disorder, recurrent, severe without psychotic features ([place]) 296.33 F33.2     Past Medical History:  Past Medical History:  Diagnosis Date  . Anxiety   . Arthritis    knees,hip , back and hands  . Chronic kidney disease    Hx of kidney stones, years ago  . Coronary artery disease   . Dysrhythmia    SVT- controlled with Beta blocker  . Fatigue   . HOH (hard of hearing)    tinnitis both ears  . Hypertension     can cause lightheadedness  . Motion sickness   . Neuromuscular disorder ([place])    carpal tunnel both hands  . PONV (postoperative nausea and vomiting)   . RA (rheumatoid arthritis) ([place])   . Sleep apnea    Hx of/ when overweight, no CPAP  . Thyroid disease    HX OF/ NO LONGER TAKING THYROID MEDS, THYROID TESTS CAME BACK NORMAL  . Vertigo    Hx of    Past Surgical History:  Procedure Laterality Date  . ANKLE SURGERY Left   . APPENDECTOMY    . BLADDER REPAIR    . CARDIAC  CATHETERIZATION    . CARDIAC SURGERY    . CORONARY ARTERY BYPASS GRAFT  2001   x4  . GASTRIC BYPASS     SLEEVE  . HAND SURGERY Right    2 TRIGGER FINGERS  . KNEE ARTHROSCOPY Right   . renal colic    . TRIGGER FINGER RELEASE Right 01/17/2016   Procedure: RELEASE TRIGGER FINGER/A-1 PULLEY THUMB;  Surgeon: Corky Mull, MD;  Location: [facility];  Service: Orthopedics;  Laterality: Right;   Family History:  Family History  Problem Relation Age of Onset  . Hypertension Mother   . Coronary artery disease Mother   . Arthritis Mother   . Osteoporosis Mother   . Depression Mother   . Anxiety disorder Mother   . Coronary artery disease Father   . Stroke Father   . Coronary artery disease Brother   . Bipolar disorder Brother   . Drug abuse Brother   . Coronary artery disease Maternal Aunt   . Osteoporosis Maternal Aunt   . Coronary artery disease Cousin   . Osteoporosis Cousin   . Anxiety disorder Daughter   . Depression Daughter    Social History:  Social History   Social History  . Marital status: Married    Spouse name: N/A  . Number  of children: N/A  . Years of education: N/A   Social History Main Topics  . Smoking status: Never Smoker  . Smokeless tobacco: Never Used  . Alcohol use No  . Drug use: No  . Sexual activity: No   Other Topics Concern  . None   Social History Narrative  . None   Additional History:   Assessment:   Musculoskeletal: Strength & Muscle Tone: within normal limits Gait & Station: normal Patient leans: N/A  Psychiatric Specialty Exam: Medication Refill   Depression         Associated symptoms include insomnia.  Associated symptoms include no suicidal ideas.   Review of Systems  Psychiatric/Behavioral: Negative for depression (at baseline and reporting more good days than bad), hallucinations, memory loss, substance abuse and suicidal ideas. The patient has insomnia. The patient is not nervous/anxious.   All other systems  reviewed and are negative.   Blood pressure 126/78, pulse 80, temperature 97.4 F (36.3 C), temperature source Oral, weight 161 lb 12.8 oz (73.4 kg).Body mass index is 26.92 kg/m.  General Appearance: Well Groomed  Eye Contact:  Good  Speech:  Normal Rate  Volume:  Normal  Mood:  better  Affect:  bright  Thought Process:  Linear and Logical  Orientation:  Full (Time, Place, and Person)  Thought Content:  Negative  Suicidal Thoughts:  No  Homicidal Thoughts:  No  Memory:  Immediate;   Good Recent;   Good Remote;   Good  Judgement:  Good  Insight:  Good  Psychomotor Activity:  Negative  Concentration:  Good  Recall:  Good  Fund of Knowledge: Good  Language: Good  Akathisia:  Negative  Handed:  Right  AIMS (if indicated):  N/A  Assets:  Communication Skills Desire for Improvement Social Support  ADL's:  Intact  Cognition: WNL  Sleep:  Good    Is the patient at risk to self?  No. Has the patient been a risk to self in the past 6 months?  No. Has the patient been a risk to self within the distant past?  No. Is the patient a risk to others?  No. Has the patient been a risk to others in the past 6 months?  No. Has the patient been a risk to others within the distant past?  No.  Current Medications: Current Outpatient Prescriptions  Medication Sig Dispense Refill  . aspirin 81 MG tablet Take 81 mg by mouth daily. 2 tablets daily /am    . atorvastatin (LIPITOR) 80 MG tablet Take 80 mg by mouth at bedtime.     Marland Kitchen buPROPion (WELLBUTRIN SR) 200 MG 12 hr tablet Take 1 tablet (200 mg total) by mouth every morning. 90 tablet 1  . calcium gluconate 500 MG tablet Take 1 tablet by mouth daily.    . Cholecalciferol (VITAMIN D-1000 MAX ST) 1000 UNITS tablet Take by mouth.    . DULoxetine (CYMBALTA) 60 MG capsule Take 1 capsule (60 mg total) by mouth daily. 90 capsule 1  . HYDROcodone-acetaminophen (NORCO) 5-325 MG tablet Take 1-2 tablets by mouth every 6 (six) hours as needed for  moderate pain. MAXIMUM TOTAL ACETAMINOPHEN DOSE IS 4000 MG PER DAY 30 tablet 0  . isosorbide mononitrate (IMDUR) 30 MG 24 hr tablet Take 30 mg by mouth daily. am    . LORazepam (ATIVAN) 0.5 MG tablet Take 1 tablet (0.5 mg total) by mouth at bedtime. 30 tablet 1  . metoprolol succinate (TOPROL-XL) 50 MG 24 hr tablet Take  25 mg by mouth daily. At night    . Multiple Vitamin (MULTI-VITAMIN DAILY PO) Take by mouth.    . nitroGLYCERIN (NITROSTAT) 0.4 MG SL tablet Place 0.4 mg under the tongue every 5 (five) minutes as needed for chest pain.    . Omega-3 Fatty Acids (FISH OIL CONCENTRATE) 1000 MG CAPS Take 4,000 mg by mouth.     No current facility-administered medications for this visit.     Medical Decision Making:  Established Problem, Stable/Improving (1), Review of Medication Regimen & Side Effects (2) and Review of New Medication or Change in Dosage (2)  Treatment Plan Summary:Medication management and Plan   Major depressive Disorder, recurrent in partial remission  Continue bupropion to 200mg  po qd.  Continue cymbalta at 60mg  once daily.   Insomnia- Continue  Ativan at 0.5 mg at bedtime as needed  Patient will follow up in 2 months. Patient to call before with any questions   Manda Holstad 07/02/2016, 2:40 PM

## 2016-07-03 ENCOUNTER — Other Ambulatory Visit: Payer: Self-pay

## 2016-07-03 MED ORDER — BUPROPION HCL ER (SR) 200 MG PO TB12: 200.0000 mg | ORAL_TABLET | Freq: Every morning | ORAL | 0 refills | Status: DC

## 2016-07-03 NOTE — Telephone Encounter (Signed)
received a fax requesting a 90 day supply of the bupropion st tab .  Pt has an appt on  08-30-16.

## 2016-08-07 DIAGNOSIS — R251 Tremor, unspecified: Secondary | ICD-10-CM | POA: Insufficient documentation

## 2016-08-21 ENCOUNTER — Other Ambulatory Visit: Payer: Self-pay | Admitting: Psychiatry

## 2016-08-30 ENCOUNTER — Ambulatory Visit (INDEPENDENT_AMBULATORY_CARE_PROVIDER_SITE_OTHER): Payer: 59 | Admitting: Psychiatry

## 2016-08-30 ENCOUNTER — Encounter: Payer: Self-pay | Admitting: Psychiatry

## 2016-08-30 VITALS — BP 121/77 | HR 69 | Wt 158.4 lb

## 2016-08-30 DIAGNOSIS — F410 Panic disorder [episodic paroxysmal anxiety] without agoraphobia: Secondary | ICD-10-CM | POA: Diagnosis not present

## 2016-08-30 DIAGNOSIS — F332 Major depressive disorder, recurrent severe without psychotic features: Secondary | ICD-10-CM

## 2016-08-30 MED ORDER — BUPROPION HCL ER (SR) 200 MG PO TB12: 200.0000 mg | ORAL_TABLET | Freq: Every morning | ORAL | 0 refills | Status: DC

## 2016-08-30 MED ORDER — DULOXETINE HCL 60 MG PO CPEP: 60.0000 mg | ORAL_CAPSULE | Freq: Every day | ORAL | 1 refills | Status: DC

## 2016-08-30 MED ORDER — LORAZEPAM 0.5 MG PO TABS: 0.5000 mg | ORAL_TABLET | Freq: Every day | ORAL | 1 refills | Status: DC

## 2016-08-30 NOTE — Progress Notes (Signed)
Patient ID: Lori Wagner, female   DOB: 09/04/1950, 66 y.o.   MRN: [id number]   [facility] MD/PA/NP OP Progress Note  08/30/2016 11:03 AM Lori Wagner  MRN:  [id number]  Subjective:  Patient returns for follow-up of her major depressive disorder, recurrent in remission  and panic disorder. Patient reports that she's been doing okay overall. Reports she has been feeling a bit down. Her ablation procedure was not successful and she is now on Digoxin. Sleeping well. Denies any suicidal thoughts. We talked about her seeing a therapist and she says she has cycles of feeling down but she has got much better.  Chief Complaint: doing okay Chief Complaint    Follow-up; Medication Refill     Visit Diagnosis:     ICD-9-CM ICD-10-CM   1. Major depressive disorder, recurrent, severe without psychotic features ([place]) 296.33 F33.2   2. Panic disorder 300.01 F41.0     Past Medical History:  Past Medical History:  Diagnosis Date  . Anxiety   . Arthritis    knees,hip , back and hands  . Chronic kidney disease    Hx of kidney stones, years ago  . Coronary artery disease   . Dysrhythmia    SVT- controlled with Beta blocker  . Fatigue   . HOH (hard of hearing)    tinnitis both ears  . Hypertension     can cause lightheadedness  . Motion sickness   . Neuromuscular disorder ([place])    carpal tunnel both hands  . PONV (postoperative nausea and vomiting)   . RA (rheumatoid arthritis) ([place])   . Sleep apnea    Hx of/ when overweight, no CPAP  . Thyroid disease    HX OF/ NO LONGER TAKING THYROID MEDS, THYROID TESTS CAME BACK NORMAL  . Vertigo    Hx of    Past Surgical History:  Procedure Laterality Date  . ANKLE SURGERY Left   . APPENDECTOMY    . BLADDER REPAIR    . CARDIAC CATHETERIZATION    . CARDIAC SURGERY    . CORONARY ARTERY BYPASS GRAFT  2001   x4  . GASTRIC BYPASS     SLEEVE  . HAND SURGERY Right    2 TRIGGER FINGERS  . KNEE ARTHROSCOPY Right   . renal colic    . TRIGGER  FINGER RELEASE Right 01/17/2016   Procedure: RELEASE TRIGGER FINGER/A-1 PULLEY THUMB;  Surgeon: Corky Mull, MD;  Location: [place];  Service: Orthopedics;  Laterality: Right;   Family History:  Family History  Problem Relation Age of Onset  . Hypertension Mother   . Coronary artery disease Mother   . Arthritis Mother   . Osteoporosis Mother   . Depression Mother   . Anxiety disorder Mother   . Coronary artery disease Father   . Stroke Father   . Coronary artery disease Brother   . Bipolar disorder Brother   . Drug abuse Brother   . Coronary artery disease Maternal Aunt   . Osteoporosis Maternal Aunt   . Coronary artery disease Cousin   . Osteoporosis Cousin   . Anxiety disorder Daughter   . Depression Daughter    Social History:  Social History   Social History  . Marital status: Married    Spouse name: N/A  . Number of children: N/A  . Years of education: N/A   Social History Main Topics  . Smoking status: Never Smoker  . Smokeless tobacco: Never Used  . Alcohol use No  .  Drug use: No  . Sexual activity: No   Other Topics Concern  . None   Social History Narrative  . None   Additional History:   Assessment:   Musculoskeletal: Strength & Muscle Tone: within normal limits Gait & Station: normal Patient leans: N/A  Psychiatric Specialty Exam: Medication Refill   Depression         Associated symptoms include insomnia.  Associated symptoms include no suicidal ideas.   Review of Systems  Psychiatric/Behavioral: Negative for depression (at baseline and reporting more good days than bad), hallucinations, memory loss, substance abuse and suicidal ideas. The patient has insomnia. The patient is not nervous/anxious.   All other systems reviewed and are negative.   Blood pressure 121/77, pulse 69, weight 158 lb 6.4 oz (71.8 kg).Body mass index is 26.36 kg/m.  General Appearance: Well Groomed  Eye Contact:  Good  Speech:  Normal Rate  Volume:   Normal  Mood:  better  Affect:  bright  Thought Process:  Linear and Logical  Orientation:  Full (Time, Place, and Person)  Thought Content:  Negative  Suicidal Thoughts:  No  Homicidal Thoughts:  No  Memory:  Immediate;   Good Recent;   Good Remote;   Good  Judgement:  Good  Insight:  Good  Psychomotor Activity:  Negative  Concentration:  Good  Recall:  Good  Fund of Knowledge: Good  Language: Good  Akathisia:  Negative  Handed:  Right  AIMS (if indicated):  N/A  Assets:  Communication Skills Desire for Improvement Social Support  ADL's:  Intact  Cognition: WNL  Sleep:  Good    Is the patient at risk to self?  No. Has the patient been a risk to self in the past 6 months?  No. Has the patient been a risk to self within the distant past?  No. Is the patient a risk to others?  No. Has the patient been a risk to others in the past 6 months?  No. Has the patient been a risk to others within the distant past?  No.  Current Medications: Current Outpatient Prescriptions  Medication Sig Dispense Refill  . aspirin 81 MG tablet Take 81 mg by mouth daily. 2 tablets daily /am    . atorvastatin (LIPITOR) 80 MG tablet Take 80 mg by mouth at bedtime.     Marland Kitchen buPROPion (WELLBUTRIN SR) 200 MG 12 hr tablet Take 1 tablet (200 mg total) by mouth every morning. 90 tablet 0  . calcium gluconate 500 MG tablet Take 1 tablet by mouth daily.    . Cholecalciferol (VITAMIN D-1000 MAX ST) 1000 UNITS tablet Take by mouth.    . digoxin (LANOXIN) 0.125 MG tablet     . DULoxetine (CYMBALTA) 60 MG capsule Take 1 capsule (60 mg total) by mouth daily. 90 capsule 1  . gabapentin (NEURONTIN) 100 MG capsule Take 100 mg twice a day for one week, then increase to 200 mg(2 tablets) twice a day and continue    . gabapentin (NEURONTIN) 300 MG capsule Take 300 mg by mouth.    . isosorbide mononitrate (IMDUR) 30 MG 24 hr tablet Take 30 mg by mouth daily. am    . LORazepam (ATIVAN) 0.5 MG tablet Take 1 tablet (0.5  mg total) by mouth at bedtime. 30 tablet 1  . metoprolol succinate (TOPROL-XL) 50 MG 24 hr tablet Take 25 mg by mouth daily. At night    . Multiple Vitamin (MULTI-VITAMIN DAILY PO) Take by mouth.    Marland Kitchen  nitroGLYCERIN (NITROSTAT) 0.4 MG SL tablet Place 0.4 mg under the tongue every 5 (five) minutes as needed for chest pain.    . Omega-3 Fatty Acids (FISH OIL CONCENTRATE) 1000 MG CAPS Take 4,000 mg by mouth.     No current facility-administered medications for this visit.     Medical Decision Making:  Established Problem, Stable/Improving (1), Review of Medication Regimen & Side Effects (2) and Review of New Medication or Change in Dosage (2)  Treatment Plan Summary:Medication management and Plan   Major depressive Disorder, recurrent in partial remission  Continue bupropion at 200mg  po qd.  Continue cymbalta at 60mg  once daily.   Insomnia- Continue  Ativan at 0.5 mg at bedtime as needed  Patient will follow up in 2 months. Patient to call before with any questions   Edwen Mclester 08/30/2016, 11:03 AM

## 2016-09-23 ENCOUNTER — Other Ambulatory Visit: Payer: Self-pay | Admitting: Psychiatry

## 2016-09-24 ENCOUNTER — Other Ambulatory Visit: Payer: Self-pay | Admitting: Psychiatry

## 2016-09-25 NOTE — Telephone Encounter (Signed)
Pt needs refill on ativan. Per the pharmacy they did not receive it.

## 2016-09-26 ENCOUNTER — Other Ambulatory Visit (HOSPITAL_COMMUNITY): Payer: Self-pay | Admitting: Psychiatry

## 2016-09-26 MED ORDER — LORAZEPAM 0.5 MG PO TABS: 0.5000 mg | ORAL_TABLET | Freq: Every day | ORAL | 1 refills | Status: DC

## 2016-09-26 NOTE — Telephone Encounter (Signed)
She does not take buspar

## 2016-09-26 NOTE — Telephone Encounter (Signed)
faxed and confirmed rx sent ativan .5mg  id # [id number] order # [id number]

## 2016-10-30 ENCOUNTER — Encounter: Payer: Self-pay | Admitting: Psychiatry

## 2016-10-30 ENCOUNTER — Ambulatory Visit (INDEPENDENT_AMBULATORY_CARE_PROVIDER_SITE_OTHER): Payer: 59 | Admitting: Psychiatry

## 2016-10-30 VITALS — BP 113/71 | HR 62 | Temp 97.7°F | Wt 160.2 lb

## 2016-10-30 DIAGNOSIS — F332 Major depressive disorder, recurrent severe without psychotic features: Secondary | ICD-10-CM | POA: Diagnosis not present

## 2016-10-30 MED ORDER — DULOXETINE HCL 60 MG PO CPEP: 60.0000 mg | ORAL_CAPSULE | Freq: Every day | ORAL | 1 refills | Status: DC

## 2016-10-30 MED ORDER — BUPROPION HCL ER (SR) 200 MG PO TB12: 200.0000 mg | ORAL_TABLET | Freq: Every morning | ORAL | 0 refills | Status: DC

## 2016-10-30 NOTE — Progress Notes (Signed)
Patient ID: Lori Wagner, female   DOB: December 02, 1950, 66 y.o.   MRN: [id number]   [facility] MD/PA/NP OP Progress Note  10/30/2016 10:59 AM Lori Wagner  MRN:  [id number]  Subjective:  Patient returns for follow-up of her major depressive disorder, recurrent in remission  and panic disorder. She reports doing well overall. States that down when it is Sunday she does quite well and then it is cloudy and rainy she tends to get a bit depressed. 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 Fair sleep and appetite. She is looking forward to a trip to [place] on a cruise. Continues to have experience anxiety prior to any trauma. States that there is no particular trigger for that. Denies any suicidal thoughts.  Chief Complaint: doing okay Chief Complaint    Follow-up; Medication Refill     Visit Diagnosis:     ICD-10-CM   1. Major depressive disorder, recurrent, severe without psychotic features ([place]) F33.2     Past Medical History:  Past Medical History:  Diagnosis Date  . Anxiety   . Arthritis    knees,hip , back and hands  . Chronic kidney disease    Hx of kidney stones, years ago  . Coronary artery disease   . Dysrhythmia    SVT- controlled with Beta blocker  . Fatigue   . HOH (hard of hearing)    tinnitis both ears  . Hypertension     can cause lightheadedness  . Motion sickness   . Neuromuscular disorder ([place])    carpal tunnel both hands  . PONV (postoperative nausea and vomiting)   . RA (rheumatoid arthritis) ([place])   . Sleep apnea    Hx of/ when overweight, no CPAP  . Thyroid disease    HX OF/ NO LONGER TAKING THYROID MEDS, THYROID TESTS CAME BACK NORMAL  . Vertigo    Hx of    Past Surgical History:  Procedure Laterality Date  . ANKLE SURGERY Left   . APPENDECTOMY    . BLADDER REPAIR    . CARDIAC CATHETERIZATION    . CARDIAC SURGERY    . CORONARY ARTERY BYPASS GRAFT  2001   x4  . GASTRIC BYPASS     SLEEVE  . HAND SURGERY  Right    2 TRIGGER FINGERS  . KNEE ARTHROSCOPY Right   . renal colic    . TRIGGER FINGER RELEASE Right 01/17/2016   Procedure: RELEASE TRIGGER FINGER/A-1 PULLEY THUMB;  Surgeon: Corky Mull, MD;  Location: [place];  Service: Orthopedics;  Laterality: Right;   Family History:  Family History  Problem Relation Age of Onset  . Hypertension Mother   . Coronary artery disease Mother   . Arthritis Mother   . Osteoporosis Mother   . Depression Mother   . Anxiety disorder Mother   . Coronary artery disease Father   . Stroke Father   . Coronary artery disease Brother   . Bipolar disorder Brother   . Drug abuse Brother   . Coronary artery disease Maternal Aunt   . Osteoporosis Maternal Aunt   . Coronary artery disease Cousin   . Osteoporosis Cousin   . Anxiety disorder Daughter   . Depression Daughter    Social History:  Social History   Social History  . Marital status: Married    Spouse name: N/A  . Number of children: N/A  . Years of education: N/A   Social History Main Topics  . Smoking  status: Never Smoker  . Smokeless tobacco: Never Used  . Alcohol use No  . Drug use: No  . Sexual activity: No   Other Topics Concern  . None   Social History Narrative  . None   Additional History:   Assessment:   Musculoskeletal: Strength & Muscle Tone: within normal limits Gait & Station: normal Patient leans: N/A  Psychiatric Specialty Exam: Medication Refill   Depression         Associated symptoms include insomnia.  Associated symptoms include no suicidal ideas.   Review of Systems  Psychiatric/Behavioral: Negative for depression (at baseline and reporting more good days than bad), hallucinations, memory loss, substance abuse and suicidal ideas. The patient has insomnia. The patient is not nervous/anxious.   All other systems reviewed and are negative.   Blood pressure 113/71, pulse 62, temperature 97.7 F (36.5 C), temperature source Oral, weight 160  lb 3.2 oz (72.7 kg).Body mass index is 26.66 kg/m.  General Appearance: Well Groomed  Eye Contact:  Good  Speech:  Normal Rate  Volume:  Normal  Mood:  better  Affect:  bright  Thought Process:  Linear and Logical  Orientation:  Full (Time, Place, and Person)  Thought Content:  Negative  Suicidal Thoughts:  No  Homicidal Thoughts:  No  Memory:  Immediate;   Good Recent;   Good Remote;   Good  Judgement:  Good  Insight:  Good  Psychomotor Activity:  Negative  Concentration:  Good  Recall:  Good  Fund of Knowledge: Good  Language: Good  Akathisia:  Negative  Handed:  Right  AIMS (if indicated):  N/A  Assets:  Communication Skills Desire for Improvement Social Support  ADL's:  Intact  Cognition: WNL  Sleep:  Good    Is the patient at risk to self?  No. Has the patient been a risk to self in the past 6 months?  No. Has the patient been a risk to self within the distant past?  No. Is the patient a risk to others?  No. Has the patient been a risk to others in the past 6 months?  No. Has the patient been a risk to others within the distant past?  No.  Current Medications: Current Outpatient Prescriptions  Medication Sig Dispense Refill  . aspirin 81 MG tablet Take 81 mg by mouth daily. 2 tablets daily /am    . atorvastatin (LIPITOR) 80 MG tablet Take 80 mg by mouth at bedtime.     Marland Kitchen buPROPion (WELLBUTRIN SR) 200 MG 12 hr tablet Take 1 tablet (200 mg total) by mouth every morning. 90 tablet 0  . calcium gluconate 500 MG tablet Take 1 tablet by mouth daily.    . Cholecalciferol (VITAMIN D-1000 MAX ST) 1000 UNITS tablet Take by mouth.    . digoxin (LANOXIN) 0.125 MG tablet     . DULoxetine (CYMBALTA) 60 MG capsule Take 1 capsule (60 mg total) by mouth daily. 90 capsule 1  . gabapentin (NEURONTIN) 100 MG capsule Take 100 mg twice a day for one week, then increase to 200 mg(2 tablets) twice a day and continue    . gabapentin (NEURONTIN) 300 MG capsule Take 300 mg by mouth.     . isosorbide mononitrate (IMDUR) 30 MG 24 hr tablet Take 30 mg by mouth daily. am    . LORazepam (ATIVAN) 0.5 MG tablet Take 1 tablet (0.5 mg total) by mouth at bedtime. 30 tablet 1  . metoprolol succinate (TOPROL-XL) 50 MG 24 hr  tablet Take 25 mg by mouth daily. At night    . Multiple Vitamin (MULTI-VITAMIN DAILY PO) Take by mouth.    . nitroGLYCERIN (NITROSTAT) 0.4 MG SL tablet Place 0.4 mg under the tongue every 5 (five) minutes as needed for chest pain.    . Omega-3 Fatty Acids (FISH OIL CONCENTRATE) 1000 MG CAPS Take 4,000 mg by mouth.     No current facility-administered medications for this visit.     Medical Decision Making:  Established Problem, Stable/Improving (1), Review of Medication Regimen & Side Effects (2) and Review of New Medication or Change in Dosage (2)  Treatment Plan Summary:Medication management and Plan   Major depressive Disorder, recurrent in partial remission  Continue bupropion at 200mg  po qd.  Continue cymbalta at 60mg  once daily.   Insomnia- Continue  Ativan at 0.5 mg at bedtime as needed  We discussed that patient start seeing a therapist to address her trauma related to her last job and issues surrounding that. Patient is agreeable to this plan.  Patient will follow up in 2 months. Patient to call before with any questions   Akyra Bouchie 10/30/2016, 10:59 AM

## 2016-12-02 ENCOUNTER — Other Ambulatory Visit: Payer: Self-pay | Admitting: Psychiatry

## 2016-12-18 ENCOUNTER — Other Ambulatory Visit: Payer: Self-pay | Admitting: Psychiatry

## 2016-12-30 ENCOUNTER — Ambulatory Visit (INDEPENDENT_AMBULATORY_CARE_PROVIDER_SITE_OTHER): Payer: 59 | Admitting: Psychiatry

## 2016-12-30 ENCOUNTER — Encounter: Payer: Self-pay | Admitting: Psychiatry

## 2016-12-30 VITALS — BP 127/78 | HR 60 | Temp 97.8°F | Wt 159.6 lb

## 2016-12-30 DIAGNOSIS — F332 Major depressive disorder, recurrent severe without psychotic features: Secondary | ICD-10-CM

## 2016-12-30 MED ORDER — DULOXETINE HCL 60 MG PO CPEP: 60.0000 mg | ORAL_CAPSULE | Freq: Every day | ORAL | 0 refills | Status: DC

## 2016-12-30 MED ORDER — BUPROPION HCL ER (SR) 200 MG PO TB12: 200.0000 mg | ORAL_TABLET | Freq: Every morning | ORAL | 0 refills | Status: DC

## 2016-12-30 NOTE — Progress Notes (Signed)
Patient ID: Lori Wagner, female   DOB: Oct 09, 1950, 66 y.o.   MRN: [id number]   [facility] MD/PA/NP OP Progress Note  12/30/2016 10:27 AM Lori Wagner  MRN:  [id number]  Subjective:  Patient returns for follow-up of her major depressive disorder, recurrent in remission  and panic disorder. She reports doing well overall. Face that she had a good trip to [place] States it was very cold and [place]. Reports some mom back pain and some stiffness in the morning. She reports that she has weaned herself off the Ativan. Sleeping okay but sometimes struggles. Denies any suicidal thoughts.  Chief Complaint: doing okay Chief Complaint    Follow-up; Medication Refill     Visit Diagnosis:     ICD-10-CM   1. Major depressive disorder, recurrent, severe without psychotic features ([place]) F33.2     Past Medical History:  Past Medical History:  Diagnosis Date  . Anxiety   . Arthritis    knees,hip , back and hands  . Chronic kidney disease    Hx of kidney stones, years ago  . Coronary artery disease   . Dysrhythmia    SVT- controlled with Beta blocker  . Fatigue   . HOH (hard of hearing)    tinnitis both ears  . Hypertension     can cause lightheadedness  . Motion sickness   . Neuromuscular disorder ([place])    carpal tunnel both hands  . PONV (postoperative nausea and vomiting)   . RA (rheumatoid arthritis) ([place])   . Sleep apnea    Hx of/ when overweight, no CPAP  . Thyroid disease    HX OF/ NO LONGER TAKING THYROID MEDS, THYROID TESTS CAME BACK NORMAL  . Vertigo    Hx of    Past Surgical History:  Procedure Laterality Date  . ANKLE SURGERY Left   . APPENDECTOMY    . BLADDER REPAIR    . CARDIAC CATHETERIZATION    . CARDIAC SURGERY    . CORONARY ARTERY BYPASS GRAFT  2001   x4  . GASTRIC BYPASS     SLEEVE  . HAND SURGERY Right    2 TRIGGER FINGERS  . KNEE ARTHROSCOPY Right   . renal colic    . TRIGGER FINGER RELEASE Right 01/17/2016   Procedure: RELEASE TRIGGER FINGER/A-1  PULLEY THUMB;  Surgeon: Corky Mull, MD;  Location: [place];  Service: Orthopedics;  Laterality: Right;   Family History:  Family History  Problem Relation Age of Onset  . Hypertension Mother   . Coronary artery disease Mother   . Arthritis Mother   . Osteoporosis Mother   . Depression Mother   . Anxiety disorder Mother   . Coronary artery disease Father   . Stroke Father   . Coronary artery disease Brother   . Bipolar disorder Brother   . Drug abuse Brother   . Coronary artery disease Maternal Aunt   . Osteoporosis Maternal Aunt   . Coronary artery disease Cousin   . Osteoporosis Cousin   . Anxiety disorder Daughter   . Depression Daughter    Social History:  Social History   Social History  . Marital status: Married    Spouse name: N/A  . Number of children: N/A  . Years of education: N/A   Social History Main Topics  . Smoking status: Never Smoker  . Smokeless tobacco: Never Used  . Alcohol use No  . Drug use: No  . Sexual activity: No   Other Topics Concern  .  None   Social History Narrative  . None   Additional History:   Assessment:   Musculoskeletal: Strength & Muscle Tone: within normal limits Gait & Station: normal Patient leans: N/A  Psychiatric Specialty Exam: Medication Refill   Depression         Associated symptoms include insomnia.  Associated symptoms include no suicidal ideas.   Review of Systems  Psychiatric/Behavioral: Negative for depression (at baseline and reporting more good days than bad), hallucinations, memory loss, substance abuse and suicidal ideas. The patient has insomnia. The patient is not nervous/anxious.   All other systems reviewed and are negative.   Blood pressure 127/78, pulse 60, temperature 97.8 F (36.6 C), temperature source Oral, weight 159 lb 9.6 oz (72.4 kg).Body mass index is 26.56 kg/m.  General Appearance: Well Groomed  Eye Contact:  Good  Speech:  Normal Rate  Volume:  Normal  Mood:   better  Affect:  bright  Thought Process:  Linear and Logical  Orientation:  Full (Time, Place, and Person)  Thought Content:  Negative  Suicidal Thoughts:  No  Homicidal Thoughts:  No  Memory:  Immediate;   Good Recent;   Good Remote;   Good  Judgement:  Good  Insight:  Good  Psychomotor Activity:  Negative  Concentration:  Good  Recall:  Good  Fund of Knowledge: Good  Language: Good  Akathisia:  Negative  Handed:  Right  AIMS (if indicated):  N/A  Assets:  Communication Skills Desire for Improvement Social Support  ADL's:  Intact  Cognition: WNL  Sleep:  Good    Is the patient at risk to self?  No. Has the patient been a risk to self in the past 6 months?  No. Has the patient been a risk to self within the distant past?  No. Is the patient a risk to others?  No. Has the patient been a risk to others in the past 6 months?  No. Has the patient been a risk to others within the distant past?  No.  Current Medications: Current Outpatient Prescriptions  Medication Sig Dispense Refill  . aspirin 81 MG tablet Take 81 mg by mouth daily. 2 tablets daily /am    . atorvastatin (LIPITOR) 80 MG tablet Take 80 mg by mouth at bedtime.     Marland Kitchen buPROPion (WELLBUTRIN SR) 200 MG 12 hr tablet Take 1 tablet (200 mg total) by mouth every morning. 90 tablet 0  . calcium gluconate 500 MG tablet Take 1 tablet by mouth daily.    . Cholecalciferol (VITAMIN D-1000 MAX ST) 1000 UNITS tablet Take by mouth.    . digoxin (LANOXIN) 0.125 MG tablet     . DULoxetine (CYMBALTA) 60 MG capsule Take 1 capsule (60 mg total) by mouth daily. 90 capsule 1  . gabapentin (NEURONTIN) 100 MG capsule Take 100 mg twice a day for one week, then increase to 200 mg(2 tablets) twice a day and continue    . gabapentin (NEURONTIN) 300 MG capsule Take 300 mg by mouth.    . isosorbide mononitrate (IMDUR) 30 MG 24 hr tablet Take 30 mg by mouth daily. am    . LORazepam (ATIVAN) 0.5 MG tablet Take 1 tablet (0.5 mg total) by  mouth at bedtime. 30 tablet 1  . metoprolol succinate (TOPROL-XL) 50 MG 24 hr tablet Take 25 mg by mouth daily. At night    . Multiple Vitamin (MULTI-VITAMIN DAILY PO) Take by mouth.    . nitroGLYCERIN (NITROSTAT) 0.4 MG SL tablet  Place 0.4 mg under the tongue every 5 (five) minutes as needed for chest pain.    . Omega-3 Fatty Acids (FISH OIL CONCENTRATE) 1000 MG CAPS Take 4,000 mg by mouth.     No current facility-administered medications for this visit.     Medical Decision Making:  Established Problem, Stable/Improving (1), Review of Medication Regimen & Side Effects (2) and Review of New Medication or Change in Dosage (2)  Treatment Plan Summary:Medication management and Plan   Major depressive Disorder, recurrent in partial remission  Continue bupropion at 200mg  po qd.  Continue cymbalta at 60mg  once daily.   Insomnia- Discontinue the ativan, patient has weaned off it.  Patient will follow up in 3 months. Patient to call before with any questions   Deavon Podgorski 12/30/2016, 10:27 AM

## 2017-01-05 ENCOUNTER — Emergency Department: Payer: Medicare Other

## 2017-01-05 ENCOUNTER — Emergency Department
Admission: EM | Admit: 2017-01-05 | Discharge: 2017-01-05 | Disposition: A | Discharge status: Home / Self Care | Payer: Medicare Other | Source: Home / Self Care | Attending: Emergency Medicine | Admitting: Emergency Medicine

## 2017-01-05 ENCOUNTER — Encounter: Payer: Self-pay | Admitting: Radiology

## 2017-01-05 DIAGNOSIS — I471 Supraventricular tachycardia: Secondary | ICD-10-CM | POA: Insufficient documentation

## 2017-01-05 DIAGNOSIS — K5282 Eosinophilic colitis: Secondary | ICD-10-CM | POA: Insufficient documentation

## 2017-01-05 DIAGNOSIS — K5900 Constipation, unspecified: Secondary | ICD-10-CM

## 2017-01-05 DIAGNOSIS — I10 Essential (primary) hypertension: Secondary | ICD-10-CM

## 2017-01-05 DIAGNOSIS — K529 Noninfective gastroenteritis and colitis, unspecified: Secondary | ICD-10-CM

## 2017-01-05 DIAGNOSIS — Z79899 Other long term (current) drug therapy: Secondary | ICD-10-CM

## 2017-01-05 DIAGNOSIS — I2510 Atherosclerotic heart disease of native coronary artery without angina pectoris: Secondary | ICD-10-CM

## 2017-01-05 DIAGNOSIS — R109 Unspecified abdominal pain: Secondary | ICD-10-CM | POA: Diagnosis not present

## 2017-01-05 DIAGNOSIS — Z7982 Long term (current) use of aspirin: Secondary | ICD-10-CM | POA: Insufficient documentation

## 2017-01-05 DIAGNOSIS — Z951 Presence of aortocoronary bypass graft: Secondary | ICD-10-CM

## 2017-01-05 DIAGNOSIS — K5730 Diverticulosis of large intestine without perforation or abscess without bleeding: Secondary | ICD-10-CM | POA: Diagnosis not present

## 2017-01-05 LAB — CBC
HCT: 42.2 % (ref 35.0–47.0)
Hemoglobin: 14.4 g/dL (ref 12.0–16.0)
MCH: 31.6 pg (ref 26.0–34.0)
MCHC: 34.1 g/dL (ref 32.0–36.0)
MCV: 92.7 fL (ref 80.0–100.0)
PLATELETS: 239 10*3/uL (ref 150–440)
RBC: 4.55 MIL/uL (ref 3.80–5.20)
RDW: 12.9 % (ref 11.5–14.5)
WBC: 8.1 10*3/uL (ref 3.6–11.0)

## 2017-01-05 LAB — COMPREHENSIVE METABOLIC PANEL
ALBUMIN: 4 g/dL (ref 3.5–5.0)
ALT: 13 U/L — ABNORMAL LOW (ref 14–54)
AST: 23 U/L (ref 15–41)
Alkaline Phosphatase: 66 U/L (ref 38–126)
Anion gap: 8 (ref 5–15)
BILIRUBIN TOTAL: 1.2 mg/dL (ref 0.3–1.2)
BUN: 14 mg/dL (ref 6–20)
CALCIUM: 9.5 mg/dL (ref 8.9–10.3)
CO2: 28 mmol/L (ref 22–32)
Chloride: 106 mmol/L (ref 101–111)
Creatinine, Ser: 0.83 mg/dL (ref 0.44–1.00)
GFR calc Af Amer: >60 mL/min (ref >60)
GFR calc non Af Amer: >60 mL/min (ref >60)
GLUCOSE: 146 mg/dL — AB (ref 65–99)
Potassium: 4 mmol/L (ref 3.5–5.1)
Sodium: 142 mmol/L (ref 135–145)
TOTAL PROTEIN: 7.1 g/dL (ref 6.5–8.1)

## 2017-01-05 LAB — DIGOXIN LEVEL: DIGOXIN LVL: 0.6 ng/mL — AB (ref 0.8–2.0)

## 2017-01-05 LAB — LIPASE, BLOOD: Lipase: 44 U/L (ref 11–51)

## 2017-01-05 LAB — LACTIC ACID, PLASMA: LACTIC ACID, VENOUS: 1.7 mmol/L (ref 0.5–1.9)

## 2017-01-05 MED ORDER — CIPROFLOXACIN HCL 500 MG PO TABS: 500.0000 mg | ORAL_TABLET | Freq: Two times a day (BID) | ORAL | 0 refills | Status: DC

## 2017-01-05 MED ORDER — ONDANSETRON HCL 4 MG/2ML IJ SOLN
4.0000 mg | Freq: Once | INTRAMUSCULAR | Status: AC
  Administered 2017-01-05: 4 mg via INTRAVENOUS
  Filled 2017-01-05: qty 2

## 2017-01-05 MED ORDER — OXYCODONE-ACETAMINOPHEN 5-325 MG PO TABS: 1.0000 | ORAL_TABLET | Freq: Four times a day (QID) | ORAL | 0 refills | Status: DC | PRN

## 2017-01-05 MED ORDER — METRONIDAZOLE 500 MG PO TABS
500.0000 mg | ORAL_TABLET | Freq: Once | ORAL | Status: AC
  Administered 2017-01-05: 500 mg via ORAL
  Filled 2017-01-05: qty 1

## 2017-01-05 MED ORDER — MORPHINE SULFATE (PF) 4 MG/ML IV SOLN
4.0000 mg | Freq: Once | INTRAVENOUS | Status: AC
  Administered 2017-01-05: 4 mg via INTRAVENOUS
  Filled 2017-01-05: qty 1

## 2017-01-05 MED ORDER — IOPAMIDOL (ISOVUE-300) INJECTION 61%
100.0000 mL | Freq: Once | INTRAVENOUS | Status: AC | PRN
  Administered 2017-01-05: 100 mL via INTRAVENOUS

## 2017-01-05 MED ORDER — CIPROFLOXACIN HCL 500 MG PO TABS
500.0000 mg | ORAL_TABLET | Freq: Once | ORAL | Status: AC
  Administered 2017-01-05: 500 mg via ORAL
  Filled 2017-01-05: qty 1

## 2017-01-05 MED ORDER — BISACODYL 5 MG PO TBEC: 10.0000 mg | DELAYED_RELEASE_TABLET | Freq: Every day | ORAL | 0 refills | Status: DC | PRN

## 2017-01-05 MED ORDER — METRONIDAZOLE 500 MG PO TABS: 500.0000 mg | ORAL_TABLET | Freq: Three times a day (TID) | ORAL | 0 refills | Status: DC

## 2017-01-05 NOTE — ED Triage Notes (Signed)
Pt states that she started having abd  Pain and bloating 3 days ago, pt reports no bm in 4 days, pt states that when she passes gas she has pain and also states that she passed a small amount of diarrhea

## 2017-01-05 NOTE — ED Notes (Signed)
FIRST NURSE NOTE:  Pt here from [facility], c/o 4 days of constipation, n/v, here to r/o bowel obstruction

## 2017-01-05 NOTE — ED Notes (Signed)
Patient transported to CT 

## 2017-01-05 NOTE — ED Provider Notes (Signed)
[facility] Emergency Department Provider Note  ____________________________________________   First MD Initiated Contact with Patient 01/05/17 1541     (approximate)  I have reviewed the triage vital signs and the nursing notes.   HISTORY  Chief Complaint Abdominal Pain    HPI Lori Wagner is a 66 y.o. female who comes the emergency department with 3-4 days of moderate to severe diffuse abdominal pain. Pain is worse with movement and improved somewhat with rest. Over the past 3 days or so she is only passed small amounts of flatus followed by a small amount of diarrhea and has not had a normal bowel movement. She has had 3 abdominal surgeries in the past including appendectomy, hysterectomy, as well as a gastric sleeve. Nauseated but has not vomited.   Past Medical History:  Diagnosis Date  . Anxiety   . Arthritis    knees,hip , back and hands  . Chronic kidney disease    Hx of kidney stones, years ago  . Coronary artery disease   . Dysrhythmia    SVT- controlled with Beta blocker  . Fatigue   . HOH (hard of hearing)    tinnitis both ears  . Hypertension     can cause lightheadedness  . Motion sickness   . Neuromuscular disorder ([place])    carpal tunnel both hands  . PONV (postoperative nausea and vomiting)   . RA (rheumatoid arthritis) ([place])   . Sleep apnea    Hx of/ when overweight, no CPAP  . Thyroid disease    HX OF/ NO LONGER TAKING THYROID MEDS, THYROID TESTS CAME BACK NORMAL  . Vertigo    Hx of    Patient Active Problem List   Diagnosis Date Noted  . Tremor 08/07/2016  . SVT (supraventricular tachycardia) ([place]) 06/25/2016  . Trigger finger of right thumb 01/18/2016  . Carpal tunnel syndrome 10/13/2015  . Bilateral carpal tunnel syndrome 10/13/2015  . Arthralgia of multiple joints 07/03/2015  . CFIDS (chronic fatigue and immune dysfunction syndrome) ([place]) 07/03/2015  . Degeneration of intervertebral disc of lumbar region  07/03/2015  . Hand paresthesia 07/03/2015  . Major depressive disorder, recurrent, severe without psychotic features ([place]) 12/21/2014  . Panic disorder 12/21/2014  . Impingement syndrome of shoulder 07/22/2014  . Infraspinatus tenosynovitis 07/22/2014  . Impingement syndrome of left shoulder 07/22/2014  . Other synovitis and tenosynovitis, left shoulder 07/22/2014  . Left supraspinatus tenosynovitis 07/22/2014  . Acquired hypothyroidism 07/12/2014  . Pain in shoulder 07/12/2014  . Clinical depression 07/12/2014  . Combined fat and carbohydrate induced hyperlipemia 07/12/2014    Past Surgical History:  Procedure Laterality Date  . ANKLE SURGERY Left   . APPENDECTOMY    . BLADDER REPAIR    . CARDIAC CATHETERIZATION    . CARDIAC SURGERY    . CORONARY ARTERY BYPASS GRAFT  2001   x4  . GASTRIC BYPASS     SLEEVE  . HAND SURGERY Right    2 TRIGGER FINGERS  . KNEE ARTHROSCOPY Right   . renal colic    . TRIGGER FINGER RELEASE Right 01/17/2016   Procedure: RELEASE TRIGGER FINGER/A-1 PULLEY THUMB;  Surgeon: Corky Mull, MD;  Location: [facility];  Service: Orthopedics;  Laterality: Right;    Prior to Admission medications   Medication Sig Start Date End Date Taking? Authorizing Provider  aspirin 81 MG tablet Take 81 mg by mouth daily. 2 tablets daily /am    [provider]  atorvastatin (LIPITOR) 80  MG tablet Take 80 mg by mouth at bedtime.     [provider]  bisacodyl (DULCOLAX) 5 MG EC tablet Take 2 tablets (10 mg total) by mouth daily as needed for moderate constipation. 01/05/17 01/05/18  Darel Hong, MD  buPROPion (WELLBUTRIN SR) 200 MG 12 hr tablet Take 1 tablet (200 mg total) by mouth every morning. 12/30/16 12/30/17  Elvin So, MD  calcium gluconate 500 MG tablet Take 1 tablet by mouth daily.    [provider]  Cholecalciferol (VITAMIN D-1000 MAX ST) 1000 UNITS tablet Take by mouth.    [provider]  ciprofloxacin (CIPRO)  500 MG tablet Take 1 tablet (500 mg total) by mouth 2 (two) times daily. 01/05/17 01/15/17  Darel Hong, MD  digoxin (LANOXIN) 0.125 MG tablet  08/27/16   [provider]  DULoxetine (CYMBALTA) 60 MG capsule Take 1 capsule (60 mg total) by mouth daily. 12/30/16   Elvin So, MD  gabapentin (NEURONTIN) 100 MG capsule Take 100 mg twice a day for one week, then increase to 200 mg(2 tablets) twice a day and continue 08/07/16   [provider]  gabapentin (NEURONTIN) 300 MG capsule Take 300 mg by mouth.    [provider]  isosorbide mononitrate (IMDUR) 30 MG 24 hr tablet Take 30 mg by mouth daily. am    [provider]  metoprolol succinate (TOPROL-XL) 50 MG 24 hr tablet Take 25 mg by mouth daily. At night    [provider]  metroNIDAZOLE (FLAGYL) 500 MG tablet Take 1 tablet (500 mg total) by mouth 3 (three) times daily. 01/05/17 01/15/17  Darel Hong, MD  Multiple Vitamin (MULTI-VITAMIN DAILY PO) Take by mouth.    [provider]  nitroGLYCERIN (NITROSTAT) 0.4 MG SL tablet Place 0.4 mg under the tongue every 5 (five) minutes as needed for chest pain.    [provider]  Omega-3 Fatty Acids (FISH OIL CONCENTRATE) 1000 MG CAPS Take 4,000 mg by mouth.    [provider]  oxyCODONE-acetaminophen (ROXICET) 5-325 MG tablet Take 1 tablet by mouth every 6 (six) hours as needed. 01/05/17 01/05/18  Darel Hong, MD    Allergies Epinephrine; Ace inhibitors; Morphine and related; Penicillins; Sulfa antibiotics; and Cynara scolymus (artichoke)  Family History  Problem Relation Age of Onset  . Hypertension Mother   . Coronary artery disease Mother   . Arthritis Mother   . Osteoporosis Mother   . Depression Mother   . Anxiety disorder Mother   . Coronary artery disease Father   . Stroke Father   . Coronary artery disease Brother   . Bipolar disorder Brother   . Drug abuse Brother   . Coronary artery disease Maternal Aunt   .  Osteoporosis Maternal Aunt   . Coronary artery disease Cousin   . Osteoporosis Cousin   . Anxiety disorder Daughter   . Depression Daughter     Social History Social History  Substance Use Topics  . Smoking status: Never Smoker  . Smokeless tobacco: Never Used  . Alcohol use No    Review of Systems Constitutional: No fever/chills Eyes: No visual changes. ENT: No sore throat. Cardiovascular: Denies chest pain. Respiratory: Denies shortness of breath. Gastrointestinal: Positive abdominal pain.  Positive nausea, no vomiting.  Positive diarrhea.  Positive constipation. Genitourinary: Negative for dysuria. Musculoskeletal: Negative for back pain. Skin: Negative for rash. Neurological: Negative for headaches, focal weakness or numbness.   ____________________________________________   PHYSICAL EXAM:  VITAL SIGNS: ED Triage Vitals  Enc Vitals Group     BP 01/05/17 1520 (!) 105/55     Pulse Rate 01/05/17 1520 74     Resp 01/05/17 1520 18     Temp 01/05/17 1520 98.4 F (36.9 C)     Temp Source 01/05/17 1520 Oral     SpO2 01/05/17 1520 100 %     Weight 01/05/17 1521 155 lb (70.3 kg)     Height 01/05/17 1521 5\' 5"  (1.651 m)     Head Circumference --      Peak Flow --      Pain Score 01/05/17 1521 9     Pain Loc --      Pain Edu? --      Excl. in [place]? --     Constitutional: Alert and oriented 4 appears quite uncomfortable when sitting in grandma's Eyes: PERRL EOMI. Head: Atraumatic. Nose: No congestion/rhinnorhea. Mouth/Throat: No trismus Neck: No stridor.   Cardiovascular: Normal rate, regular rhythm. Grossly normal heart sounds.  Good peripheral circulation. Respiratory: Normal respiratory effort.  No retractions. Lungs CTAB and moving good air Gastrointestinal: Somewhat distended abdomen and diffusely exquisitely tender with no focality hyperactive bowel sounds Musculoskeletal: No lower extremity edema   Neurologic:  Normal speech and language. No gross focal  neurologic deficits are appreciated. Skin:  Skin is warm, dry and intact. No rash noted. Psychiatric: Mood and affect are normal. Speech and behavior are normal.    ____________________________________________   DIFFERENTIAL includes but not limited to  Small bowel obstruction, large bowel obstruction, constipation, volvulus, internal hernia, bowel ischemia ____________________________________________   LABS (all labs ordered are listed, but only abnormal results are displayed)  Labs Reviewed  COMPREHENSIVE METABOLIC PANEL - Abnormal; Notable for the following:       Result Value   Glucose, Bld 146 (*)    ALT 13 (*)    All other components within normal limits  DIGOXIN LEVEL - Abnormal; Notable for the following:    Digoxin Level 0.6 (*)    All other components within normal limits  LIPASE, BLOOD  CBC  LACTIC ACID, PLASMA  URINALYSIS, COMPLETE (UACMP) WITH MICROSCOPIC    No acute disease noted normal lactate  EKG   ____________________________________________  RADIOLOGY  CT scan shows likely proctocolitis but may represent rectal mass ____________________________________________   PROCEDURES  Procedure(s) performed: no  Procedures  Critical Care performed: no  Observation: no ____________________________________________   INITIAL IMPRESSION / ASSESSMENT AND PLAN / ED COURSE  Pertinent labs & imaging results that were available during my care of the patient were reviewed by me and considered in my medical decision making (see chart for details).  The patient arrives uncomfortable appearing with an exquisitely tender abdomen and obstipation concerning for small bowel obstruction. She will require CT scan with IV contrast to evaluate. Labs including a lactate are pending as well as IV morphine and Zofran.     The patient's CT scan is negative for acute surgical pathology. It appears to show what is likely proctocolitis although a rectal mass is not  excluded. The patient denies ever having anal receptive intercourse so doubt gonorrhea or chlamydia. She had a colonoscopy 10 years ago and was supposed to have another one however she had difficulty with the prep and had not scheduled for repeat colonoscopy yet. I had a lengthy discussion with the patient and her husband at bedside that I could treat her proctocolitis with antibiotics however it was critically important that she get at least a flexible sigmoidoscopy  within the next month to evaluate for a possible malignancy. The patient feels improved and is medically stable for outpatient management. ____________________________________________   FINAL CLINICAL IMPRESSION(S) / ED DIAGNOSES  Final diagnoses:  Proctocolitis  Constipation, unspecified constipation type      NEW MEDICATIONS STARTED DURING THIS VISIT:  New Prescriptions   BISACODYL (DULCOLAX) 5 MG EC TABLET    Take 2 tablets (10 mg total) by mouth daily as needed for moderate constipation.   CIPROFLOXACIN (CIPRO) 500 MG TABLET    Take 1 tablet (500 mg total) by mouth 2 (two) times daily.   METRONIDAZOLE (FLAGYL) 500 MG TABLET    Take 1 tablet (500 mg total) by mouth 3 (three) times daily.   OXYCODONE-ACETAMINOPHEN (ROXICET) 5-325 MG TABLET    Take 1 tablet by mouth every 6 (six) hours as needed.     Note:  This document was prepared using Dragon voice recognition software and may include unintentional dictation errors.     Darel Hong, MD 01/05/17 1750

## 2017-01-05 NOTE — Discharge Instructions (Signed)
Please take all of your antibiotics as prescribed and make an appointment to follow-up with your gastroenterologist within 1 week for reevaluation. It is critically important that you have a colonoscopy within the next month or 2. Keep your follow-up appointment with your primary care physician in 8 days as scheduled and return to the emergency department for any concerns whatsoever.  It was a pleasure to take care of you today, and thank you for coming to our emergency department.  If you have any questions or concerns before leaving please ask the nurse to grab me and I'm more than happy to go through your aftercare instructions again.  If you were prescribed any opioid pain medication today such as Norco, Vicodin, Percocet, morphine, hydrocodone, or oxycodone please make sure you do not drive when you are taking this medication as it can alter your ability to drive safely.  If you have any concerns once you are home that you are not improving or are in fact getting worse before you can make it to your follow-up appointment, please do not hesitate to call 911 and come back for further evaluation.  Darel Hong, MD  Results for orders placed or performed during the hospital encounter of 01/05/17  Lipase, blood  Result Value Ref Range   Lipase 44 11 - 51 U/L  Comprehensive metabolic panel  Result Value Ref Range   Sodium 142 135 - 145 mmol/L   Potassium 4.0 3.5 - 5.1 mmol/L   Chloride 106 101 - 111 mmol/L   CO2 28 22 - 32 mmol/L   Glucose, Bld 146 (H) 65 - 99 mg/dL   BUN 14 6 - 20 mg/dL   Creatinine, Ser 0.83 0.44 - 1.00 mg/dL   Calcium 9.5 8.9 - 10.3 mg/dL   Total Protein 7.1 6.5 - 8.1 g/dL   Albumin 4.0 3.5 - 5.0 g/dL   AST 23 15 - 41 U/L   ALT 13 (L) 14 - 54 U/L   Alkaline Phosphatase 66 38 - 126 U/L   Total Bilirubin 1.2 0.3 - 1.2 mg/dL   GFR calc non Af Amer >60 >60 mL/min   GFR calc Af Amer >60 >60 mL/min   Anion gap 8 5 - 15  CBC  Result Value Ref Range   WBC 8.1 3.6 -  11.0 K/uL   RBC 4.55 3.80 - 5.20 MIL/uL   Hemoglobin 14.4 12.0 - 16.0 g/dL   HCT 42.2 35.0 - 47.0 %   MCV 92.7 80.0 - 100.0 fL   MCH 31.6 26.0 - 34.0 pg   MCHC 34.1 32.0 - 36.0 g/dL   RDW 12.9 11.5 - 14.5 %   Platelets 239 150 - 440 K/uL  Digoxin level  Result Value Ref Range   Digoxin Level 0.6 (L) 0.8 - 2.0 ng/mL  Lactic acid, plasma  Result Value Ref Range   Lactic Acid, Venous 1.7 0.5 - 1.9 mmol/L   Ct Abdomen Pelvis W Contrast  Result Date: 01/05/2017 CLINICAL DATA:  66 year old female with history of abdominal pain and bloating for the past 3 days. No bowel movement and past 4 days. EXAM: CT ABDOMEN AND PELVIS WITH CONTRAST TECHNIQUE: Multidetector CT imaging of the abdomen and pelvis was performed using the standard protocol following bolus administration of intravenous contrast. CONTRAST:  190mL ISOVUE-300 IOPAMIDOL (ISOVUE-300) INJECTION 61% COMPARISON:  No priors. FINDINGS: Lower chest: Status post median sternotomy. Atherosclerotic calcifications in the left circumflex coronary artery. Hepatobiliary: In the periphery of segment 8 of the liver there  is an 8 mm lesion (axial image 4 of series 2) which appears to demonstrates some peripheral nodular enhancement, but is not visualized on delayed images, favored to represent a small cavernous hemangioma. No other larger more suspicious appearing cystic or solid hepatic lesions. No intra or extrahepatic biliary ductal dilatation. Gallbladder is normal in appearance. Pancreas: No pancreatic mass. Pancreatic duct is mildly dilated measuring up to 5 mm. No pancreatic or peripancreatic fluid or inflammatory changes. Spleen: Unremarkable. Adrenals/Urinary Tract: Bilateral kidneys and bilateral adrenal glands are normal in appearance. No hydroureteronephrosis. Urinary bladder is normal in appearance. Stomach/Bowel: Postoperative changes of sleeve gastrectomy are noted. No pathologic dilatation of small bowel or colon. The appendix is not confidently  identified and may be surgically absent. Regardless, there are no inflammatory changes noted adjacent to the cecum to suggest the presence of an acute appendicitis at this time. Several irregular areas of mural thickening are noted in the distal sigmoid colon and proximal rectum, best appreciated on axial image 69 of series 2 and coronal image 62 of series 5. There is associated hypervascularity of the sigmoid mesocolon and mesorectum. No definite diverticular disease is noted in this region. Slight haziness in the surrounding mesocolic fat is noted, suggesting inflammation. Moderate volume of well-formed stool noted throughout the colon, particularly distally. Vascular/Lymphatic: Aortic atherosclerosis, without definite aneurysm or dissection in the abdominal or pelvic vasculature. No lymphadenopathy noted in the abdomen or pelvis. Reproductive: Uterus and ovaries are unremarkable in appearance. Other: No significant volume of ascites.  No pneumoperitoneum. Musculoskeletal: There are no aggressive appearing lytic or blastic lesions noted in the visualized portions of the skeleton. Median sternotomy wires. IMPRESSION: 1. Profound thickening of the distal sigmoid colon and proximal rectum. There are subtle surrounding inflammatory changes in the adjacent mesocolic and mesorectal fat, as well as some hypervascularity, suggesting proctocolitis. However, the possibility of an underlying mass in this region is not excluded. Accordingly, follow-up nonemergent colonoscopy is recommended in the near future after resolution of the patient's acute symptoms to exclude the possibility of underlying neoplasm. 2. There is also mild dilatation of the pancreatic duct which measures up to 5 mm. No obstructing lesion is identified. No signs of acute pancreatic inflammation. This pancreatic ductal dilatation is of uncertain etiology and significance. 3. No other acute findings are noted elsewhere in the abdomen or pelvis. 4. 8 mm  lesion in segment 8 of the liver, incompletely characterized on today's examination, but favored to represent a tiny cavernous hemangioma. 5. Aortic atherosclerosis, in addition to at least left circumflex coronary artery disease. Please note that although the presence of coronary artery calcium documents the presence of coronary artery disease, the severity of this disease and any potential stenosis cannot be assessed on this non-gated CT examination. Assessment for potential risk factor modification, dietary therapy or pharmacologic therapy may be warranted, if clinically indicated. Electronically Signed   By: Vinnie Langton M.D.   On: 01/05/2017 16:50

## 2017-01-05 NOTE — ED Notes (Signed)
Pt and family verbalize understanding of d/c teaching and rx , pt in [place] to car. Husband is driving. Pt in NAD at this time, VS stable.

## 2017-01-06 ENCOUNTER — Emergency Department: Payer: Medicare Other

## 2017-01-06 ENCOUNTER — Inpatient Hospital Stay
Admission: EM | Admit: 2017-01-06 | Discharge: 2017-01-09 | DRG: 392 | Disposition: A | Discharge status: Home / Self Care | Payer: Medicare Other | Attending: Internal Medicine | Admitting: Internal Medicine

## 2017-01-06 DIAGNOSIS — Z818 Family history of other mental and behavioral disorders: Secondary | ICD-10-CM

## 2017-01-06 DIAGNOSIS — Z9884 Bariatric surgery status: Secondary | ICD-10-CM

## 2017-01-06 DIAGNOSIS — M170 Bilateral primary osteoarthritis of knee: Secondary | ICD-10-CM | POA: Diagnosis present

## 2017-01-06 DIAGNOSIS — M19041 Primary osteoarthritis, right hand: Secondary | ICD-10-CM | POA: Diagnosis present

## 2017-01-06 DIAGNOSIS — Z882 Allergy status to sulfonamides status: Secondary | ICD-10-CM

## 2017-01-06 DIAGNOSIS — Z951 Presence of aortocoronary bypass graft: Secondary | ICD-10-CM

## 2017-01-06 DIAGNOSIS — Z880 Allergy status to penicillin: Secondary | ICD-10-CM

## 2017-01-06 DIAGNOSIS — I2510 Atherosclerotic heart disease of native coronary artery without angina pectoris: Secondary | ICD-10-CM | POA: Diagnosis present

## 2017-01-06 DIAGNOSIS — H9190 Unspecified hearing loss, unspecified ear: Secondary | ICD-10-CM | POA: Diagnosis present

## 2017-01-06 DIAGNOSIS — Z91018 Allergy to other foods: Secondary | ICD-10-CM

## 2017-01-06 DIAGNOSIS — R5382 Chronic fatigue, unspecified: Secondary | ICD-10-CM | POA: Diagnosis present

## 2017-01-06 DIAGNOSIS — I471 Supraventricular tachycardia: Secondary | ICD-10-CM | POA: Diagnosis present

## 2017-01-06 DIAGNOSIS — F419 Anxiety disorder, unspecified: Secondary | ICD-10-CM | POA: Diagnosis present

## 2017-01-06 DIAGNOSIS — Z823 Family history of stroke: Secondary | ICD-10-CM

## 2017-01-06 DIAGNOSIS — M479 Spondylosis, unspecified: Secondary | ICD-10-CM | POA: Diagnosis present

## 2017-01-06 DIAGNOSIS — Z885 Allergy status to narcotic agent status: Secondary | ICD-10-CM

## 2017-01-06 DIAGNOSIS — K648 Other hemorrhoids: Secondary | ICD-10-CM | POA: Diagnosis present

## 2017-01-06 DIAGNOSIS — E876 Hypokalemia: Secondary | ICD-10-CM | POA: Diagnosis present

## 2017-01-06 DIAGNOSIS — M160 Bilateral primary osteoarthritis of hip: Secondary | ICD-10-CM | POA: Diagnosis present

## 2017-01-06 DIAGNOSIS — Z87442 Personal history of urinary calculi: Secondary | ICD-10-CM

## 2017-01-06 DIAGNOSIS — N182 Chronic kidney disease, stage 2 (mild): Secondary | ICD-10-CM | POA: Diagnosis present

## 2017-01-06 DIAGNOSIS — K6289 Other specified diseases of anus and rectum: Secondary | ICD-10-CM | POA: Diagnosis present

## 2017-01-06 DIAGNOSIS — R109 Unspecified abdominal pain: Secondary | ICD-10-CM

## 2017-01-06 DIAGNOSIS — K5909 Other constipation: Secondary | ICD-10-CM | POA: Diagnosis present

## 2017-01-06 DIAGNOSIS — K5900 Constipation, unspecified: Secondary | ICD-10-CM

## 2017-01-06 DIAGNOSIS — F329 Major depressive disorder, single episode, unspecified: Secondary | ICD-10-CM | POA: Diagnosis present

## 2017-01-06 DIAGNOSIS — Z7982 Long term (current) use of aspirin: Secondary | ICD-10-CM

## 2017-01-06 DIAGNOSIS — Z888 Allergy status to other drugs, medicaments and biological substances status: Secondary | ICD-10-CM

## 2017-01-06 DIAGNOSIS — Z8261 Family history of arthritis: Secondary | ICD-10-CM

## 2017-01-06 DIAGNOSIS — I129 Hypertensive chronic kidney disease with stage 1 through stage 4 chronic kidney disease, or unspecified chronic kidney disease: Secondary | ICD-10-CM | POA: Diagnosis present

## 2017-01-06 DIAGNOSIS — K5730 Diverticulosis of large intestine without perforation or abscess without bleeding: Principal | ICD-10-CM | POA: Diagnosis present

## 2017-01-06 DIAGNOSIS — K644 Residual hemorrhoidal skin tags: Secondary | ICD-10-CM | POA: Diagnosis present

## 2017-01-06 DIAGNOSIS — R1032 Left lower quadrant pain: Secondary | ICD-10-CM

## 2017-01-06 DIAGNOSIS — R935 Abnormal findings on diagnostic imaging of other abdominal regions, including retroperitoneum: Secondary | ICD-10-CM

## 2017-01-06 DIAGNOSIS — M069 Rheumatoid arthritis, unspecified: Secondary | ICD-10-CM | POA: Diagnosis present

## 2017-01-06 DIAGNOSIS — E785 Hyperlipidemia, unspecified: Secondary | ICD-10-CM | POA: Diagnosis present

## 2017-01-06 DIAGNOSIS — Z8262 Family history of osteoporosis: Secondary | ICD-10-CM

## 2017-01-06 DIAGNOSIS — M19042 Primary osteoarthritis, left hand: Secondary | ICD-10-CM | POA: Diagnosis present

## 2017-01-06 DIAGNOSIS — G4730 Sleep apnea, unspecified: Secondary | ICD-10-CM | POA: Diagnosis present

## 2017-01-06 DIAGNOSIS — Z8249 Family history of ischemic heart disease and other diseases of the circulatory system: Secondary | ICD-10-CM

## 2017-01-06 LAB — BASIC METABOLIC PANEL
Anion gap: 10 (ref 5–15)
BUN: 17 mg/dL (ref 6–20)
CALCIUM: 9.4 mg/dL (ref 8.9–10.3)
CO2: 26 mmol/L (ref 22–32)
CREATININE: 0.94 mg/dL (ref 0.44–1.00)
Chloride: 105 mmol/L (ref 101–111)
GFR calc non Af Amer: >60 mL/min (ref >60)
GLUCOSE: 169 mg/dL — AB (ref 65–99)
Potassium: 3.2 mmol/L — ABNORMAL LOW (ref 3.5–5.1)
Sodium: 141 mmol/L (ref 135–145)

## 2017-01-06 LAB — CBC WITH DIFFERENTIAL/PLATELET
Basophils Absolute: 0.1 10*3/uL (ref 0–0.1)
Basophils Relative: 1 %
EOS ABS: 0.1 10*3/uL (ref 0–0.7)
Eosinophils Relative: 1 %
HCT: 42.4 % (ref 35.0–47.0)
HEMOGLOBIN: 14.3 g/dL (ref 12.0–16.0)
LYMPHS ABS: 2.3 10*3/uL (ref 1.0–3.6)
Lymphocytes Relative: 18 %
MCH: 31.3 pg (ref 26.0–34.0)
MCHC: 33.8 g/dL (ref 32.0–36.0)
MCV: 92.6 fL (ref 80.0–100.0)
MONOS PCT: 11 %
Monocytes Absolute: 1.3 10*3/uL — ABNORMAL HIGH (ref 0.2–0.9)
NEUTROS PCT: 69 %
Neutro Abs: 8.6 10*3/uL — ABNORMAL HIGH (ref 1.4–6.5)
Platelets: 244 10*3/uL (ref 150–440)
RBC: 4.57 MIL/uL (ref 3.80–5.20)
RDW: 13 % (ref 11.5–14.5)
WBC: 12.4 10*3/uL — AB (ref 3.6–11.0)

## 2017-01-06 LAB — HEPATIC FUNCTION PANEL
ALBUMIN: 3.9 g/dL (ref 3.5–5.0)
ALT: 11 U/L — ABNORMAL LOW (ref 14–54)
AST: 22 U/L (ref 15–41)
Alkaline Phosphatase: 63 U/L (ref 38–126)
BILIRUBIN DIRECT: 0.1 mg/dL (ref 0.1–0.5)
BILIRUBIN INDIRECT: 0.7 mg/dL (ref 0.3–0.9)
BILIRUBIN TOTAL: 0.8 mg/dL (ref 0.3–1.2)
Total Protein: 7.1 g/dL (ref 6.5–8.1)

## 2017-01-06 LAB — LACTIC ACID, PLASMA: LACTIC ACID, VENOUS: 2.2 mmol/L — AB (ref 0.5–1.9)

## 2017-01-06 MED ORDER — MORPHINE SULFATE (PF) 4 MG/ML IV SOLN
4.0000 mg | Freq: Once | INTRAVENOUS | Status: AC
  Administered 2017-01-06: 4 mg via INTRAVENOUS
  Filled 2017-01-06: qty 1

## 2017-01-06 MED ORDER — MAGNESIUM CITRATE PO SOLN
ORAL | Status: AC
  Administered 2017-01-06: 1 via ORAL
  Filled 2017-01-06: qty 296

## 2017-01-06 MED ORDER — METRONIDAZOLE IN NACL 5-0.79 MG/ML-% IV SOLN
500.0000 mg | Freq: Once | INTRAVENOUS | Status: AC
  Administered 2017-01-06: 500 mg via INTRAVENOUS
  Filled 2017-01-06: qty 100

## 2017-01-06 MED ORDER — MORPHINE SULFATE (PF) 4 MG/ML IV SOLN
6.0000 mg | Freq: Once | INTRAVENOUS | Status: AC
  Administered 2017-01-06: 6 mg via INTRAVENOUS
  Filled 2017-01-06: qty 2

## 2017-01-06 MED ORDER — IOPAMIDOL (ISOVUE-300) INJECTION 61%
100.0000 mL | Freq: Once | INTRAVENOUS | Status: AC | PRN
  Administered 2017-01-06: 100 mL via INTRAVENOUS

## 2017-01-06 MED ORDER — ONDANSETRON HCL 4 MG/2ML IJ SOLN
4.0000 mg | Freq: Once | INTRAMUSCULAR | Status: AC
  Administered 2017-01-06: 4 mg via INTRAVENOUS
  Filled 2017-01-06: qty 2

## 2017-01-06 MED ORDER — SORBITOL 70 % SOLN
960.0000 mL | TOPICAL_OIL | Freq: Once | ORAL | Status: AC
  Administered 2017-01-06: 960 mL via RECTAL
  Filled 2017-01-06: qty 240

## 2017-01-06 MED ORDER — LEVOFLOXACIN IN D5W 750 MG/150ML IV SOLN
750.0000 mg | Freq: Once | INTRAVENOUS | Status: AC
  Administered 2017-01-07: 750 mg via INTRAVENOUS
  Filled 2017-01-06: qty 150

## 2017-01-06 MED ORDER — MAGNESIUM CITRATE PO SOLN
1.0000 | Freq: Once | ORAL | Status: AC
  Administered 2017-01-06: 1 via ORAL

## 2017-01-06 MED ORDER — SODIUM CHLORIDE 0.9 % IV BOLUS (SEPSIS)
1000.0000 mL | Freq: Once | INTRAVENOUS | Status: AC
  Administered 2017-01-06: 1000 mL via INTRAVENOUS

## 2017-01-06 NOTE — ED Notes (Signed)
Pharmacy contacted regarding sorbitol, milk of mag, spoke with [facility], states will send.

## 2017-01-06 NOTE — ED Triage Notes (Signed)
Pt presents to ED with c/o constipation and abdominal pain. Pt seen here yesterday for same and r/x'd Cipro, Flagyl, and Oxycodone. Pt reports no BM in the last 5 days. Pt is tearful and extremely uncomfortable. Charge RN notified and assigned ED Rm 8.

## 2017-01-06 NOTE — ED Provider Notes (Signed)
[facility] Emergency Department Provider Note  ____________________________________________   First MD Initiated Contact with Patient 01/06/17 2024     (approximate)  I have reviewed the triage vital signs and the nursing notes.   HISTORY  Chief Complaint Abdominal Pain and Constipation    HPI Lori Wagner is a 66 y.o. female who self presents the emergency department with severe lower abdominal pain and constipation. I'm very familiar with the patient is treated her yesterday and diagnosed her with proctocolitis via CT scan.  She said that after discharge she was compliant with her Augmentin and Flagyl and took 1-2 doses of Percocet, however her pain has slowly progressed.  She has been passing flatus but no stool.Her pain is been slowly progressive constant aching. Nothing seems to make it better or worse.   Past Medical History:  Diagnosis Date  . Anxiety   . Arthritis    knees,hip , back and hands  . Chronic kidney disease    Hx of kidney stones, years ago  . Coronary artery disease   . Dysrhythmia    SVT- controlled with Beta blocker  . Fatigue   . HOH (hard of hearing)    tinnitis both ears  . Hypertension     can cause lightheadedness  . Motion sickness   . Neuromuscular disorder ([place])    carpal tunnel both hands  . PONV (postoperative nausea and vomiting)   . RA (rheumatoid arthritis) ([place])   . Sleep apnea    Hx of/ when overweight, no CPAP  . Thyroid disease    HX OF/ NO LONGER TAKING THYROID MEDS, THYROID TESTS CAME BACK NORMAL  . Vertigo    Hx of    Patient Active Problem List   Diagnosis Date Noted  . Tremor 08/07/2016  . SVT (supraventricular tachycardia) ([place]) 06/25/2016  . Trigger finger of right thumb 01/18/2016  . Carpal tunnel syndrome 10/13/2015  . Bilateral carpal tunnel syndrome 10/13/2015  . Arthralgia of multiple joints 07/03/2015  . CFIDS (chronic fatigue and immune dysfunction syndrome) ([place])  07/03/2015  . Degeneration of intervertebral disc of lumbar region 07/03/2015  . Hand paresthesia 07/03/2015  . Major depressive disorder, recurrent, severe without psychotic features ([place]) 12/21/2014  . Panic disorder 12/21/2014  . Impingement syndrome of shoulder 07/22/2014  . Infraspinatus tenosynovitis 07/22/2014  . Impingement syndrome of left shoulder 07/22/2014  . Other synovitis and tenosynovitis, left shoulder 07/22/2014  . Left supraspinatus tenosynovitis 07/22/2014  . Acquired hypothyroidism 07/12/2014  . Pain in shoulder 07/12/2014  . Clinical depression 07/12/2014  . Combined fat and carbohydrate induced hyperlipemia 07/12/2014    Past Surgical History:  Procedure Laterality Date  . ANKLE SURGERY Left   . APPENDECTOMY    . BLADDER REPAIR    . CARDIAC CATHETERIZATION    . CARDIAC SURGERY    . CORONARY ARTERY BYPASS GRAFT  2001   x4  . GASTRIC BYPASS     SLEEVE  . HAND SURGERY Right    2 TRIGGER FINGERS  . KNEE ARTHROSCOPY Right   . renal colic    . TRIGGER FINGER RELEASE Right 01/17/2016   Procedure: RELEASE TRIGGER FINGER/A-1 PULLEY THUMB;  Surgeon: Corky Mull, MD;  Location: [place];  Service: Orthopedics;  Laterality: Right;    Prior to Admission medications   Medication Sig Start Date End Date Taking? Authorizing Provider  aspirin 81 MG tablet Take 162 mg by mouth daily. 2 tablets daily /am   Yes [provider]  atorvastatin (LIPITOR) 80 MG tablet Take 80 mg by mouth at bedtime.    Yes [provider]  bisacodyl (DULCOLAX) 5 MG EC tablet Take 2 tablets (10 mg total) by mouth daily as needed for moderate constipation. 01/05/17 01/05/18 Yes Darel Hong, MD  bisoprolol (ZEBETA) 5 MG tablet Take 2.5 mg by mouth daily.    Yes [provider]  buPROPion (WELLBUTRIN SR) 200 MG 12 hr tablet Take 1 tablet (200 mg total) by mouth every morning. 12/30/16 12/30/17 Yes Ravi, Himabindu, MD  calcium gluconate 500 MG tablet Take 1  tablet by mouth daily.   Yes [provider]  Cholecalciferol (VITAMIN D-1000 MAX ST) 1000 UNITS tablet Take 1,000 Units by mouth daily.    Yes [provider]  ciprofloxacin (CIPRO) 500 MG tablet Take 1 tablet (500 mg total) by mouth 2 (two) times daily. 01/05/17 01/15/17 Yes Darel Hong, MD  digoxin (LANOXIN) 0.125 MG tablet Take 0.125 mg by mouth daily.    Yes [provider]  DULoxetine (CYMBALTA) 60 MG capsule Take 1 capsule (60 mg total) by mouth daily. 12/30/16  Yes Ravi, Himabindu, MD  gabapentin (NEURONTIN) 100 MG capsule Take 200 mg by mouth 2 (two) times daily.   Yes [provider]  gabapentin (NEURONTIN) 300 MG capsule Take 300 mg by mouth at bedtime.    Yes [provider]  isosorbide mononitrate (IMDUR) 30 MG 24 hr tablet Take 30 mg by mouth daily. am   Yes [provider]  metroNIDAZOLE (FLAGYL) 500 MG tablet Take 1 tablet (500 mg total) by mouth 3 (three) times daily. 01/05/17 01/15/17 Yes Darel Hong, MD  Multiple Vitamin (MULTI-VITAMIN DAILY PO) Take 1 tablet by mouth daily.    Yes [provider]  nitroGLYCERIN (NITROSTAT) 0.4 MG SL tablet Place 0.4 mg under the tongue every 5 (five) minutes as needed for chest pain.   Yes [provider]  Omega-3 Fatty Acids (FISH OIL CONCENTRATE) 1000 MG CAPS Take 1,000 mg by mouth daily.    Yes [provider]  oxyCODONE-acetaminophen (ROXICET) 5-325 MG tablet Take 1 tablet by mouth every 6 (six) hours as needed. 01/05/17 01/05/18 Yes Darel Hong, MD    Allergies Epinephrine; Ace inhibitors; Morphine and related; Penicillins; Sulfa antibiotics; and Cynara scolymus ([place])  Family History  Problem Relation Age of Onset  . Hypertension Mother   . Coronary artery disease Mother   . Arthritis Mother   . Osteoporosis Mother   . Depression Mother   . Anxiety disorder Mother   . Coronary artery disease Father   . Stroke Father   . Coronary artery  disease Brother   . Bipolar disorder Brother   . Drug abuse Brother   . Coronary artery disease Maternal Aunt   . Osteoporosis Maternal Aunt   . Coronary artery disease Cousin   . Osteoporosis Cousin   . Anxiety disorder Daughter   . Depression Daughter     Social History Social History  Substance Use Topics  . Smoking status: Never Smoker  . Smokeless tobacco: Never Used  . Alcohol use No    Review of Systems Constitutional: No fever/chills Eyes: No visual changes. ENT: No sore throat. Cardiovascular: Denies chest pain. Respiratory: Denies shortness of breath. Gastrointestinal: Positive abdominal pain.  Positive nausea, no vomiting.  No diarrhea.  Positive constipation. Genitourinary: Negative for dysuria. Musculoskeletal: Negative for back pain. Skin: Negative for rash. Neurological: Negative for headaches, focal weakness or numbness.   ____________________________________________  PHYSICAL EXAM:  VITAL SIGNS: ED Triage Vitals  Enc Vitals Group     BP 01/06/17 2012 (!) 152/76     Pulse Rate 01/06/17 2012 78     Resp 01/06/17 2012 (!) 28     Temp 01/06/17 2012 98.8 F (37.1 C)     Temp Source 01/06/17 2012 Oral     SpO2 01/06/17 2012 100 %     Weight 01/06/17 2012 155 lb (70.3 kg)     Height 01/06/17 2012 5\' 5"  (1.651 m)     Head Circumference --      Peak Flow --      Pain Score 01/06/17 2016 10     Pain Loc --      Pain Edu? --      Excl. in [place]? --     Constitutional: Curled on her left side crying and moaning in pain Eyes: PERRL EOMI. Head: Atraumatic. Nose: No congestion/rhinnorhea. Mouth/Throat: No trismus Neck: No stridor.   Cardiovascular: Normal rate, regular rhythm. Grossly normal heart sounds.  Good peripheral circulation. Respiratory: Increased respiratory effort.  No retractions. Lungs CTAB and moving good air Gastrointestinal: Diffusely tender with rebound and guarding Musculoskeletal: No lower extremity edema   Neurologic:  Normal  speech and language. No gross focal neurologic deficits are appreciated. Skin:  Skin is warm, dry and intact. No rash noted. Psychiatric: Mood and affect are normal. Speech and behavior are normal.    ____________________________________________   DIFFERENTIAL includes but not limited to  Diverticulitis, bowel obstruction, proctocolitis, perforation ____________________________________________   LABS (all labs ordered are listed, but only abnormal results are displayed)  Labs Reviewed  BASIC METABOLIC PANEL - Abnormal; Notable for the following:       Result Value   Potassium 3.2 (*)    Glucose, Bld 169 (*)    All other components within normal limits  HEPATIC FUNCTION PANEL - Abnormal; Notable for the following:    ALT 11 (*)    All other components within normal limits  CBC WITH DIFFERENTIAL/PLATELET - Abnormal; Notable for the following:    WBC 12.4 (*)    Neutro Abs 8.6 (*)    Monocytes Absolute 1.3 (*)    All other components within normal limits  LACTIC ACID, PLASMA - Abnormal; Notable for the following:    Lactic Acid, Venous 2.2 (*)    All other components within normal limits  CULTURE, BLOOD (ROUTINE X 2)  CULTURE, BLOOD (ROUTINE X 2)  LACTIC ACID, PLASMA    Slightly elevated white count is nonspecific. Slightly elevated lactate also nonspecific and could either be secondary to dehydration versus infection __________________________________________  EKG   ____________________________________________  RADIOLOGY  CT scan pending ____________________________________________   PROCEDURES  Procedure(s) performed: no  Procedures  Critical Care performed: no  Observation: no ____________________________________________   INITIAL IMPRESSION / ASSESSMENT AND PLAN / ED COURSE  Pertinent labs & imaging results that were available during my care of the patient were reviewed by me and considered in my medical decision making (see chart for  details).  On arrival the patient is curled onto her left side moaning in pain and very difficult to evaluate. She does have abdominal tenderness but unclear if this is worse than yesterday or not. IV morphine labs and reevaluation.     ----------------------------------------- 11:00 PM on 01/06/2017 -----------------------------------------  After an enema the patient had a large bowel movement and she feels that the pressure in her abdomen is no longer there however she is  still in a significant amount of pain. On exam she has exquisite tenderness markedly worse than yesterday and I'm concerned that her infection may have spread. CT abdomen and pelvis is pending. ____________________________________________   FINAL CLINICAL IMPRESSION(S) / ED DIAGNOSES  Final diagnoses:  Constipation, unspecified constipation type  Abdominal pain, unspecified abdominal location      NEW MEDICATIONS STARTED DURING THIS VISIT:  New Prescriptions   No medications on file     Note:  This document was prepared using Dragon voice recognition software and may include unintentional dictation errors.     Darel Hong, MD 01/06/17 2306

## 2017-01-07 DIAGNOSIS — Z885 Allergy status to narcotic agent status: Secondary | ICD-10-CM | POA: Diagnosis not present

## 2017-01-07 DIAGNOSIS — K5900 Constipation, unspecified: Secondary | ICD-10-CM | POA: Diagnosis not present

## 2017-01-07 DIAGNOSIS — I471 Supraventricular tachycardia: Secondary | ICD-10-CM | POA: Diagnosis present

## 2017-01-07 DIAGNOSIS — R935 Abnormal findings on diagnostic imaging of other abdominal regions, including retroperitoneum: Secondary | ICD-10-CM | POA: Diagnosis not present

## 2017-01-07 DIAGNOSIS — Z9884 Bariatric surgery status: Secondary | ICD-10-CM | POA: Diagnosis not present

## 2017-01-07 DIAGNOSIS — R109 Unspecified abdominal pain: Secondary | ICD-10-CM | POA: Diagnosis present

## 2017-01-07 DIAGNOSIS — Z880 Allergy status to penicillin: Secondary | ICD-10-CM | POA: Diagnosis not present

## 2017-01-07 DIAGNOSIS — G4730 Sleep apnea, unspecified: Secondary | ICD-10-CM | POA: Diagnosis present

## 2017-01-07 DIAGNOSIS — R1032 Left lower quadrant pain: Secondary | ICD-10-CM

## 2017-01-07 DIAGNOSIS — Z882 Allergy status to sulfonamides status: Secondary | ICD-10-CM | POA: Diagnosis not present

## 2017-01-07 DIAGNOSIS — Z87442 Personal history of urinary calculi: Secondary | ICD-10-CM | POA: Diagnosis not present

## 2017-01-07 DIAGNOSIS — I129 Hypertensive chronic kidney disease with stage 1 through stage 4 chronic kidney disease, or unspecified chronic kidney disease: Secondary | ICD-10-CM | POA: Diagnosis present

## 2017-01-07 DIAGNOSIS — M170 Bilateral primary osteoarthritis of knee: Secondary | ICD-10-CM | POA: Diagnosis present

## 2017-01-07 DIAGNOSIS — Z8249 Family history of ischemic heart disease and other diseases of the circulatory system: Secondary | ICD-10-CM | POA: Diagnosis not present

## 2017-01-07 DIAGNOSIS — E785 Hyperlipidemia, unspecified: Secondary | ICD-10-CM | POA: Diagnosis present

## 2017-01-07 DIAGNOSIS — M19041 Primary osteoarthritis, right hand: Secondary | ICD-10-CM | POA: Diagnosis present

## 2017-01-07 DIAGNOSIS — K6289 Other specified diseases of anus and rectum: Secondary | ICD-10-CM | POA: Diagnosis present

## 2017-01-07 DIAGNOSIS — N182 Chronic kidney disease, stage 2 (mild): Secondary | ICD-10-CM | POA: Diagnosis present

## 2017-01-07 DIAGNOSIS — Z951 Presence of aortocoronary bypass graft: Secondary | ICD-10-CM | POA: Diagnosis not present

## 2017-01-07 DIAGNOSIS — M19042 Primary osteoarthritis, left hand: Secondary | ICD-10-CM | POA: Diagnosis present

## 2017-01-07 DIAGNOSIS — I2510 Atherosclerotic heart disease of native coronary artery without angina pectoris: Secondary | ICD-10-CM | POA: Diagnosis present

## 2017-01-07 DIAGNOSIS — Z7982 Long term (current) use of aspirin: Secondary | ICD-10-CM | POA: Diagnosis not present

## 2017-01-07 DIAGNOSIS — H9190 Unspecified hearing loss, unspecified ear: Secondary | ICD-10-CM | POA: Diagnosis present

## 2017-01-07 DIAGNOSIS — F329 Major depressive disorder, single episode, unspecified: Secondary | ICD-10-CM | POA: Diagnosis present

## 2017-01-07 DIAGNOSIS — M479 Spondylosis, unspecified: Secondary | ICD-10-CM | POA: Diagnosis present

## 2017-01-07 DIAGNOSIS — K5730 Diverticulosis of large intestine without perforation or abscess without bleeding: Secondary | ICD-10-CM | POA: Diagnosis present

## 2017-01-07 DIAGNOSIS — M160 Bilateral primary osteoarthritis of hip: Secondary | ICD-10-CM | POA: Diagnosis present

## 2017-01-07 DIAGNOSIS — M069 Rheumatoid arthritis, unspecified: Secondary | ICD-10-CM | POA: Diagnosis present

## 2017-01-07 DIAGNOSIS — R5382 Chronic fatigue, unspecified: Secondary | ICD-10-CM | POA: Diagnosis present

## 2017-01-07 LAB — GASTROINTESTINAL PANEL BY PCR, STOOL (REPLACES STOOL CULTURE)

## 2017-01-07 LAB — C DIFFICILE QUICK SCREEN W PCR REFLEX
C DIFFICILE (CDIFF) INTERP: NOT DETECTED
C DIFFICILE (CDIFF) TOXIN: NEGATIVE
C DIFFICLE (CDIFF) ANTIGEN: NEGATIVE

## 2017-01-07 LAB — MAGNESIUM: MAGNESIUM: 1.8 mg/dL (ref 1.7–2.4)

## 2017-01-07 LAB — TSH: TSH: 8.247 u[IU]/mL — ABNORMAL HIGH (ref 0.350–4.500)

## 2017-01-07 LAB — LACTIC ACID, PLASMA: Lactic Acid, Venous: 1.6 mmol/L (ref 0.5–1.9)

## 2017-01-07 MED ORDER — CALCIUM CARBONATE ANTACID 500 MG PO CHEW
1.0000 | CHEWABLE_TABLET | Freq: Every day | ORAL | Status: DC
  Administered 2017-01-07: 200 mg via ORAL
  Filled 2017-01-07: qty 1

## 2017-01-07 MED ORDER — POLYETHYLENE GLYCOL 3350 17 GM/SCOOP PO POWD
1.0000 | Freq: Once | ORAL | Status: AC
  Administered 2017-01-07: 255 g via ORAL
  Filled 2017-01-07: qty 255

## 2017-01-07 MED ORDER — BISACODYL 5 MG PO TBEC
10.0000 mg | DELAYED_RELEASE_TABLET | Freq: Every day | ORAL | Status: DC | PRN
  Administered 2017-01-08: 10 mg via ORAL
  Filled 2017-01-07: qty 2

## 2017-01-07 MED ORDER — ONDANSETRON HCL 4 MG PO TABS: 4.0000 mg | ORAL_TABLET | Freq: Four times a day (QID) | ORAL | Status: DC | PRN

## 2017-01-07 MED ORDER — NITROGLYCERIN 0.4 MG SL SUBL: 0.4000 mg | SUBLINGUAL_TABLET | SUBLINGUAL | Status: DC | PRN

## 2017-01-07 MED ORDER — SODIUM CHLORIDE 0.9 % IV SOLN
INTRAVENOUS | Status: DC
  Administered 2017-01-08: 1000 mL via INTRAVENOUS

## 2017-01-07 MED ORDER — OXYCODONE-ACETAMINOPHEN 5-325 MG PO TABS
1.0000 | ORAL_TABLET | Freq: Four times a day (QID) | ORAL | Status: DC | PRN
  Administered 2017-01-07 (×2): 1 via ORAL
  Filled 2017-01-07 (×2): qty 1

## 2017-01-07 MED ORDER — DOCUSATE SODIUM 100 MG PO CAPS: 100.0000 mg | ORAL_CAPSULE | Freq: Two times a day (BID) | ORAL | Status: DC

## 2017-01-07 MED ORDER — CIPROFLOXACIN HCL 500 MG PO TABS: 500.0000 mg | ORAL_TABLET | Freq: Two times a day (BID) | ORAL | Status: DC

## 2017-01-07 MED ORDER — MESALAMINE 1.2 G PO TBEC
1.2000 g | DELAYED_RELEASE_TABLET | Freq: Every day | ORAL | Status: DC
  Administered 2017-01-07: 1.2 g via ORAL
  Filled 2017-01-07: qty 1

## 2017-01-07 MED ORDER — GABAPENTIN 300 MG PO CAPS
300.0000 mg | ORAL_CAPSULE | Freq: Every day | ORAL | Status: DC
  Administered 2017-01-07 – 2017-01-08 (×2): 300 mg via ORAL
  Filled 2017-01-07 (×2): qty 1

## 2017-01-07 MED ORDER — POTASSIUM CHLORIDE IN NACL 40-0.9 MEQ/L-% IV SOLN
INTRAVENOUS | Status: DC
  Administered 2017-01-07 – 2017-01-08 (×3): 100 mL/h via INTRAVENOUS
  Filled 2017-01-07 (×5): qty 1000

## 2017-01-07 MED ORDER — ASPIRIN EC 81 MG PO TBEC: 162.0000 mg | DELAYED_RELEASE_TABLET | Freq: Every day | ORAL | Status: DC

## 2017-01-07 MED ORDER — GABAPENTIN 100 MG PO CAPS
200.0000 mg | ORAL_CAPSULE | Freq: Two times a day (BID) | ORAL | Status: DC
  Administered 2017-01-07: 200 mg via ORAL
  Filled 2017-01-07 (×3): qty 2

## 2017-01-07 MED ORDER — ASPIRIN EC 81 MG PO TBEC
162.0000 mg | DELAYED_RELEASE_TABLET | Freq: Every day | ORAL | Status: DC
  Administered 2017-01-07: 162 mg via ORAL
  Filled 2017-01-07: qty 2

## 2017-01-07 MED ORDER — ISOSORBIDE MONONITRATE ER 30 MG PO TB24
30.0000 mg | ORAL_TABLET | Freq: Every day | ORAL | Status: DC
  Administered 2017-01-07: 30 mg via ORAL
  Filled 2017-01-07: qty 1

## 2017-01-07 MED ORDER — CALCIUM GLUCONATE 500 MG PO TABS
1.0000 | ORAL_TABLET | Freq: Every day | ORAL | Status: DC
  Filled 2017-01-07 (×2): qty 1

## 2017-01-07 MED ORDER — VITAMIN D 1000 UNITS PO TABS
1000.0000 [IU] | ORAL_TABLET | Freq: Every day | ORAL | Status: DC
  Administered 2017-01-07: 1000 [IU] via ORAL
  Filled 2017-01-07: qty 1

## 2017-01-07 MED ORDER — ACETAMINOPHEN 650 MG RE SUPP: 650.0000 mg | Freq: Four times a day (QID) | RECTAL | Status: DC | PRN

## 2017-01-07 MED ORDER — BUPROPION HCL ER (SR) 100 MG PO TB12
200.0000 mg | ORAL_TABLET | Freq: Every morning | ORAL | Status: DC
  Administered 2017-01-07: 200 mg via ORAL
  Filled 2017-01-07 (×3): qty 2

## 2017-01-07 MED ORDER — METRONIDAZOLE 500 MG PO TABS
500.0000 mg | ORAL_TABLET | Freq: Three times a day (TID) | ORAL | Status: DC
  Filled 2017-01-07: qty 1

## 2017-01-07 MED ORDER — DIGOXIN 125 MCG PO TABS
0.1250 mg | ORAL_TABLET | Freq: Every day | ORAL | Status: DC
  Administered 2017-01-07 – 2017-01-08 (×2): 0.125 mg via ORAL
  Filled 2017-01-07 (×3): qty 1

## 2017-01-07 MED ORDER — ONDANSETRON HCL 4 MG/2ML IJ SOLN
4.0000 mg | Freq: Four times a day (QID) | INTRAMUSCULAR | Status: DC | PRN
  Administered 2017-01-07 (×2): 4 mg via INTRAVENOUS
  Filled 2017-01-07 (×2): qty 2

## 2017-01-07 MED ORDER — ATORVASTATIN CALCIUM 20 MG PO TABS
80.0000 mg | ORAL_TABLET | Freq: Every day | ORAL | Status: DC
  Administered 2017-01-07 – 2017-01-08 (×2): 80 mg via ORAL
  Filled 2017-01-07 (×2): qty 4

## 2017-01-07 MED ORDER — BISOPROLOL FUMARATE 5 MG PO TABS
2.5000 mg | ORAL_TABLET | Freq: Every day | ORAL | Status: DC
  Administered 2017-01-07 – 2017-01-08 (×2): 2.5 mg via ORAL
  Filled 2017-01-07 (×3): qty 0.5

## 2017-01-07 MED ORDER — METRONIDAZOLE IN NACL 5-0.79 MG/ML-% IV SOLN
500.0000 mg | Freq: Three times a day (TID) | INTRAVENOUS | Status: DC
  Administered 2017-01-07 – 2017-01-09 (×6): 500 mg via INTRAVENOUS
  Filled 2017-01-07 (×9): qty 100

## 2017-01-07 MED ORDER — CIPROFLOXACIN IN D5W 400 MG/200ML IV SOLN
400.0000 mg | Freq: Two times a day (BID) | INTRAVENOUS | Status: DC
  Administered 2017-01-07 – 2017-01-08 (×3): 400 mg via INTRAVENOUS
  Filled 2017-01-07 (×6): qty 200

## 2017-01-07 MED ORDER — ASPIRIN EC 81 MG PO TBEC: 81.0000 mg | DELAYED_RELEASE_TABLET | Freq: Every day | ORAL | Status: DC

## 2017-01-07 MED ORDER — DULOXETINE HCL 30 MG PO CPEP
60.0000 mg | ORAL_CAPSULE | Freq: Every day | ORAL | Status: DC
  Administered 2017-01-07: 60 mg via ORAL
  Filled 2017-01-07: qty 2

## 2017-01-07 MED ORDER — ACETAMINOPHEN 325 MG PO TABS: 650.0000 mg | ORAL_TABLET | Freq: Four times a day (QID) | ORAL | Status: DC | PRN

## 2017-01-07 MED ORDER — LEVOFLOXACIN IN D5W 750 MG/150ML IV SOLN
750.0000 mg | INTRAVENOUS | Status: DC
  Filled 2017-01-07: qty 150

## 2017-01-07 MED ORDER — HEPARIN SODIUM (PORCINE) 5000 UNIT/ML IJ SOLN
5000.0000 [IU] | Freq: Three times a day (TID) | INTRAMUSCULAR | Status: DC
  Administered 2017-01-07 – 2017-01-09 (×7): 5000 [IU] via SUBCUTANEOUS
  Filled 2017-01-07 (×7): qty 1

## 2017-01-07 NOTE — Progress Notes (Signed)
The patient has better abdominal pain, had a bowel movement. Vital signs are stable. Lab reviewed.  A/P This is a 66 year old female admitted for colitis. 1. Colitis: no history of inflammatory bowel disease but the patient does have rheumatoid arthritis which may predispose her to other autoimmune conditions. She received Levaquin in the emergency department.  continue Cipro and Flagyl. Hydrate with intravenous fluid. Dr. [name] started oral mesalamine. Per GI Dr. [name], Continue Cipro and Flagyl -Start clear liquid diet -Plan for colonoscopy tomorrow   2. Hypertension: acceptable; continue bisoprolol 3. CAD: stable; continue aspirin and Imdur. (The patient is also on digoxin due to history of SVT) 4. Hypokalemia: Repleted potassium 5. Hyperlipidemia: Continue statin therapy 6. Chronic pain: Continue Neurontin, Cymbalta and Wellbutrin.  Time spent 28 minutes.

## 2017-01-07 NOTE — Progress Notes (Signed)
Pharmacy Antibiotic Note  Lori Wagner is a 66 y.o. female admitted on 01/06/2017 with intra-abdominal infection.  Pharmacy has been consulted for levaquin/metronidazole dosing.  Plan: Patient received levaquin 750 mg IV x 1 and metronidazole 500 mg IV x 1 in the ED  Will continue levaquin 750 mg IV daily. Will continue metronidazole 500 mg IV q8h.   Height: 5\' 5"  (165.1 cm) Weight: 155 lb (70.3 kg) IBW/kg (Calculated) : 57  Temp (24hrs), Avg:98.7 F (37.1 C), Min:98.3 F (36.8 C), Max:99 F (37.2 C)   Recent Labs Lab 01/05/17 1524 01/05/17 1542 01/06/17 2050 01/06/17 2331  WBC 8.1  --  12.4*  --   CREATININE 0.83  --  0.94  --   LATICACIDVEN  --  1.7 2.2* 1.6    Estimated Creatinine Clearance: 59.5 mL/min (by C-G formula based on SCr of 0.94 mg/dL).    Allergies  Allergen Reactions  . Epinephrine Other (See Comments)    Other Reaction: Other reaction-may go into SVT Pt states she wants this taken out.  . Ace Inhibitors Cough  . Morphine And Related Nausea And Vomiting  . Penicillins Hives  . Sulfa Antibiotics Hives  . Cynara Scolymus ([place]) Hives    Thank you for allowing pharmacy to be a part of this patient's care.  Tobie Lords, PharmD, BCPS Clinical Pharmacist 01/07/2017

## 2017-01-07 NOTE — Consult Note (Signed)
Cephas Darby, MD [address]  [place]  [place], [place] [address]  Main: [phone]  Fax: [phone] Pager: [phone]   Consultation  Referring Provider:     No ref. provider found Primary Care Physician:  [name], MD Primary Gastroenterologist:  Dr. Sherri Sear         Reason for Consultation:    Left lower quadrant pain, constipation, abnormal CT  Date of Admission:  01/06/2017 Date of Consultation:  01/07/2017         HPI:   Lori Wagner is a 66 y.o. female with past medical history as detailed below who initially went to the ER 2 days ago because of severe constipation and abdominal pain. CT A/P at the time revealed thickening of the rectum and sigmoid. She was given Fleet enema and discharged to home on Cipro and Flagyl. But the pain persisted and got worse, she presented to the ER again yesterday and repeat CT revealed thickening of the rectosigmoid as well as descending colon. She recently returned from a cruise to [place] with her husband about a week ago. She denied having any issues while she was on cruise nor immediately after she returned. She generally has constipation, although has 1 bowel movement a day but stools are hard. She denies rectal bleeding. She had a colonoscopy 10 years ago for cancer screening and reports no polyps identified. Since admission, she is maintained on full liquid diet, started antibiotics Cipro and Flagyl, stool studies including C. difficile came back negative. She denies diarrhea, similar pain in the past, recent antibiotic use, NSAIDs use, weight loss, nausea, vomiting. She denies smoking and alcohol. She had a sleeve gastrectomy as a weight loss surgery.  Past Medical History:  Diagnosis Date  . Anxiety   . Arthritis    knees,hip , back and hands  . Chronic kidney disease    Hx of kidney stones, years ago  . Coronary artery disease   . Dysrhythmia    SVT- controlled with Beta blocker  . Fatigue   . HOH  (hard of hearing)    tinnitis both ears  . Hypertension     can cause lightheadedness  . Motion sickness   . Neuromuscular disorder ([place])    carpal tunnel both hands  . PONV (postoperative nausea and vomiting)   . RA (rheumatoid arthritis) ([place])   . Sleep apnea    Hx of/ when overweight, no CPAP  . Thyroid disease    HX OF/ NO LONGER TAKING THYROID MEDS, THYROID TESTS CAME BACK NORMAL  . Vertigo    Hx of    Past Surgical History:  Procedure Laterality Date  . ANKLE SURGERY Left   . APPENDECTOMY    . BLADDER REPAIR    . CARDIAC CATHETERIZATION    . CARDIAC SURGERY    . CORONARY ARTERY BYPASS GRAFT  2001   x4  . GASTRIC BYPASS     SLEEVE  . HAND SURGERY Right    2 TRIGGER FINGERS  . KNEE ARTHROSCOPY Right   . renal colic    . TRIGGER FINGER RELEASE Right 01/17/2016   Procedure: RELEASE TRIGGER FINGER/A-1 PULLEY THUMB;  Surgeon: Corky Mull, MD;  Location: [facility];  Service: Orthopedics;  Laterality: Right;    Prior to Admission medications   Medication Sig Start Date End Date Taking? Authorizing Provider  aspirin 81 MG tablet Take 162 mg by mouth daily. 2 tablets daily /am   Yes [provider]  atorvastatin (LIPITOR) 80 MG tablet Take 80 mg by mouth at bedtime.    Yes [provider]  bisacodyl (DULCOLAX) 5 MG EC tablet Take 2 tablets (10 mg total) by mouth daily as needed for moderate constipation. 01/05/17 01/05/18 Yes Darel Hong, MD  bisoprolol (ZEBETA) 5 MG tablet Take 2.5 mg by mouth daily.    Yes [provider]  buPROPion (WELLBUTRIN SR) 200 MG 12 hr tablet Take 1 tablet (200 mg total) by mouth every morning. 12/30/16 12/30/17 Yes Ravi, Himabindu, MD  calcium gluconate 500 MG tablet Take 1 tablet by mouth daily.   Yes [provider]  Cholecalciferol (VITAMIN D-1000 MAX ST) 1000 UNITS tablet Take 1,000 Units by mouth daily.    Yes [provider]  ciprofloxacin (CIPRO) 500 MG tablet Take 1 tablet (500 mg total)  by mouth 2 (two) times daily. 01/05/17 01/15/17 Yes Darel Hong, MD  digoxin (LANOXIN) 0.125 MG tablet Take 0.125 mg by mouth daily.    Yes [provider]  DULoxetine (CYMBALTA) 60 MG capsule Take 1 capsule (60 mg total) by mouth daily. 12/30/16  Yes Ravi, Himabindu, MD  gabapentin (NEURONTIN) 100 MG capsule Take 200 mg by mouth 2 (two) times daily.   Yes [provider]  gabapentin (NEURONTIN) 300 MG capsule Take 300 mg by mouth at bedtime.    Yes [provider]  isosorbide mononitrate (IMDUR) 30 MG 24 hr tablet Take 30 mg by mouth daily. am   Yes [provider]  metroNIDAZOLE (FLAGYL) 500 MG tablet Take 1 tablet (500 mg total) by mouth 3 (three) times daily. 01/05/17 01/15/17 Yes Darel Hong, MD  Multiple Vitamin (MULTI-VITAMIN DAILY PO) Take 1 tablet by mouth daily.    Yes [provider]  nitroGLYCERIN (NITROSTAT) 0.4 MG SL tablet Place 0.4 mg under the tongue every 5 (five) minutes as needed for chest pain.   Yes [provider]  Omega-3 Fatty Acids (FISH OIL CONCENTRATE) 1000 MG CAPS Take 1,000 mg by mouth daily.    Yes [provider]  oxyCODONE-acetaminophen (ROXICET) 5-325 MG tablet Take 1 tablet by mouth every 6 (six) hours as needed. 01/05/17 01/05/18 Yes Darel Hong, MD    Family History  Problem Relation Age of Onset  . Hypertension Mother   . Coronary artery disease Mother   . Arthritis Mother   . Osteoporosis Mother   . Depression Mother   . Anxiety disorder Mother   . Coronary artery disease Father   . Stroke Father   . Coronary artery disease Brother   . Bipolar disorder Brother   . Drug abuse Brother   . Coronary artery disease Maternal Aunt   . Osteoporosis Maternal Aunt   . Coronary artery disease Cousin   . Osteoporosis Cousin   . Anxiety disorder Daughter   . Depression Daughter      Social History  Substance Use Topics  . Smoking status: Never Smoker  . Smokeless tobacco: Never Used  .  Alcohol use No    Allergies as of 01/06/2017 - Review Complete 01/06/2017  Allergen Reaction Noted  . Epinephrine Other (See Comments) 12/05/2014  . Ace inhibitors Cough 11/27/2014  . Morphine and related Nausea And Vomiting 11/27/2014  . Penicillins Hives 11/27/2014  . Sulfa antibiotics Hives 11/27/2014  . Cynara scolymus ([place]) Hives 12/05/2014    Review of Systems:    All systems reviewed and negative except where noted in HPI.   Physical Exam:  Vital signs in last  24 hours: Temp:  [98.3 F (36.8 C)-99 F (37.2 C)] 98.3 F (36.8 C) (08/14 1001) Pulse Rate:  [63-90] 81 (08/14 1003) Resp:  [16-28] 20 (08/14 1001) BP: (110-152)/(49-76) 110/49 (08/14 1001) SpO2:  [95 %-100 %] 98 % (08/14 1001) Weight:  [70.3 kg (155 lb)-71.1 kg (156 lb 12.8 oz)] 71.1 kg (156 lb 12.8 oz) (08/14 0500) Last BM Date: 01/07/17 General:   Pleasant, cooperative in NAD Head:  Normocephalic and atraumatic. Eyes:   No icterus.   Conjunctiva pink. PERRLA. Ears:  Normal auditory acuity. Neck:  Supple; no masses or thyroidomegaly Lungs: Respirations even and unlabored. Lungs clear to auscultation bilaterally.   No wheezes, crackles, or rhonchi.  Heart:  Regular rate and rhythm;  Without murmur, clicks, rubs or gallops Abdomen:  Soft, nondistended, Mild to moderate tenderness in the left lower quadrant. Normal bowel sounds. No appreciable masses or hepatomegaly.  No rebound or guarding.  Rectal:  Not performed. Msk:  Symmetrical without gross deformities.  Extremities:  Without edema, cyanosis or clubbing. Neurologic:  Alert and oriented x3;  grossly normal neurologically. Skin:  Intact without significant lesions or rashes. Cervical Nodes:  No significant cervical adenopathy. Psych:  Alert and cooperative. Normal affect.  LAB RESULTS:  Recent Labs  01/05/17 1524 01/06/17 2050  WBC 8.1 12.4*  HGB 14.4 14.3  HCT 42.2 42.4  PLT 239 244   BMET  Recent Labs  01/05/17 1524 01/06/17 2050    NA 142 141  K 4.0 3.2*  CL 106 105  CO2 28 26  GLUCOSE 146* 169*  BUN 14 17  CREATININE 0.83 0.94  CALCIUM 9.5 9.4   LFT  Recent Labs  01/06/17 2050  PROT 7.1  ALBUMIN 3.9  AST 22  ALT 11*  ALKPHOS 63  BILITOT 0.8  BILIDIR 0.1  IBILI 0.7   PT/INR No results for input(s): LABPROT, INR in the last 72 hours.  STUDIES: Ct Abdomen Pelvis W Contrast  Result Date: 01/07/2017 CLINICAL DATA:  Acute onset of generalized abdominal pain and constipation. EXAM: CT ABDOMEN AND PELVIS WITH CONTRAST TECHNIQUE: Multidetector CT imaging of the abdomen and pelvis was performed using the standard protocol following bolus administration of intravenous contrast. CONTRAST:  17mL ISOVUE-300 IOPAMIDOL (ISOVUE-300) INJECTION 61% COMPARISON:  CT of the abdomen and pelvis performed 01/05/2017 FINDINGS: Lower chest: The patient is status post median sternotomy. Diffuse coronary artery calcifications are seen. Minimal bibasilar atelectasis is noted. Hepatobiliary: The liver is unremarkable in appearance. The gallbladder is mildly distended but otherwise unremarkable. The common bile duct remains normal in caliber. Pancreas: There is dilatation of the pancreatic duct to 5-6 mm, of uncertain significance. The pancreas is otherwise unremarkable. Spleen: The spleen is unremarkable in appearance. Adrenals/Urinary Tract: The adrenal glands are unremarkable in appearance. The kidneys are unremarkable in appearance. There is no evidence of hydronephrosis. No renal or ureteral stones are identified. No perinephric stranding is appreciated. Stomach/Bowel: There is wall thickening along the descending and sigmoid colon, concerning for an acute infectious or inflammatory colitis. Trace associated soft tissue inflammation and free fluid are seen. Fluid tracks about adjacent small bowel loops. The small bowel is otherwise unremarkable in appearance. The patient is status post please gastrectomy. The remaining stomach is  unremarkable in appearance. The patient is status post appendectomy. Vascular/Lymphatic: Scattered calcification is seen along the abdominal aorta and its branches. The abdominal aorta is otherwise grossly unremarkable. The inferior vena cava is grossly unremarkable. No retroperitoneal lymphadenopathy is seen. No pelvic sidewall lymphadenopathy is  identified. Reproductive: The bladder is mildly distended and grossly unremarkable. The uterus is grossly unremarkable in appearance. No suspicious adnexal masses are seen. Other: No additional soft tissue abnormalities are seen. Musculoskeletal: No acute osseous abnormalities are identified. Facet disease is noted at the lower lumbar spine. The visualized musculature is unremarkable in appearance. IMPRESSION: 1. Wall thickening along the descending and sigmoid colon, concerning for an acute infectious or inflammatory colitis. Trace associated soft tissue inflammation and free fluid noted. 2. Dilatation of the pancreatic duct to 5-6 mm, of uncertain significance. Would correlate with pancreatic lab values, and consider MRCP for further evaluation as deemed clinically appropriate. 3. Gallbladder mildly distended but otherwise unremarkable. 4. Diffuse coronary artery calcifications seen. 5. Scattered aortic atherosclerosis. Electronically Signed   By: Garald Balding M.D.   On: 01/07/2017 00:31   Ct Abdomen Pelvis W Contrast  Result Date: 01/05/2017 CLINICAL DATA:  66 year old female with history of abdominal pain and bloating for the past 3 days. No bowel movement and past 4 days. EXAM: CT ABDOMEN AND PELVIS WITH CONTRAST TECHNIQUE: Multidetector CT imaging of the abdomen and pelvis was performed using the standard protocol following bolus administration of intravenous contrast. CONTRAST:  175mL ISOVUE-300 IOPAMIDOL (ISOVUE-300) INJECTION 61% COMPARISON:  No priors. FINDINGS: Lower chest: Status post median sternotomy. Atherosclerotic calcifications in the left  circumflex coronary artery. Hepatobiliary: In the periphery of segment 8 of the liver there is an 8 mm lesion (axial image 4 of series 2) which appears to demonstrates some peripheral nodular enhancement, but is not visualized on delayed images, favored to represent a small cavernous hemangioma. No other larger more suspicious appearing cystic or solid hepatic lesions. No intra or extrahepatic biliary ductal dilatation. Gallbladder is normal in appearance. Pancreas: No pancreatic mass. Pancreatic duct is mildly dilated measuring up to 5 mm. No pancreatic or peripancreatic fluid or inflammatory changes. Spleen: Unremarkable. Adrenals/Urinary Tract: Bilateral kidneys and bilateral adrenal glands are normal in appearance. No hydroureteronephrosis. Urinary bladder is normal in appearance. Stomach/Bowel: Postoperative changes of sleeve gastrectomy are noted. No pathologic dilatation of small bowel or colon. The appendix is not confidently identified and may be surgically absent. Regardless, there are no inflammatory changes noted adjacent to the cecum to suggest the presence of an acute appendicitis at this time. Several irregular areas of mural thickening are noted in the distal sigmoid colon and proximal rectum, best appreciated on axial image 69 of series 2 and coronal image 62 of series 5. There is associated hypervascularity of the sigmoid mesocolon and mesorectum. No definite diverticular disease is noted in this region. Slight haziness in the surrounding mesocolic fat is noted, suggesting inflammation. Moderate volume of well-formed stool noted throughout the colon, particularly distally. Vascular/Lymphatic: Aortic atherosclerosis, without definite aneurysm or dissection in the abdominal or pelvic vasculature. No lymphadenopathy noted in the abdomen or pelvis. Reproductive: Uterus and ovaries are unremarkable in appearance. Other: No significant volume of ascites.  No pneumoperitoneum. Musculoskeletal: There are  no aggressive appearing lytic or blastic lesions noted in the visualized portions of the skeleton. Median sternotomy wires. IMPRESSION: 1. Profound thickening of the distal sigmoid colon and proximal rectum. There are subtle surrounding inflammatory changes in the adjacent mesocolic and mesorectal fat, as well as some hypervascularity, suggesting proctocolitis. However, the possibility of an underlying mass in this region is not excluded. Accordingly, follow-up nonemergent colonoscopy is recommended in the near future after resolution of the patient's acute symptoms to exclude the possibility of underlying neoplasm. 2. There is also mild dilatation  of the pancreatic duct which measures up to 5 mm. No obstructing lesion is identified. No signs of acute pancreatic inflammation. This pancreatic ductal dilatation is of uncertain etiology and significance. 3. No other acute findings are noted elsewhere in the abdomen or pelvis. 4. 8 mm lesion in segment 8 of the liver, incompletely characterized on today's examination, but favored to represent a tiny cavernous hemangioma. 5. Aortic atherosclerosis, in addition to at least left circumflex coronary artery disease. Please note that although the presence of coronary artery calcium documents the presence of coronary artery disease, the severity of this disease and any potential stenosis cannot be assessed on this non-gated CT examination. Assessment for potential risk factor modification, dietary therapy or pharmacologic therapy may be warranted, if clinically indicated. Electronically Signed   By: Vinnie Langton M.D.   On: 01/05/2017 16:50      Impression / Plan:   Lori Wagner is a 66 y.o. y/o female with 3 days' history of left lower quadrant pain and constipation. CT showing mild-to-moderate thickening of the left colon. This could be secondary to severe constipation or left-sided colitis secondary to UC or nonspecific finding or mild attack of  diverticulitis although not evident on the CT scan.  -Continue Cipro and Flagyl -Start clear liquid diet -Plan for colonoscopy tomorrow -We will administer MiraLAX prep as patient cannot tolerate GoLYTELY -Nothing by mouth past midnight  I have discussed risks & benefits, which include, but are not limited to, bleeding, infection, perforation,respiratory complication & drug reaction.  The patient agrees with this plan & written consent will be obtained.     Thank you for involving me in the care of this patient.      LOS: 0 days   Sherri Sear, MD  01/07/2017, 1:21 PM   Note: This dictation was prepared with Dragon dictation along with smaller phrase technology. Any transcriptional errors that result from this process are unintentional.

## 2017-01-07 NOTE — H&P (Signed)
Lori Wagner is an 66 y.o. female.   Chief Complaint: abdominal pain HPI: the patient with past medical history of rheumatoid arthritis, hypothyroidism, hypertension and coronary artery disease presents emergency department complaining of abdominal pain. She was seen 24 hours ago with the same. She was found to have proctitis and prescribed Colace and antibiotics. However today her pain worsened which prompted her to seek evaluation in the emergency department. She was given a laxative which alter have a large volume loose stool that was nonbloody. The patient admits to nausea but no vomiting. Repeat CT scan of the abdomen showed further extension of the colitis into the descending colon with significant mural thickening of the intestinal wall. Due to her failure of outpatient treatment and difficulty oral intake the emergency department staff called the hospitalist service for admission.  Past Medical History:  Diagnosis Date  . Anxiety   . Arthritis    knees,hip , back and hands  . Chronic kidney disease    Hx of kidney stones, years ago  . Coronary artery disease   . Dysrhythmia    SVT- controlled with Beta blocker  . Fatigue   . HOH (hard of hearing)    tinnitis both ears  . Hypertension     can cause lightheadedness  . Motion sickness   . Neuromuscular disorder ([place])    carpal tunnel both hands  . PONV (postoperative nausea and vomiting)   . RA (rheumatoid arthritis) ([place])   . Sleep apnea    Hx of/ when overweight, no CPAP  . Thyroid disease    HX OF/ NO LONGER TAKING THYROID MEDS, THYROID TESTS CAME BACK NORMAL  . Vertigo    Hx of    Past Surgical History:  Procedure Laterality Date  . ANKLE SURGERY Left   . APPENDECTOMY    . BLADDER REPAIR    . CARDIAC CATHETERIZATION    . CARDIAC SURGERY    . CORONARY ARTERY BYPASS GRAFT  2001   x4  . GASTRIC BYPASS     SLEEVE  . HAND SURGERY Right    2 TRIGGER FINGERS  . KNEE ARTHROSCOPY Right   . renal colic    .  TRIGGER FINGER RELEASE Right 01/17/2016   Procedure: RELEASE TRIGGER FINGER/A-1 PULLEY THUMB;  Surgeon: Corky Mull, MD;  Location: [facility];  Service: Orthopedics;  Laterality: Right;    Family History  Problem Relation Age of Onset  . Hypertension Mother   . Coronary artery disease Mother   . Arthritis Mother   . Osteoporosis Mother   . Depression Mother   . Anxiety disorder Mother   . Coronary artery disease Father   . Stroke Father   . Coronary artery disease Brother   . Bipolar disorder Brother   . Drug abuse Brother   . Coronary artery disease Maternal Aunt   . Osteoporosis Maternal Aunt   . Coronary artery disease Cousin   . Osteoporosis Cousin   . Anxiety disorder Daughter   . Depression Daughter    Social History:  reports that she has never smoked. She has never used smokeless tobacco. She reports that she does not drink alcohol or use drugs.  Allergies:  Allergies  Allergen Reactions  . Epinephrine Other (See Comments)    Other Reaction: Other reaction-may go into SVT Pt states she wants this taken out.  . Ace Inhibitors Cough  . Morphine And Related Nausea And Vomiting  . Penicillins Hives  . Sulfa Antibiotics Hives  .  Cynara Scolymus ([place]) Hives    Medications Prior to Admission  Medication Sig Dispense Refill  . aspirin 81 MG tablet Take 162 mg by mouth daily. 2 tablets daily /am    . atorvastatin (LIPITOR) 80 MG tablet Take 80 mg by mouth at bedtime.     . bisacodyl (DULCOLAX) 5 MG EC tablet Take 2 tablets (10 mg total) by mouth daily as needed for moderate constipation. 30 tablet 0  . bisoprolol (ZEBETA) 5 MG tablet Take 2.5 mg by mouth daily.     Marland Kitchen buPROPion (WELLBUTRIN SR) 200 MG 12 hr tablet Take 1 tablet (200 mg total) by mouth every morning. 90 tablet 0  . calcium gluconate 500 MG tablet Take 1 tablet by mouth daily.    . Cholecalciferol (VITAMIN D-1000 MAX ST) 1000 UNITS tablet Take 1,000 Units by mouth daily.     . ciprofloxacin  (CIPRO) 500 MG tablet Take 1 tablet (500 mg total) by mouth 2 (two) times daily. 20 tablet 0  . digoxin (LANOXIN) 0.125 MG tablet Take 0.125 mg by mouth daily.     . DULoxetine (CYMBALTA) 60 MG capsule Take 1 capsule (60 mg total) by mouth daily. 90 capsule 0  . gabapentin (NEURONTIN) 100 MG capsule Take 200 mg by mouth 2 (two) times daily.    Marland Kitchen gabapentin (NEURONTIN) 300 MG capsule Take 300 mg by mouth at bedtime.     . isosorbide mononitrate (IMDUR) 30 MG 24 hr tablet Take 30 mg by mouth daily. am    . metroNIDAZOLE (FLAGYL) 500 MG tablet Take 1 tablet (500 mg total) by mouth 3 (three) times daily. 30 tablet 0  . Multiple Vitamin (MULTI-VITAMIN DAILY PO) Take 1 tablet by mouth daily.     . nitroGLYCERIN (NITROSTAT) 0.4 MG SL tablet Place 0.4 mg under the tongue every 5 (five) minutes as needed for chest pain.    . Omega-3 Fatty Acids (FISH OIL CONCENTRATE) 1000 MG CAPS Take 1,000 mg by mouth daily.     Marland Kitchen oxyCODONE-acetaminophen (ROXICET) 5-325 MG tablet Take 1 tablet by mouth every 6 (six) hours as needed. 11 tablet 0    Results for orders placed or performed during the hospital encounter of 01/06/17 (from the past 48 hour(s))  Basic metabolic panel     Status: Abnormal   Collection Time: 01/06/17  8:50 PM  Result Value Ref Range   Sodium 141 135 - 145 mmol/L   Potassium 3.2 (L) 3.5 - 5.1 mmol/L   Chloride 105 101 - 111 mmol/L   CO2 26 22 - 32 mmol/L   Glucose, Bld 169 (H) 65 - 99 mg/dL   BUN 17 6 - 20 mg/dL   Creatinine, Ser 0.94 0.44 - 1.00 mg/dL   Calcium 9.4 8.9 - 10.3 mg/dL   GFR calc non Af Amer >60 >60 mL/min   GFR calc Af Amer >60 >60 mL/min    Comment: (NOTE) The eGFR has been calculated using the CKD EPI equation. This calculation has not been validated in all clinical situations. eGFR's persistently <60 mL/min signify possible Chronic Kidney Disease.    Anion gap 10 5 - 15  Hepatic function panel     Status: Abnormal   Collection Time: 01/06/17  8:50 PM  Result Value  Ref Range   Total Protein 7.1 6.5 - 8.1 g/dL   Albumin 3.9 3.5 - 5.0 g/dL   AST 22 15 - 41 U/L   ALT 11 (L) 14 - 54 U/L   Alkaline Phosphatase  63 38 - 126 U/L   Total Bilirubin 0.8 0.3 - 1.2 mg/dL   Bilirubin, Direct 0.1 0.1 - 0.5 mg/dL   Indirect Bilirubin 0.7 0.3 - 0.9 mg/dL  CBC with Differential     Status: Abnormal   Collection Time: 01/06/17  8:50 PM  Result Value Ref Range   WBC 12.4 (H) 3.6 - 11.0 K/uL   RBC 4.57 3.80 - 5.20 MIL/uL   Hemoglobin 14.3 12.0 - 16.0 g/dL   HCT 42.4 35.0 - 47.0 %   MCV 92.6 80.0 - 100.0 fL   MCH 31.3 26.0 - 34.0 pg   MCHC 33.8 32.0 - 36.0 g/dL   RDW 13.0 11.5 - 14.5 %   Platelets 244 150 - 440 K/uL   Neutrophils Relative % 69 %   Neutro Abs 8.6 (H) 1.4 - 6.5 K/uL   Lymphocytes Relative 18 %   Lymphs Abs 2.3 1.0 - 3.6 K/uL   Monocytes Relative 11 %   Monocytes Absolute 1.3 (H) 0.2 - 0.9 K/uL   Eosinophils Relative 1 %   Eosinophils Absolute 0.1 0 - 0.7 K/uL   Basophils Relative 1 %   Basophils Absolute 0.1 0 - 0.1 K/uL  Lactic acid, plasma     Status: Abnormal   Collection Time: 01/06/17  8:50 PM  Result Value Ref Range   Lactic Acid, Venous 2.2 (HH) 0.5 - 1.9 mmol/L    Comment: CRITICAL RESULT CALLED TO, READ BACK BY AND VERIFIED WITH KUCH YUAR ON 01/06/17 AT 2212 [facility]   TSH     Status: Abnormal   Collection Time: 01/06/17  8:50 PM  Result Value Ref Range   TSH 8.247 (H) 0.350 - 4.500 uIU/mL    Comment: Performed by a 3rd Generation assay with a functional sensitivity of <=0.01 uIU/mL.  Lactic acid, plasma     Status: None   Collection Time: 01/06/17 11:31 PM  Result Value Ref Range   Lactic Acid, Venous 1.6 0.5 - 1.9 mmol/L  Gastrointestinal Panel by PCR , Stool     Status: None   Collection Time: 01/07/17  3:43 AM  Result Value Ref Range   Campylobacter species NOT DETECTED NOT DETECTED   Plesimonas shigelloides NOT DETECTED NOT DETECTED   Salmonella species NOT DETECTED NOT DETECTED   Yersinia enterocolitica NOT DETECTED NOT  DETECTED   Vibrio species NOT DETECTED NOT DETECTED   Vibrio cholerae NOT DETECTED NOT DETECTED   Enteroaggregative E coli (EAEC) NOT DETECTED NOT DETECTED   Enteropathogenic E coli (EPEC) NOT DETECTED NOT DETECTED   Enterotoxigenic E coli (ETEC) NOT DETECTED NOT DETECTED   Shiga like toxin producing E coli (STEC) NOT DETECTED NOT DETECTED   Shigella/Enteroinvasive E coli (EIEC) NOT DETECTED NOT DETECTED   Cryptosporidium NOT DETECTED NOT DETECTED   Cyclospora cayetanensis NOT DETECTED NOT DETECTED   Entamoeba histolytica NOT DETECTED NOT DETECTED   Giardia lamblia NOT DETECTED NOT DETECTED   Adenovirus F40/41 NOT DETECTED NOT DETECTED   Astrovirus NOT DETECTED NOT DETECTED   Norovirus GI/GII NOT DETECTED NOT DETECTED   Rotavirus A NOT DETECTED NOT DETECTED   Sapovirus (I, II, IV, and V) NOT DETECTED NOT DETECTED  C difficile quick scan w PCR reflex     Status: None   Collection Time: 01/07/17  3:43 AM  Result Value Ref Range   C Diff antigen NEGATIVE NEGATIVE   C Diff toxin NEGATIVE NEGATIVE   C Diff interpretation No C. difficile detected.     Comment: VALID  Ct Abdomen Pelvis W Contrast  Result Date: 01/07/2017 CLINICAL DATA:  Acute onset of generalized abdominal pain and constipation. EXAM: CT ABDOMEN AND PELVIS WITH CONTRAST TECHNIQUE: Multidetector CT imaging of the abdomen and pelvis was performed using the standard protocol following bolus administration of intravenous contrast. CONTRAST:  166m ISOVUE-300 IOPAMIDOL (ISOVUE-300) INJECTION 61% COMPARISON:  CT of the abdomen and pelvis performed 01/05/2017 FINDINGS: Lower chest: The patient is status post median sternotomy. Diffuse coronary artery calcifications are seen. Minimal bibasilar atelectasis is noted. Hepatobiliary: The liver is unremarkable in appearance. The gallbladder is mildly distended but otherwise unremarkable. The common bile duct remains normal in caliber. Pancreas: There is dilatation of the pancreatic duct to  5-6 mm, of uncertain significance. The pancreas is otherwise unremarkable. Spleen: The spleen is unremarkable in appearance. Adrenals/Urinary Tract: The adrenal glands are unremarkable in appearance. The kidneys are unremarkable in appearance. There is no evidence of hydronephrosis. No renal or ureteral stones are identified. No perinephric stranding is appreciated. Stomach/Bowel: There is wall thickening along the descending and sigmoid colon, concerning for an acute infectious or inflammatory colitis. Trace associated soft tissue inflammation and free fluid are seen. Fluid tracks about adjacent small bowel loops. The small bowel is otherwise unremarkable in appearance. The patient is status post please gastrectomy. The remaining stomach is unremarkable in appearance. The patient is status post appendectomy. Vascular/Lymphatic: Scattered calcification is seen along the abdominal aorta and its branches. The abdominal aorta is otherwise grossly unremarkable. The inferior vena cava is grossly unremarkable. No retroperitoneal lymphadenopathy is seen. No pelvic sidewall lymphadenopathy is identified. Reproductive: The bladder is mildly distended and grossly unremarkable. The uterus is grossly unremarkable in appearance. No suspicious adnexal masses are seen. Other: No additional soft tissue abnormalities are seen. Musculoskeletal: No acute osseous abnormalities are identified. Facet disease is noted at the lower lumbar spine. The visualized musculature is unremarkable in appearance. IMPRESSION: 1. Wall thickening along the descending and sigmoid colon, concerning for an acute infectious or inflammatory colitis. Trace associated soft tissue inflammation and free fluid noted. 2. Dilatation of the pancreatic duct to 5-6 mm, of uncertain significance. Would correlate with pancreatic lab values, and consider MRCP for further evaluation as deemed clinically appropriate. 3. Gallbladder mildly distended but otherwise  unremarkable. 4. Diffuse coronary artery calcifications seen. 5. Scattered aortic atherosclerosis. Electronically Signed   By: JGarald BaldingM.D.   On: 01/07/2017 00:31   Ct Abdomen Pelvis W Contrast  Result Date: 01/05/2017 CLINICAL DATA:  66year old female with history of abdominal pain and bloating for the past 3 days. No bowel movement and past 4 days. EXAM: CT ABDOMEN AND PELVIS WITH CONTRAST TECHNIQUE: Multidetector CT imaging of the abdomen and pelvis was performed using the standard protocol following bolus administration of intravenous contrast. CONTRAST:  1058mISOVUE-300 IOPAMIDOL (ISOVUE-300) INJECTION 61% COMPARISON:  No priors. FINDINGS: Lower chest: Status post median sternotomy. Atherosclerotic calcifications in the left circumflex coronary artery. Hepatobiliary: In the periphery of segment 8 of the liver there is an 8 mm lesion (axial image 4 of series 2) which appears to demonstrates some peripheral nodular enhancement, but is not visualized on delayed images, favored to represent a small cavernous hemangioma. No other larger more suspicious appearing cystic or solid hepatic lesions. No intra or extrahepatic biliary ductal dilatation. Gallbladder is normal in appearance. Pancreas: No pancreatic mass. Pancreatic duct is mildly dilated measuring up to 5 mm. No pancreatic or peripancreatic fluid or inflammatory changes. Spleen: Unremarkable. Adrenals/Urinary Tract: Bilateral kidneys and bilateral adrenal glands  are normal in appearance. No hydroureteronephrosis. Urinary bladder is normal in appearance. Stomach/Bowel: Postoperative changes of sleeve gastrectomy are noted. No pathologic dilatation of small bowel or colon. The appendix is not confidently identified and may be surgically absent. Regardless, there are no inflammatory changes noted adjacent to the cecum to suggest the presence of an acute appendicitis at this time. Several irregular areas of mural thickening are noted in the distal  sigmoid colon and proximal rectum, best appreciated on axial image 69 of series 2 and coronal image 62 of series 5. There is associated hypervascularity of the sigmoid mesocolon and mesorectum. No definite diverticular disease is noted in this region. Slight haziness in the surrounding mesocolic fat is noted, suggesting inflammation. Moderate volume of well-formed stool noted throughout the colon, particularly distally. Vascular/Lymphatic: Aortic atherosclerosis, without definite aneurysm or dissection in the abdominal or pelvic vasculature. No lymphadenopathy noted in the abdomen or pelvis. Reproductive: Uterus and ovaries are unremarkable in appearance. Other: No significant volume of ascites.  No pneumoperitoneum. Musculoskeletal: There are no aggressive appearing lytic or blastic lesions noted in the visualized portions of the skeleton. Median sternotomy wires. IMPRESSION: 1. Profound thickening of the distal sigmoid colon and proximal rectum. There are subtle surrounding inflammatory changes in the adjacent mesocolic and mesorectal fat, as well as some hypervascularity, suggesting proctocolitis. However, the possibility of an underlying mass in this region is not excluded. Accordingly, follow-up nonemergent colonoscopy is recommended in the near future after resolution of the patient's acute symptoms to exclude the possibility of underlying neoplasm. 2. There is also mild dilatation of the pancreatic duct which measures up to 5 mm. No obstructing lesion is identified. No signs of acute pancreatic inflammation. This pancreatic ductal dilatation is of uncertain etiology and significance. 3. No other acute findings are noted elsewhere in the abdomen or pelvis. 4. 8 mm lesion in segment 8 of the liver, incompletely characterized on today's examination, but favored to represent a tiny cavernous hemangioma. 5. Aortic atherosclerosis, in addition to at least left circumflex coronary artery disease. Please note that  although the presence of coronary artery calcium documents the presence of coronary artery disease, the severity of this disease and any potential stenosis cannot be assessed on this non-gated CT examination. Assessment for potential risk factor modification, dietary therapy or pharmacologic therapy may be warranted, if clinically indicated. Electronically Signed   By: Vinnie Langton M.D.   On: 01/05/2017 16:50    Review of Systems  Constitutional: Negative for chills and fever.  HENT: Negative for sore throat and tinnitus.   Eyes: Negative for blurred vision and redness.  Respiratory: Negative for cough and shortness of breath.   Cardiovascular: Negative for chest pain, palpitations, orthopnea and PND.  Gastrointestinal: Positive for abdominal pain, constipation, diarrhea and nausea. Negative for vomiting.  Genitourinary: Negative for dysuria, frequency and urgency.  Musculoskeletal: Negative for joint pain and myalgias.  Skin: Negative for rash.       No lesions  Neurological: Negative for speech change, focal weakness and weakness.  Endo/Heme/Allergies: Does not bruise/bleed easily.       No temperature intolerance  Psychiatric/Behavioral: Negative for depression and suicidal ideas.    Blood pressure (!) 138/57, pulse 65, temperature 98.8 F (37.1 C), temperature source Oral, resp. rate 18, height 5' 5"  (1.651 m), weight 71.1 kg (156 lb 12.8 oz), SpO2 99 %. Physical Exam  Vitals reviewed. Constitutional: She is oriented to person, place, and time. She appears well-developed and well-nourished. No distress.  HENT:  Head: Normocephalic and atraumatic.  Mouth/Throat: Oropharynx is clear and moist.  Eyes: Pupils are equal, round, and reactive to light. Conjunctivae and EOM are normal.  Neck: Normal range of motion. Neck supple. No JVD present. No tracheal deviation present. No thyromegaly present.  Cardiovascular: Normal rate, regular rhythm and normal heart sounds.  Exam reveals no  gallop and no friction rub.   No murmur heard. Respiratory: Effort normal and breath sounds normal.  GI: Soft. Bowel sounds are normal. She exhibits no distension and no mass. There is tenderness. There is no rebound and no guarding.  Genitourinary:  Genitourinary Comments: Deferred  Musculoskeletal: Normal range of motion. She exhibits no edema.  Lymphadenopathy:    She has no cervical adenopathy.  Neurological: She is alert and oriented to person, place, and time. No cranial nerve deficit. She exhibits normal muscle tone.  Skin: Skin is warm and dry. No rash noted. No erythema.  Psychiatric: She has a normal mood and affect. Her behavior is normal. Judgment and thought content normal.     Assessment/Plan This is a 66 year old female admitted for colitis. 1. Colitis: no history of inflammatory bowel disease but the patient does have rheumatoid arthritis which may predispose her to other autoimmune conditions. She received Levaquin in the emergency department. We will continue Cipro and Flagyl. Hydrate with intravenous fluid. I also started the patient on oral mesalamine. Consult GI. She may benefit from mesalamine enema as well if she can tolerate. Pancreatic duct is also dilated without evidence of obstruction. Needs MRCP. 2. Hypertension: acceptable; continue bisoprolol 3. CAD: stable; continue aspirin and Imdur. (The patient is also on digoxin due to history of SVT) 4. Hypokalemia: Replete potassium 5. Hyperlipidemia: Continue statin therapy 6. Chronic pain: Continue Neurontin, Cymbalta and Wellbutrin.  7. DVT prophylaxis: heparin 8. GI prophylaxis: None The patient is a full code. Time spent on admission orders and patient care approximately 45 minutes.  [name], MD 01/07/2017, 6:45 AM

## 2017-01-07 NOTE — ED Notes (Signed)
Patient transported to room 211.

## 2017-01-07 NOTE — Progress Notes (Signed)
Notified Dr [name] of only drinking half bowel prep. She states she is unable to tolerate any more. Pain mediation given. Passing lots of flatus but no BM. Ok no need to drink any further prep, continue with enema in am a scheduled.

## 2017-01-07 NOTE — ED Notes (Signed)
Dr Diamond at bedside. 

## 2017-01-08 ENCOUNTER — Encounter
Admission: EM | Disposition: A | Discharge status: Home / Self Care | Payer: Self-pay | Source: Home / Self Care | Attending: Internal Medicine

## 2017-01-08 ENCOUNTER — Inpatient Hospital Stay: Payer: Medicare Other | Admitting: Anesthesiology

## 2017-01-08 ENCOUNTER — Encounter: Payer: Self-pay | Admitting: Certified Registered Nurse Anesthetist

## 2017-01-08 HISTORY — PX: COLONOSCOPY: SHX5424

## 2017-01-08 LAB — BASIC METABOLIC PANEL
ANION GAP: 4 — AB (ref 5–15)
BUN: 7 mg/dL (ref 6–20)
CALCIUM: 8.2 mg/dL — AB (ref 8.9–10.3)
CO2: 27 mmol/L (ref 22–32)
Chloride: 111 mmol/L (ref 101–111)
Creatinine, Ser: 0.59 mg/dL (ref 0.44–1.00)
GFR calc Af Amer: >60 mL/min (ref >60)
GFR calc non Af Amer: >60 mL/min (ref >60)
GLUCOSE: 85 mg/dL (ref 65–99)
Potassium: 4 mmol/L (ref 3.5–5.1)
Sodium: 142 mmol/L (ref 135–145)

## 2017-01-08 LAB — CBC
HCT: 34.5 % — ABNORMAL LOW (ref 35.0–47.0)
Hemoglobin: 11.7 g/dL — ABNORMAL LOW (ref 12.0–16.0)
MCH: 32.3 pg (ref 26.0–34.0)
MCHC: 33.9 g/dL (ref 32.0–36.0)
MCV: 95.4 fL (ref 80.0–100.0)
Platelets: 184 10*3/uL (ref 150–440)
RBC: 3.62 MIL/uL — ABNORMAL LOW (ref 3.80–5.20)
RDW: 13.1 % (ref 11.5–14.5)
WBC: 7.1 10*3/uL (ref 3.6–11.0)

## 2017-01-08 LAB — MISC LABCORP TEST (SEND OUT)

## 2017-01-08 LAB — HIV ANTIBODY (ROUTINE TESTING W REFLEX): HIV Screen 4th Generation wRfx: NONREACTIVE

## 2017-01-08 SURGERY — COLONOSCOPY
Anesthesia: General

## 2017-01-08 MED ORDER — PROPOFOL 500 MG/50ML IV EMUL
INTRAVENOUS | Status: AC
  Filled 2017-01-08: qty 50

## 2017-01-08 MED ORDER — LIDOCAINE HCL (CARDIAC) 20 MG/ML IV SOLN
INTRAVENOUS | Status: DC | PRN
  Administered 2017-01-08: 30 mg via INTRAVENOUS

## 2017-01-08 MED ORDER — PROPOFOL 10 MG/ML IV BOLUS
INTRAVENOUS | Status: DC | PRN
  Administered 2017-01-08 (×3): 20 mg via INTRAVENOUS
  Administered 2017-01-08: 40 mg via INTRAVENOUS

## 2017-01-08 MED ORDER — CIPROFLOXACIN HCL 500 MG PO TABS: 500.0000 mg | ORAL_TABLET | Freq: Two times a day (BID) | ORAL | 0 refills | Status: AC

## 2017-01-08 MED ORDER — METRONIDAZOLE 500 MG PO TABS: 500.0000 mg | ORAL_TABLET | Freq: Three times a day (TID) | ORAL | 0 refills | Status: AC

## 2017-01-08 MED ORDER — SODIUM CHLORIDE 0.9 % IV SOLN: INTRAVENOUS | Status: DC

## 2017-01-08 MED ORDER — POLYETHYLENE GLYCOL 3350 17 G PO PACK: 34.0000 g | PACK | Freq: Two times a day (BID) | ORAL | 2 refills | Status: DC

## 2017-01-08 MED ORDER — PROPOFOL 500 MG/50ML IV EMUL
INTRAVENOUS | Status: DC | PRN
  Administered 2017-01-08: 140 ug/kg/min via INTRAVENOUS

## 2017-01-08 MED ORDER — LIDOCAINE HCL (PF) 2 % IJ SOLN
INTRAMUSCULAR | Status: AC
  Filled 2017-01-08: qty 2

## 2017-01-08 MED ORDER — POLYETHYLENE GLYCOL 3350 17 G PO PACK
34.0000 g | PACK | Freq: Two times a day (BID) | ORAL | Status: DC
  Administered 2017-01-08: 34 g via ORAL
  Filled 2017-01-08 (×3): qty 2

## 2017-01-08 NOTE — Progress Notes (Signed)
Tap water given pnt tol. approx. 500-600 ml of warm tap water. Pnt released water. Clear and tan in color. Pnt resting in bed no other concerns at this time. Will continue to monitor.

## 2017-01-08 NOTE — Anesthesia Postprocedure Evaluation (Signed)
Anesthesia Post Note  Patient: Lori Wagner  Procedure(s) Performed: Procedure(s) (LRB): COLONOSCOPY (N/A)  Patient location during evaluation: Endoscopy Anesthesia Type: General Level of consciousness: awake and alert Pain management: pain level controlled Vital Signs Assessment: post-procedure vital signs reviewed and stable Respiratory status: spontaneous breathing and respiratory function stable Cardiovascular status: stable Anesthetic complications: no     Last Vitals:  Vitals:   01/08/17 1040 01/08/17 1047  BP: (!) 112/54 (!) 112/54  Pulse: 64 64  Resp: (!) 22 20  Temp: (!) 35.9 C (!) 35.9 C  SpO2: 96% 96%    Last Pain:  Vitals:   01/08/17 1047  TempSrc: Tympanic  PainSc:                  Rontavious Albright K

## 2017-01-08 NOTE — Consult Note (Signed)
Cephas Darby, MD [address]  [place]  [place], [place] [address]  Main: [phone]  Fax: [phone] Pager: [phone]   Lori Wagner is being followed for LLQ pain, chronic constipation, Day 1 of follow up    Subjective: Didn't tolerate entire miralax prep   Objective: Vital signs in last 24 hours: Vitals:   01/08/17 0425 01/08/17 0724 01/08/17 1040 01/08/17 1047  BP: (!) 104/53 (!) 119/56 (!) 112/54 (!) 112/54  Pulse: (!) 58 (!) 57 64 64  Resp: 18 18 (!) 22 20  Temp: (!) 97.4 F (36.3 C) (!) 96.6 F (35.9 C) (!) 96.7 F (35.9 C) (!) 96.7 F (35.9 C)  TempSrc: Oral Tympanic Tympanic Tympanic  SpO2: 95% 99% 96% 96%  Weight: 76.7 kg (169 lb 1.6 oz) 70.8 kg (156 lb)    Height:       Weight change: 6.396 kg (14 lb 1.6 oz)  Intake/Output Summary (Last 24 hours) at 01/08/17 1055 Last data filed at 01/08/17 1038  Gross per 24 hour  Intake             4799 ml  Output              200 ml  Net             4599 ml     Exam: Heart:: Regular rate and rhythm Lungs: clear to auscultation Abdomen: soft, mild LLQ tenderness, normal bowel sounds   Lab Results: @LABTEST2 @ Micro Results: Recent Results (from the past 240 hour(s))  Blood Culture (routine x 2)     Status: None (Preliminary result)   Collection Time: 01/06/17 11:30 PM  Result Value Ref Range Status   Specimen Description BLOOD RW  Final   Special Requests   Final    BOTTLES DRAWN AEROBIC AND ANAEROBIC Blood Culture adequate volume   Culture NO GROWTH 2 DAYS  Final   Report Status PENDING  Incomplete  Blood Culture (routine x 2)     Status: None (Preliminary result)   Collection Time: 01/06/17 11:31 PM  Result Value Ref Range Status   Specimen Description BLOOD RAC  Final   Special Requests   Final    BOTTLES DRAWN AEROBIC AND ANAEROBIC Blood Culture adequate volume   Culture NO GROWTH 2 DAYS  Final   Report Status PENDING  Incomplete  Gastrointestinal Panel by PCR , Stool      Status: None   Collection Time: 01/07/17  3:43 AM  Result Value Ref Range Status   Campylobacter species NOT DETECTED NOT DETECTED Final   Plesimonas shigelloides NOT DETECTED NOT DETECTED Final   Salmonella species NOT DETECTED NOT DETECTED Final   Yersinia enterocolitica NOT DETECTED NOT DETECTED Final   Vibrio species NOT DETECTED NOT DETECTED Final   Vibrio cholerae NOT DETECTED NOT DETECTED Final   Enteroaggregative E coli (EAEC) NOT DETECTED NOT DETECTED Final   Enteropathogenic E coli (EPEC) NOT DETECTED NOT DETECTED Final   Enterotoxigenic E coli (ETEC) NOT DETECTED NOT DETECTED Final   Shiga like toxin producing E coli (STEC) NOT DETECTED NOT DETECTED Final   Shigella/Enteroinvasive E coli (EIEC) NOT DETECTED NOT DETECTED Final   Cryptosporidium NOT DETECTED NOT DETECTED Final   Cyclospora cayetanensis NOT DETECTED NOT DETECTED Final   Entamoeba histolytica NOT DETECTED NOT DETECTED Final   Giardia lamblia NOT DETECTED NOT DETECTED Final   Adenovirus F40/41 NOT DETECTED NOT DETECTED Final   Astrovirus NOT DETECTED NOT DETECTED Final   Norovirus GI/GII  NOT DETECTED NOT DETECTED Final   Rotavirus A NOT DETECTED NOT DETECTED Final   Sapovirus (I, II, IV, and V) NOT DETECTED NOT DETECTED Final  C difficile quick scan w PCR reflex     Status: None   Collection Time: 01/07/17  3:43 AM  Result Value Ref Range Status   C Diff antigen NEGATIVE NEGATIVE Final   C Diff toxin NEGATIVE NEGATIVE Final   C Diff interpretation No C. difficile detected.  Final    Comment: VALID   Studies/Results: Ct Abdomen Pelvis W Contrast  Result Date: 01/07/2017 CLINICAL DATA:  Acute onset of generalized abdominal pain and constipation. EXAM: CT ABDOMEN AND PELVIS WITH CONTRAST TECHNIQUE: Multidetector CT imaging of the abdomen and pelvis was performed using the standard protocol following bolus administration of intravenous contrast. CONTRAST:  150mL ISOVUE-300 IOPAMIDOL (ISOVUE-300) INJECTION 61%  COMPARISON:  CT of the abdomen and pelvis performed 01/05/2017 FINDINGS: Lower chest: The patient is status post median sternotomy. Diffuse coronary artery calcifications are seen. Minimal bibasilar atelectasis is noted. Hepatobiliary: The liver is unremarkable in appearance. The gallbladder is mildly distended but otherwise unremarkable. The common bile duct remains normal in caliber. Pancreas: There is dilatation of the pancreatic duct to 5-6 mm, of uncertain significance. The pancreas is otherwise unremarkable. Spleen: The spleen is unremarkable in appearance. Adrenals/Urinary Tract: The adrenal glands are unremarkable in appearance. The kidneys are unremarkable in appearance. There is no evidence of hydronephrosis. No renal or ureteral stones are identified. No perinephric stranding is appreciated. Stomach/Bowel: There is wall thickening along the descending and sigmoid colon, concerning for an acute infectious or inflammatory colitis. Trace associated soft tissue inflammation and free fluid are seen. Fluid tracks about adjacent small bowel loops. The small bowel is otherwise unremarkable in appearance. The patient is status post please gastrectomy. The remaining stomach is unremarkable in appearance. The patient is status post appendectomy. Vascular/Lymphatic: Scattered calcification is seen along the abdominal aorta and its branches. The abdominal aorta is otherwise grossly unremarkable. The inferior vena cava is grossly unremarkable. No retroperitoneal lymphadenopathy is seen. No pelvic sidewall lymphadenopathy is identified. Reproductive: The bladder is mildly distended and grossly unremarkable. The uterus is grossly unremarkable in appearance. No suspicious adnexal masses are seen. Other: No additional soft tissue abnormalities are seen. Musculoskeletal: No acute osseous abnormalities are identified. Facet disease is noted at the lower lumbar spine. The visualized musculature is unremarkable in appearance.  IMPRESSION: 1. Wall thickening along the descending and sigmoid colon, concerning for an acute infectious or inflammatory colitis. Trace associated soft tissue inflammation and free fluid noted. 2. Dilatation of the pancreatic duct to 5-6 mm, of uncertain significance. Would correlate with pancreatic lab values, and consider MRCP for further evaluation as deemed clinically appropriate. 3. Gallbladder mildly distended but otherwise unremarkable. 4. Diffuse coronary artery calcifications seen. 5. Scattered aortic atherosclerosis. Electronically Signed   By: Garald Balding M.D.   On: 01/07/2017 00:31   Medications:  Scheduled: . [MAR Hold] aspirin EC  162 mg Oral Daily  . [MAR Hold] atorvastatin  80 mg Oral QHS  . [MAR Hold] bisoprolol  2.5 mg Oral Daily  . [MAR Hold] buPROPion  200 mg Oral q morning - 10a  . [MAR Hold] calcium carbonate  1 tablet Oral Daily  . [MAR Hold] cholecalciferol  1,000 Units Oral Daily  . [MAR Hold] digoxin  0.125 mg Oral Daily  . [MAR Hold] DULoxetine  60 mg Oral Daily  . [MAR Hold] gabapentin  200 mg Oral BID  . [  MAR Hold] gabapentin  300 mg Oral QHS  . [MAR Hold] heparin  5,000 Units Subcutaneous Q8H  . [MAR Hold] isosorbide mononitrate  30 mg Oral Daily   Scheduled Meds: . [MAR Hold] aspirin EC  162 mg Oral Daily  . [MAR Hold] atorvastatin  80 mg Oral QHS  . [MAR Hold] bisoprolol  2.5 mg Oral Daily  . [MAR Hold] buPROPion  200 mg Oral q morning - 10a  . [MAR Hold] calcium carbonate  1 tablet Oral Daily  . [MAR Hold] cholecalciferol  1,000 Units Oral Daily  . [MAR Hold] digoxin  0.125 mg Oral Daily  . [MAR Hold] DULoxetine  60 mg Oral Daily  . [MAR Hold] gabapentin  200 mg Oral BID  . [MAR Hold] gabapentin  300 mg Oral QHS  . [MAR Hold] heparin  5,000 Units Subcutaneous Q8H  . [MAR Hold] isosorbide mononitrate  30 mg Oral Daily   Continuous Infusions: . sodium chloride 1,000 mL (01/08/17 0733)  . sodium chloride    . 0.9 % NaCl with KCl 40 mEq / L 100 mL/hr  (01/08/17 0625)  . [MAR Hold] ciprofloxacin Stopped (01/07/17 2224)  . [MAR Hold] metronidazole Stopped (01/08/17 0155)   PRN Meds:.[MAR Hold] acetaminophen **OR** [MAR Hold] acetaminophen, [MAR Hold] bisacodyl, [MAR Hold] nitroGLYCERIN, [MAR Hold] ondansetron **OR** [MAR Hold] ondansetron (ZOFRAN) IV, [MAR Hold] oxyCODONE-acetaminophen   Assessment: Active Problems:   Proctitis   Constipation   LLQ abdominal pain   Abnormal CT of the abdomen  Colonoscopy - incomplete due to poor prep, solid stool in sigmoid colon  Plan: - Repeat colonoscopy at appointment to be scheduled because the examination was incomplete and because the bowel preparation was poor.   - Resume diet today.  - Continue present medications.  - Aggressive bowel regimen to manage chronic constipation - Started Miralax 34gm BID and continue as out pt - Follow up in GI clinic with me   LOS: 1 day   Kingjames Coury 01/08/2017, 10:55 AM

## 2017-01-08 NOTE — Anesthesia Preprocedure Evaluation (Signed)
Anesthesia Evaluation  Patient identified by MRN, date of birth, ID band Patient awake    Reviewed: Allergy & Precautions, NPO status , Patient's Chart, lab work & pertinent test results  History of Anesthesia Complications (+) PONV and history of anesthetic complications  Airway Mallampati: II       Dental   Pulmonary sleep apnea (improved after weight loss, cannoit toletrate CPAP) ,           Cardiovascular hypertension, Pt. on medications + CAD  + dysrhythmias Supra Ventricular Tachycardia      Neuro/Psych Anxiety Depression    GI/Hepatic neg GERD  ,  Endo/Other  Hypothyroidism   Renal/GU Renal InsufficiencyRenal disease     Musculoskeletal   Abdominal   Peds  Hematology   Anesthesia Other Findings   Reproductive/Obstetrics                            Anesthesia Physical Anesthesia Plan  ASA: III  Anesthesia Plan: General   Post-op Pain Management:    Induction:   PONV Risk Score and Plan:   Airway Management Planned:   Additional Equipment:   Intra-op Plan:   Post-operative Plan:   Informed Consent: I have reviewed the patients History and Physical, chart, labs and discussed the procedure including the risks, benefits and alternatives for the proposed anesthesia with the patient or authorized representative who has indicated his/her understanding and acceptance.     Plan Discussed with:   Anesthesia Plan Comments:         Anesthesia Quick Evaluation

## 2017-01-08 NOTE — Anesthesia Post-op Follow-up Note (Signed)
Anesthesia QCDR form completed.        

## 2017-01-08 NOTE — Anesthesia Procedure Notes (Signed)
Date/Time: 01/08/2017 10:16 AM Performed by: Johnna Acosta Pre-anesthesia Checklist: Patient identified, Emergency Drugs available, Suction available, Patient being monitored and Timeout performed Patient Re-evaluated:Patient Re-evaluated prior to induction Oxygen Delivery Method: Nasal cannula

## 2017-01-08 NOTE — Telephone Encounter (Signed)
ok 

## 2017-01-08 NOTE — Transfer of Care (Signed)
Immediate Anesthesia Transfer of Care Note  Patient: Lori Wagner  Procedure(s) Performed: Procedure(s): COLONOSCOPY (N/A)  Patient Location: PACU  Anesthesia Type:General  Level of Consciousness: sedated  Airway & Oxygen Therapy: Patient Spontanous Breathing and Patient connected to nasal cannula oxygen  Post-op Assessment: Report given to RN and Post -op Vital signs reviewed and stable  Post vital signs: Reviewed and stable  Last Vitals:  Vitals:   01/08/17 1040 01/08/17 1047  BP: (!) 112/54 (!) 112/54  Pulse: 64 64  Resp: (!) 22 20  Temp: (!) 35.9 C (!) 35.9 C  SpO2: 96% 96%    Last Pain:  Vitals:   01/08/17 1047  TempSrc: Tympanic  PainSc:          Complications: No apparent anesthesia complications

## 2017-01-08 NOTE — Op Note (Addendum)
[facility] Gastroenterology Patient Name: Lori Wagner Procedure Date: 01/08/2017 9:48 AM MRN: [id number] Account #: [id number] Date of Birth: September 04, 1950 Admit Type: Inpatient Age: 66 Room: [facility] ENDO ROOM 4 Gender: Female Note Status: Finalized Procedure:            Colonoscopy Indications:          Abdominal pain in the left lower quadrant, Abnormal CT                        of the GI tract, Constipation Providers:            [name] MD, MD Referring MD:         Alexander Bergeron, MD (Referring MD) Medicines:            Monitored Anesthesia Care Complications:        No immediate complications. Procedure:            Pre-Anesthesia Assessment:                       - Prior to the procedure, a History and Physical was                        performed, and patient medications and allergies were                        reviewed. The patient is competent. The risks and                        benefits of the procedure and the sedation options and                        risks were discussed with the patient. All questions                        were answered and informed consent was obtained.                        Patient identification and proposed procedure were                        verified by the physician, the nurse, the                        anesthesiologist, the anesthetist and the technician in                        the pre-procedure area in the procedure room. Mental                        Status Examination: alert and oriented. Airway                        Examination: normal oropharyngeal airway and neck                        mobility. Respiratory Examination: clear to                        auscultation. CV Examination: normal. Prophylactic  Antibiotics: The patient does not require prophylactic                        antibiotics. Prior Anticoagulants: The patient has                        taken no previous  anticoagulant or antiplatelet agents.                        ASA Grade Assessment: III - A patient with severe                        systemic disease. After reviewing the risks and                        benefits, the patient was deemed in satisfactory                        condition to undergo the procedure. The anesthesia plan                        was to use monitored anesthesia care (MAC). Immediately                        prior to administration of medications, the patient was                        re-assessed for adequacy to receive sedatives. The                        heart rate, respiratory rate, oxygen saturations, blood                        pressure, adequacy of pulmonary ventilation, and                        response to care were monitored throughout the                        procedure. The physical status of the patient was                        re-assessed after the procedure.                       After obtaining informed consent, the colonoscope was                        passed under direct vision. Throughout the procedure,                        the patient's blood pressure, pulse, and oxygen                        saturations were monitored continuously. The                        Colonoscope was introduced through the anus and                        advanced  to the the sigmoid colon. The colonoscopy was                        technically difficult and complex due to poor bowel                        prep with stool present and restricted mobility of the                        colon. The patient tolerated the procedure well. The                        quality of the bowel preparation was poor. Findings:      A few medium-mouthed diverticula were found in the sigmoid colon.      A large amount of solid stool was found in the sigmoid colon, precluding       visualization. Procedure aborted      Non-bleeding external and internal hemorrhoids were found during  digital       exam. The hemorrhoids were medium-sized. Impression:           - Preparation of the colon was poor.                       - Diverticulosis in the sigmoid colon.                       - Stool in the sigmoid colon.                       - Non-bleeding external and internal hemorrhoids.                       - No specimens collected. Recommendation:       - Repeat colonoscopy at appointment to be scheduled                        because the examination was incomplete and because the                        bowel preparation was poor.                       - Return patient to hospital ward for ongoing care.                       - Resume previous diet today.                       - Continue present medications.                       - Aggressive bowel regimen to manage chronic                        constipation                       - Started Miralax 34gm BID and continue as out pt                       - Follow up in GI clinic with  me Procedure Code(s):    --- Professional ---                       [phone], 53, Colonoscopy, flexible; diagnostic, including                        collection of specimen(s) by brushing or washing, when                        performed (separate procedure) Diagnosis Code(s):    --- Professional ---                       K64.8, Other hemorrhoids                       R10.32, Left lower quadrant pain                       K59.00, Constipation, unspecified                       K57.30, Diverticulosis of large intestine without                        perforation or abscess without bleeding                       R93.3, Abnormal findings on diagnostic imaging of other                        parts of digestive tract CPT copyright 2016 American Medical Association. All rights reserved. The codes documented in this report are preliminary and upon coder review may  be revised to meet current compliance requirements. Dr. Ulyess Mort [name] MD,  MD 01/08/2017 10:54:55 AM This report has been signed electronically. Number of Addenda: 0 Note Initiated On: 01/08/2017 9:48 AM Total Procedure Duration: 0 hours 27 minutes 40 seconds       [facility]

## 2017-01-08 NOTE — Progress Notes (Signed)
[place] [facility] [place] NAME: Lori Wagner    MR#:  [id number]  DATE OF BIRTH:  10/12/1950  SUBJECTIVE:  CHIEF COMPLAINT:   Chief Complaint  Patient presents with  . Abdominal Pain  . Constipation   Abdominal pain. Small bowel movement. REVIEW OF SYSTEMS:  Review of Systems  Constitutional: Negative for chills, fever and malaise/fatigue.  HENT: Negative for sore throat.   Eyes: Negative for blurred vision and double vision.  Respiratory: Negative for cough, hemoptysis, shortness of breath, wheezing and stridor.   Cardiovascular: Negative for chest pain, palpitations, orthopnea and leg swelling.  Gastrointestinal: Positive for abdominal pain and nausea. Negative for blood in stool, diarrhea, melena and vomiting.  Genitourinary: Negative for dysuria, flank pain and hematuria.  Musculoskeletal: Negative for back pain and joint pain.  Neurological: Negative for dizziness, sensory change, focal weakness, seizures, loss of consciousness, weakness and headaches.  Endo/Heme/Allergies: Negative for polydipsia.  Psychiatric/Behavioral: Negative for depression. The patient is not nervous/anxious.     DRUG ALLERGIES:   Allergies  Allergen Reactions  . Epinephrine Other (See Comments)    Other Reaction: Other reaction-may go into SVT Pt states she wants this taken out.  . Ace Inhibitors Cough  . Morphine And Related Nausea And Vomiting  . Penicillins Hives  . Sulfa Antibiotics Hives  . Cynara Scolymus ([place]) Hives   VITALS:  Blood pressure (!) 120/53, pulse (!) 51, temperature 98.1 F (36.7 C), temperature source Oral, resp. rate (!) 22, height 5\' 5"  (1.651 m), weight 156 lb (70.8 kg), SpO2 100 %. PHYSICAL EXAMINATION:  Physical Exam  Constitutional: She is oriented to person, place, and time and well-developed, well-nourished, and in no distress.  HENT:  Head: Normocephalic.  Mouth/Throat: Oropharynx is clear and moist.    Eyes: Pupils are equal, round, and reactive to light. Conjunctivae and EOM are normal. No scleral icterus.  Neck: Normal range of motion. Neck supple. No JVD present. No tracheal deviation present.  Cardiovascular: Normal rate, regular rhythm and normal heart sounds.  Exam reveals no gallop.   No murmur heard. Pulmonary/Chest: Effort normal and breath sounds normal. No respiratory distress. She has no wheezes. She has no rales.  Abdominal: Soft. Bowel sounds are normal. She exhibits no distension. There is tenderness. There is no rebound.  Musculoskeletal: Normal range of motion. She exhibits no edema or tenderness.  Neurological: She is alert and oriented to person, place, and time. No cranial nerve deficit.  Skin: No rash noted. No erythema.  Psychiatric: Affect normal.   LABORATORY PANEL:  Female CBC  Recent Labs Lab 01/08/17 0347  WBC 7.1  HGB 11.7*  HCT 34.5*  PLT 184   ------------------------------------------------------------------------------------------------------------------ Chemistries   Recent Labs Lab 01/06/17 2050 01/07/17 0503 01/08/17 0347  NA 141  --  142  K 3.2*  --  4.0  CL 105  --  111  CO2 26  --  27  GLUCOSE 169*  --  85  BUN 17  --  7  CREATININE 0.94  --  0.59  CALCIUM 9.4  --  8.2*  MG  --  1.8  --   AST 22  --   --   ALT 11*  --   --   ALKPHOS 63  --   --   BILITOT 0.8  --   --    RADIOLOGY:  No results found. ASSESSMENT AND PLAN:   This is a 66 year old female admitted for colitis.  1. Colitis: no history of inflammatory bowel disease but the patient does have rheumatoid arthritis which may predispose her to other autoimmune conditions. She received Levaquin in the emergency department.  continue Cipro and Flagyl. Hydrate with intravenous fluid. Dr. [name] started oral mesalamine. Per GI Dr. [name], Continue Cipro and Flagyl for 5 more days after discharge. Unable to get colonoscopy due to not enough preparation. Start MiraLAX 34 g  twice a day for one month and 2 refills, colonoscopy as outpatient per Dr. [name].  2. Hypertension: acceptable; continue bisoprolol 3. CAD: stable; continue aspirin and Imdur. (The patient is also on digoxin due to history of SVT) 4. Hypokalemia: Improved with potassium 5. Hyperlipidemia: Continue statin therapy 6. Chronic pain: Continue Neurontin, Cymbalta and Wellbutrin.  All the records are reviewed and case discussed with Care Management/Social Worker. Management plans discussed with the patient, her husband and they are in agreement.  CODE STATUS: Full Code  TOTAL TIME TAKING CARE OF THIS PATIENT: 32 minutes.   More than 50% of the time was spent in counseling/coordination of care: YES  POSSIBLE D/C IN 1 DAYS, DEPENDING ON CLINICAL CONDITION.   Demetrios Loll M.D on 01/08/2017 at 1:48 PM  Between 7am to 6pm - Pager - [phone]  After 6pm go to [email or web address] - Proofreader  [facility]  Office  [phone]  CC: Primary care physician; [name], MD  Note: This dictation was prepared with Dragon dictation along with smaller phrase technology. Any transcriptional errors that result from this process are unintentional.

## 2017-01-08 NOTE — H&P (Signed)
Lori Darby, MD [address]  [place]  [place], [place] [address]  Main: [phone]  Fax: [phone] Pager: [phone]  Primary Care Physician:  Lori Pink, MD Primary Gastroenterologist:  Dr. Cephas Wagner  Pre-Procedure History & Physical: HPI:  Lori Wagner is a 66 y.o. female is here for an colonoscopy.   Past Medical History:  Diagnosis Date  . Anxiety   . Arthritis    knees,hip , back and hands  . Chronic kidney disease    Hx of kidney stones, years ago  . Coronary artery disease   . Dysrhythmia    SVT- controlled with Beta blocker  . Fatigue   . HOH (hard of hearing)    tinnitis both ears  . Hypertension     can cause lightheadedness  . Motion sickness   . Neuromuscular disorder ([place])    carpal tunnel both hands  . PONV (postoperative nausea and vomiting)   . RA (rheumatoid arthritis) ([place])   . Sleep apnea    Hx of/ when overweight, no CPAP  . Thyroid disease    HX OF/ NO LONGER TAKING THYROID MEDS, THYROID TESTS CAME BACK NORMAL  . Vertigo    Hx of    Past Surgical History:  Procedure Laterality Date  . ANKLE SURGERY Left   . APPENDECTOMY    . BLADDER REPAIR    . CARDIAC CATHETERIZATION    . CARDIAC SURGERY    . CORONARY ARTERY BYPASS GRAFT  2001   x4  . GASTRIC BYPASS     SLEEVE  . HAND SURGERY Right    2 TRIGGER FINGERS  . KNEE ARTHROSCOPY Right   . renal colic    . TRIGGER FINGER RELEASE Right 01/17/2016   Procedure: RELEASE TRIGGER FINGER/A-1 PULLEY THUMB;  Surgeon: Corky Mull, MD;  Location: [facility];  Service: Orthopedics;  Laterality: Right;    Prior to Admission medications   Medication Sig Start Date End Date Taking? Authorizing Provider  aspirin 81 MG tablet Take 162 mg by mouth daily. 2 tablets daily /am   Yes [provider]  atorvastatin (LIPITOR) 80 MG tablet Take 80 mg by mouth at bedtime.    Yes [provider]  bisacodyl (DULCOLAX) 5 MG EC tablet Take 2 tablets (10 mg  total) by mouth daily as needed for moderate constipation. 01/05/17 01/05/18 Yes Darel Hong, MD  bisoprolol (ZEBETA) 5 MG tablet Take 2.5 mg by mouth daily.    Yes [provider]  buPROPion (WELLBUTRIN SR) 200 MG 12 hr tablet Take 1 tablet (200 mg total) by mouth every morning. 12/30/16 12/30/17 Yes Ravi, Himabindu, MD  calcium gluconate 500 MG tablet Take 1 tablet by mouth daily.   Yes [provider]  Cholecalciferol (VITAMIN D-1000 MAX ST) 1000 UNITS tablet Take 1,000 Units by mouth daily.    Yes [provider]  ciprofloxacin (CIPRO) 500 MG tablet Take 1 tablet (500 mg total) by mouth 2 (two) times daily. 01/05/17 01/15/17 Yes Darel Hong, MD  digoxin (LANOXIN) 0.125 MG tablet Take 0.125 mg by mouth daily.    Yes [provider]  DULoxetine (CYMBALTA) 60 MG capsule Take 1 capsule (60 mg total) by mouth daily. 12/30/16  Yes Ravi, Himabindu, MD  gabapentin (NEURONTIN) 100 MG capsule Take 200 mg by mouth 2 (two) times daily.   Yes [provider]  gabapentin (NEURONTIN) 300 MG capsule Take 300 mg by mouth at bedtime.    Yes [provider]  isosorbide mononitrate (IMDUR) 30 MG 24 hr tablet Take 30 mg by mouth daily. am   Yes [provider]  metroNIDAZOLE (FLAGYL) 500 MG tablet Take 1 tablet (500 mg total) by mouth 3 (three) times daily. 01/05/17 01/15/17 Yes Darel Hong, MD  Multiple Vitamin (MULTI-VITAMIN DAILY PO) Take 1 tablet by mouth daily.    Yes [provider]  nitroGLYCERIN (NITROSTAT) 0.4 MG SL tablet Place 0.4 mg under the tongue every 5 (five) minutes as needed for chest pain.   Yes [provider]  Omega-3 Fatty Acids (FISH OIL CONCENTRATE) 1000 MG CAPS Take 1,000 mg by mouth daily.    Yes [provider]  oxyCODONE-acetaminophen (ROXICET) 5-325 MG tablet Take 1 tablet by mouth every 6 (six) hours as needed. 01/05/17 01/05/18 Yes Darel Hong, MD    Allergies as of 01/06/2017 - Review  Complete 01/06/2017  Allergen Reaction Noted  . Epinephrine Other (See Comments) 12/05/2014  . Ace inhibitors Cough 11/27/2014  . Morphine and related Nausea And Vomiting 11/27/2014  . Penicillins Hives 11/27/2014  . Sulfa antibiotics Hives 11/27/2014  . Cynara scolymus ([place]) Hives 12/05/2014    Family History  Problem Relation Age of Onset  . Hypertension Mother   . Coronary artery disease Mother   . Arthritis Mother   . Osteoporosis Mother   . Depression Mother   . Anxiety disorder Mother   . Coronary artery disease Father   . Stroke Father   . Coronary artery disease Brother   . Bipolar disorder Brother   . Drug abuse Brother   . Coronary artery disease Maternal Aunt   . Osteoporosis Maternal Aunt   . Coronary artery disease Cousin   . Osteoporosis Cousin   . Anxiety disorder Daughter   . Depression Daughter     Social History   Social History  . Marital status: Married    Spouse name: N/A  . Number of children: N/A  . Years of education: N/A   Occupational History  . Not on file.   Social History Main Topics  . Smoking status: Never Smoker  . Smokeless tobacco: Never Used  . Alcohol use No  . Drug use: No  . Sexual activity: No   Other Topics Concern  . Not on file   Social History Narrative  . No narrative on file    Review of Systems: See HPI, otherwise negative ROS  Physical Exam: BP (!) 119/56   Pulse (!) 57   Temp (!) 96.6 F (35.9 C) (Tympanic)   Resp 18   Ht 5\' 5"  (1.651 m)   Wt 70.8 kg (156 lb)   SpO2 99%   BMI 25.96 kg/m  General:   Alert,  pleasant and cooperative in NAD Head:  Normocephalic and atraumatic. Neck:  Supple; no masses or thyromegaly. Lungs:  Clear throughout to auscultation.    Heart:  Regular rate and rhythm. Abdomen:  Soft, nontender and nondistended. Normal bowel sounds, without guarding, and without rebound.   Neurologic:  Alert and  oriented x4;  grossly normal neurologically.  Impression/Plan: [name] is here for an colonoscopy to be performed for LLQ pain, abnormal CT  Risks, benefits, limitations, and alternatives regarding  colonoscopy have been reviewed with the patient.  Questions have been answered.  All parties agreeable.   Sherri Sear, MD  01/08/2017, 10:42 AM

## 2017-01-09 ENCOUNTER — Encounter: Payer: Self-pay | Admitting: Gastroenterology

## 2017-01-09 MED ORDER — ONDANSETRON HCL 4 MG PO TABS: 4.0000 mg | ORAL_TABLET | Freq: Four times a day (QID) | ORAL | 0 refills | Status: DC | PRN

## 2017-01-09 MED ORDER — POLYETHYLENE GLYCOL 3350 17 G PO PACK
34.0000 g | PACK | Freq: Two times a day (BID) | ORAL | 0 refills | Status: DC | PRN
Start: 2017-01-09 — End: 2017-01-09

## 2017-01-09 MED ORDER — POLYETHYLENE GLYCOL 3350 17 G PO PACK: 17.0000 g | PACK | Freq: Two times a day (BID) | ORAL | 0 refills | Status: AC

## 2017-01-09 NOTE — Progress Notes (Signed)
Pharmacy Antibiotic Note  Lori Wagner is a 66 y.o. female admitted on 01/06/2017 with intra-abdominal infection.  Pharmacy has been consulted for metronidazole   Plan: Will continue Metronidazole 500 mg IV q8 hours.    Height: 5\' 5"  (165.1 cm) Weight: 158 lb (71.7 kg) IBW/kg (Calculated) : 57  Temp (24hrs), Avg:97.7 F (36.5 C), Min:96.7 F (35.9 C), Max:98.7 F (37.1 C)   Recent Labs Lab 01/05/17 1524 01/05/17 1542 01/06/17 2050 01/06/17 2331 01/08/17 0347  WBC 8.1  --  12.4*  --  7.1  CREATININE 0.83  --  0.94  --  0.59  LATICACIDVEN  --  1.7 2.2* 1.6  --     Estimated Creatinine Clearance: 70.5 mL/min (by C-G formula based on SCr of 0.59 mg/dL).    Allergies  Allergen Reactions  . Epinephrine Other (See Comments)    Other Reaction: Other reaction-may go into SVT Pt states she wants this taken out.  . Ace Inhibitors Cough  . Morphine And Related Nausea And Vomiting  . Penicillins Hives  . Sulfa Antibiotics Hives  . Cynara Scolymus ([place]) Hives    Thank you for allowing pharmacy to be a part of this patient's care.  Larene Beach, PharmD  Clinical Pharmacist 01/09/2017

## 2017-01-09 NOTE — Discharge Summary (Addendum)
[facility] NAME: Lori Wagner    MR#:  [id number]  DATE OF BIRTH:  09-03-1950  DATE OF ADMISSION:  01/06/2017   ADMITTING PHYSICIAN: [name], MD  DATE OF DISCHARGE:  01/09/2017  PRIMARY CARE PHYSICIAN: [name], MD   ADMISSION DIAGNOSIS:  Abdominal pain, unspecified abdominal location [R10.9] Constipation, unspecified constipation type [K59.00] DISCHARGE DIAGNOSIS:  Active Problems:   Proctitis   Constipation   LLQ abdominal pain   Abnormal CT of the abdomen  SECONDARY DIAGNOSIS:   Past Medical History:  Diagnosis Date  . Anxiety   . Arthritis    knees,hip , back and hands  . Chronic kidney disease    Hx of kidney stones, years ago  . Coronary artery disease   . Dysrhythmia    SVT- controlled with Beta blocker  . Fatigue   . HOH (hard of hearing)    tinnitis both ears  . Hypertension     can cause lightheadedness  . Motion sickness   . Neuromuscular disorder ([place])    carpal tunnel both hands  . PONV (postoperative nausea and vomiting)   . RA (rheumatoid arthritis) ([place])   . Sleep apnea    Hx of/ when overweight, no CPAP  . Thyroid disease    HX OF/ NO LONGER TAKING THYROID MEDS, THYROID TESTS CAME BACK NORMAL  . Vertigo    Hx of   HOSPITAL COURSE:   This is a 66 year old female admitted for colitis. 1. Colitis: no history of inflammatory bowel disease but the patient does have rheumatoid arthritis which may predispose her to other autoimmune conditions. She received Levaquin in the emergency department.  continue Cipro and Flagyl. Hydrate with intravenous fluid. Dr. [name] started oral mesalamine. Per GI Dr. [name], Continue Cipro and Flagyl for 5 more days after discharge. Unable to get colonoscopy due to not enough preparation. Start MiraLAX 17 g twice a day, cutting down to once a daily if she has diarrhea., Follow-up for colonoscopy as outpatient in 2 weeks per Dr.  [name].  2. Hypertension: acceptable; continue bisoprolol 3. CAD: stable; continue aspirin and Imdur. (The patient is also on digoxin due to history of SVT) 4. Hypokalemia: Improved with potassium 5. Hyperlipidemia: Continue statin therapy 6. Chronic pain: Continue Neurontin, Cymbalta and Wellbutrin. I discussed with Dr. [name]. DISCHARGE CONDITIONS:  Stable, discharge to home today. CONSULTS OBTAINED:  Treatment Team:  [name], MD DRUG ALLERGIES:   Allergies  Allergen Reactions  . Epinephrine Other (See Comments)    Other Reaction: Other reaction-may go into SVT Pt states she wants this taken out.  . Ace Inhibitors Cough  . Morphine And Related Nausea And Vomiting  . Penicillins Hives  . Sulfa Antibiotics Hives  . Cynara Scolymus ([place]) Hives   DISCHARGE MEDICATIONS:   Allergies as of 01/09/2017      Reactions   Epinephrine Other (See Comments)   Other Reaction: Other reaction-may go into SVT Pt states she wants this taken out.   Ace Inhibitors Cough   Morphine And Related Nausea And Vomiting   Penicillins Hives   Sulfa Antibiotics Hives   Cynara Scolymus ([place]) Hives      Medication List    TAKE these medications   aspirin 81 MG tablet Take 162 mg by mouth daily. 2 tablets daily /am   atorvastatin 80 MG tablet Commonly known as:  LIPITOR Take 80 mg by mouth at bedtime.   bisacodyl 5  MG EC tablet Commonly known as:  DULCOLAX Take 2 tablets (10 mg total) by mouth daily as needed for moderate constipation.   bisoprolol 5 MG tablet Commonly known as:  ZEBETA Take 2.5 mg by mouth daily.   buPROPion 200 MG 12 hr tablet Commonly known as:  WELLBUTRIN SR Take 1 tablet (200 mg total) by mouth every morning.   calcium gluconate 500 MG tablet Take 1 tablet by mouth daily.   ciprofloxacin 500 MG tablet Commonly known as:  CIPRO Take 1 tablet (500 mg total) by mouth 2 (two) times daily.   digoxin 0.125 MG tablet Commonly known as:   LANOXIN Take 0.125 mg by mouth daily.   DULoxetine 60 MG capsule Commonly known as:  CYMBALTA Take 1 capsule (60 mg total) by mouth daily.   FISH OIL CONCENTRATE 1000 MG Caps Take 1,000 mg by mouth daily.   gabapentin 300 MG capsule Commonly known as:  NEURONTIN Take 300 mg by mouth at bedtime.   gabapentin 100 MG capsule Commonly known as:  NEURONTIN Take 200 mg by mouth 2 (two) times daily.   isosorbide mononitrate 30 MG 24 hr tablet Commonly known as:  IMDUR Take 30 mg by mouth daily. am   metroNIDAZOLE 500 MG tablet Commonly known as:  FLAGYL Take 1 tablet (500 mg total) by mouth 3 (three) times daily.   MULTI-VITAMIN DAILY PO Take 1 tablet by mouth daily.   nitroGLYCERIN 0.4 MG SL tablet Commonly known as:  NITROSTAT Place 0.4 mg under the tongue every 5 (five) minutes as needed for chest pain.   ondansetron 4 MG tablet Commonly known as:  ZOFRAN Take 1 tablet (4 mg total) by mouth every 6 (six) hours as needed for nausea.   oxyCODONE-acetaminophen 5-325 MG tablet Commonly known as:  ROXICET Take 1 tablet by mouth every 6 (six) hours as needed.   polyethylene glycol packet Commonly known as:  MIRALAX / GLYCOLAX Take 17 g by mouth 2 (two) times daily.   VITAMIN D-1000 MAX ST 1000 units tablet Generic drug:  Cholecalciferol Take 1,000 Units by mouth daily.        DISCHARGE INSTRUCTIONS:  See AVS.  If you experience worsening of your admission symptoms, develop shortness of breath, life threatening emergency, suicidal or homicidal thoughts you must seek medical attention immediately by calling 911 or calling your MD immediately  if symptoms less severe.  You Must read complete instructions/literature along with all the possible adverse reactions/side effects for all the Medicines you take and that have been prescribed to you. Take any new Medicines after you have completely understood and accpet all the possible adverse reactions/side effects.   Please  note  You were cared for by a hospitalist during your hospital stay. If you have any questions about your discharge medications or the care you received while you were in the hospital after you are discharged, you can call the unit and asked to speak with the hospitalist on call if the hospitalist that took care of you is not available. Once you are discharged, your primary care physician will handle any further medical issues. Please note that NO REFILLS for any discharge medications will be authorized once you are discharged, as it is imperative that you return to your primary care physician (or establish a relationship with a primary care physician if you do not have one) for your aftercare needs so that they can reassess your need for medications and monitor your lab values.    On  the day of Discharge:  VITAL SIGNS:  Blood pressure (!) 124/57, pulse (!) 59, temperature 97.7 F (36.5 C), temperature source Oral, resp. rate 18, height 5\' 5"  (1.651 m), weight 158 lb (71.7 kg), SpO2 99 %. PHYSICAL EXAMINATION:  GENERAL:  66 y.o.-year-old patient lying in the bed with no acute distress.  EYES: Pupils equal, round, reactive to light and accommodation. No scleral icterus. Extraocular muscles intact.  HEENT: Head atraumatic, normocephalic. Oropharynx and nasopharynx clear.  NECK:  Supple, no jugular venous distention. No thyroid enlargement, no tenderness.  LUNGS: Normal breath sounds bilaterally, no wheezing, rales,rhonchi or crepitation. No use of accessory muscles of respiration.  CARDIOVASCULAR: S1, S2 normal. No murmurs, rubs, or gallops.  ABDOMEN: Soft, non-tender, non-distended. Bowel sounds present. No organomegaly or mass.  EXTREMITIES: No pedal edema, cyanosis, or clubbing.  NEUROLOGIC: Cranial nerves II through XII are intact. Muscle strength 5/5 in all extremities. Sensation intact. Gait not checked.  PSYCHIATRIC: The patient is alert and oriented x 3.  SKIN: No obvious rash, lesion, or  ulcer.  DATA REVIEW:   CBC  Recent Labs Lab 01/08/17 0347  WBC 7.1  HGB 11.7*  HCT 34.5*  PLT 184    Chemistries   Recent Labs Lab 01/06/17 2050 01/07/17 0503 01/08/17 0347  NA 141  --  142  K 3.2*  --  4.0  CL 105  --  111  CO2 26  --  27  GLUCOSE 169*  --  85  BUN 17  --  7  CREATININE 0.94  --  0.59  CALCIUM 9.4  --  8.2*  MG  --  1.8  --   AST 22  --   --   ALT 11*  --   --   ALKPHOS 63  --   --   BILITOT 0.8  --   --      Microbiology Results  Results for orders placed or performed during the hospital encounter of 01/06/17  Blood Culture (routine x 2)     Status: None (Preliminary result)   Collection Time: 01/06/17 11:30 PM  Result Value Ref Range Status   Specimen Description BLOOD RW  Final   Special Requests   Final    BOTTLES DRAWN AEROBIC AND ANAEROBIC Blood Culture adequate volume   Culture NO GROWTH 3 DAYS  Final   Report Status PENDING  Incomplete  Blood Culture (routine x 2)     Status: None (Preliminary result)   Collection Time: 01/06/17 11:31 PM  Result Value Ref Range Status   Specimen Description BLOOD RAC  Final   Special Requests   Final    BOTTLES DRAWN AEROBIC AND ANAEROBIC Blood Culture adequate volume   Culture NO GROWTH 3 DAYS  Final   Report Status PENDING  Incomplete  Gastrointestinal Panel by PCR , Stool     Status: None   Collection Time: 01/07/17  3:43 AM  Result Value Ref Range Status   Campylobacter species NOT DETECTED NOT DETECTED Final   Plesimonas shigelloides NOT DETECTED NOT DETECTED Final   Salmonella species NOT DETECTED NOT DETECTED Final   Yersinia enterocolitica NOT DETECTED NOT DETECTED Final   Vibrio species NOT DETECTED NOT DETECTED Final   Vibrio cholerae NOT DETECTED NOT DETECTED Final   Enteroaggregative E coli (EAEC) NOT DETECTED NOT DETECTED Final   Enteropathogenic E coli (EPEC) NOT DETECTED NOT DETECTED Final   Enterotoxigenic E coli (ETEC) NOT DETECTED NOT DETECTED Final   Shiga like toxin  producing  E coli (STEC) NOT DETECTED NOT DETECTED Final   Shigella/Enteroinvasive E coli (EIEC) NOT DETECTED NOT DETECTED Final   Cryptosporidium NOT DETECTED NOT DETECTED Final   Cyclospora cayetanensis NOT DETECTED NOT DETECTED Final   Entamoeba histolytica NOT DETECTED NOT DETECTED Final   Giardia lamblia NOT DETECTED NOT DETECTED Final   Adenovirus F40/41 NOT DETECTED NOT DETECTED Final   Astrovirus NOT DETECTED NOT DETECTED Final   Norovirus GI/GII NOT DETECTED NOT DETECTED Final   Rotavirus A NOT DETECTED NOT DETECTED Final   Sapovirus (I, II, IV, and V) NOT DETECTED NOT DETECTED Final  C difficile quick scan w PCR reflex     Status: None   Collection Time: 01/07/17  3:43 AM  Result Value Ref Range Status   C Diff antigen NEGATIVE NEGATIVE Final   C Diff toxin NEGATIVE NEGATIVE Final   C Diff interpretation No C. difficile detected.  Final    Comment: VALID    RADIOLOGY:  No results found.   Management plans discussed with the patient, family and they are in agreement.  CODE STATUS: Full Code   TOTAL TIME TAKING CARE OF THIS PATIENT: 33 minutes.    Demetrios Loll M.D on 01/09/2017 at 9:43 AM  Between 7am to 6pm - Pager - [phone]  After 6pm go to [email or web address] - Proofreader  [facility]  Office  [phone]  CC: Primary care physician; [name], MD   Note: This dictation was prepared with Dragon dictation along with smaller phrase technology. Any transcriptional errors that result from this process are unintentional.

## 2017-01-09 NOTE — Progress Notes (Signed)
MD ordered patient to be discharged home.  Discharge instructions were reviewed with the patient and husband and they voiced understanding.  Patient informed about her follow-up appointments.  Prescriptions given to the patient.  IV was removed with catheter intact.  All patients questions were answered.  Patient left via wheelchair escorted by auxillary.

## 2017-01-11 LAB — CULTURE, BLOOD (ROUTINE X 2)
CULTURE: NO GROWTH
CULTURE: NO GROWTH
Special Requests: ADEQUATE
Special Requests: ADEQUATE

## 2017-02-24 ENCOUNTER — Telehealth: Payer: Self-pay

## 2017-02-24 DIAGNOSIS — B029 Zoster without complications: Secondary | ICD-10-CM

## 2017-02-24 HISTORY — DX: Zoster without complications: B02.9

## 2017-02-24 NOTE — Telephone Encounter (Signed)
received a fax requesting a refill on the wellbutrin sr 200mg  pt was last seen on  8-6+-18 next appt  04-01-17. last rx was sent to [facility].  [facility]   buPROPion [facility] SR) 200 MG 12 hr tablet  Medication  Date: 12/30/2016 Department: [facility] Ordering/Authorizing: Elvin So, MD  Order Providers   Prescribing Provider Encounter Provider  Elvin So, MD Elvin So, MD  Medication Detail    Disp Refills Start End   buPROPion [facility] SR) 200 MG 12 hr tablet 90 tablet 0 12/30/2016 12/30/2017   Sig - Route: Take 1 tablet (200 mg total) by mouth every morning. - Oral   Class: [place]

## 2017-02-25 ENCOUNTER — Other Ambulatory Visit (HOSPITAL_COMMUNITY): Payer: Self-pay | Admitting: Psychiatry

## 2017-02-25 NOTE — Telephone Encounter (Signed)
A 90 day supply was [facility] on her last appointment

## 2017-02-26 NOTE — Telephone Encounter (Signed)
recevied another fax in reqards to wellbutrin.  according to the rx it states it was printed.   do you remember if you gave script to pt?

## 2017-02-27 NOTE — Telephone Encounter (Signed)
FAXED AND CONFIRMED FORM BACK TO PHARMACY ASKING THEM TO CONTACT PATIENT THAT RX WAS GIVEN TO PATIENT.

## 2017-02-27 NOTE — Telephone Encounter (Signed)
Yes I gave the prescription to the patient.

## 2017-02-27 NOTE — Telephone Encounter (Signed)
ok 

## 2017-04-01 ENCOUNTER — Ambulatory Visit: Payer: Medicare Other | Admitting: Psychiatry

## 2017-04-02 ENCOUNTER — Ambulatory Visit: Payer: Medicare Other | Admitting: Psychiatry

## 2017-04-09 ENCOUNTER — Telehealth: Payer: Self-pay

## 2017-04-09 NOTE — Telephone Encounter (Signed)
pt called left a message that she is out of the duloxetine pt has appt 04-15-17 but needs enough to get to appt.   DULoxetine (CYMBALTA) 60 MG capsule  Medication  Date: 12/30/2016 Department: [facility] Ordering/Authorizing: Elvin So, MD  Order Providers   Prescribing Provider Encounter Provider  Elvin So, MD Elvin So, MD  Medication Detail    Disp Refills Start End   DULoxetine (CYMBALTA) 60 MG capsule 90 capsule 0 12/30/2016    Sig - Route: Take 1 capsule (60 mg total) by mouth daily. - Oral   Class: Print

## 2017-04-14 NOTE — Telephone Encounter (Signed)
Call in the prescription for the duloxetine for 1 month

## 2017-04-15 ENCOUNTER — Other Ambulatory Visit: Payer: Self-pay

## 2017-04-15 ENCOUNTER — Encounter: Payer: Self-pay | Admitting: Psychiatry

## 2017-04-15 ENCOUNTER — Ambulatory Visit (INDEPENDENT_AMBULATORY_CARE_PROVIDER_SITE_OTHER): Payer: Medicare Other | Admitting: Psychiatry

## 2017-04-15 VITALS — BP 125/78 | HR 80 | Temp 98.8°F

## 2017-04-15 DIAGNOSIS — F332 Major depressive disorder, recurrent severe without psychotic features: Secondary | ICD-10-CM

## 2017-04-15 MED ORDER — BUPROPION HCL ER (SR) 200 MG PO TB12: 200.0000 mg | ORAL_TABLET | Freq: Every morning | ORAL | 1 refills | Status: DC

## 2017-04-15 MED ORDER — DULOXETINE HCL 60 MG PO CPEP: 60.0000 mg | ORAL_CAPSULE | Freq: Every day | ORAL | 1 refills | Status: DC

## 2017-04-15 NOTE — Progress Notes (Signed)
Patient ID: Lori Wagner, female   DOB: 10/05/1950, 66 y.o.   MRN: [id number]   [facility] MD/PA/NP OP Progress Note  04/15/2017 2:56 PM Lori Wagner  MRN:  [id number]  Subjective:  Patient returns for follow-up of her major depressive disorder, recurrent in remission  and panic disorder. She reports being sick all fall with shingles. States that she was also hospitalized. Reports losing 6 to 7 pounds and that she has been in a lot of pain. Reports that she continues to have some pain. Just beginning to heal from the shingles. States that her mood has been okay except for the pain. Denies any suicidal thoughts.  Chief Complaint: doing okay  Visit Diagnosis:     ICD-10-CM   1. Major depressive disorder, recurrent, severe without psychotic features ([place]) F33.2     Past Medical History:  Past Medical History:  Diagnosis Date  . Anxiety   . Arthritis    knees,hip , back and hands  . Chronic kidney disease    Hx of kidney stones, years ago  . Coronary artery disease   . Dysrhythmia    SVT- controlled with Beta blocker  . Fatigue   . HOH (hard of hearing)    tinnitis both ears  . Hypertension     can cause lightheadedness  . Motion sickness   . Neuromuscular disorder ([place])    carpal tunnel both hands  . PONV (postoperative nausea and vomiting)   . RA (rheumatoid arthritis) ([place])   . Sleep apnea    Hx of/ when overweight, no CPAP  . Thyroid disease    HX OF/ NO LONGER TAKING THYROID MEDS, THYROID TESTS CAME BACK NORMAL  . Vertigo    Hx of    Past Surgical History:  Procedure Laterality Date  . ANKLE SURGERY Left   . APPENDECTOMY    . BLADDER REPAIR    . CARDIAC CATHETERIZATION    . CARDIAC SURGERY    . COLONOSCOPY N/A 01/08/2017   Procedure: COLONOSCOPY;  Surgeon: [name], MD;  Location: [facility] ENDOSCOPY;  Service: Gastroenterology;  Laterality: N/A;  . CORONARY ARTERY BYPASS GRAFT  2001   x4  . GASTRIC BYPASS     SLEEVE  . HAND SURGERY Right    2  TRIGGER FINGERS  . KNEE ARTHROSCOPY Right   . renal colic    . TRIGGER FINGER RELEASE Right 01/17/2016   Procedure: RELEASE TRIGGER FINGER/A-1 PULLEY THUMB;  Surgeon: Corky Mull, MD;  Location: [place];  Service: Orthopedics;  Laterality: Right;   Family History:  Family History  Problem Relation Age of Onset  . Hypertension Mother   . Coronary artery disease Mother   . Arthritis Mother   . Osteoporosis Mother   . Depression Mother   . Anxiety disorder Mother   . Coronary artery disease Father   . Stroke Father   . Coronary artery disease Brother   . Bipolar disorder Brother   . Drug abuse Brother   . Coronary artery disease Maternal Aunt   . Osteoporosis Maternal Aunt   . Coronary artery disease Cousin   . Osteoporosis Cousin   . Anxiety disorder Daughter   . Depression Daughter    Social History:  Social History   Socioeconomic History  . Marital status: Married    Spouse name: Not on file  . Number of children: Not on file  . Years of education: Not on file  . Highest education level: Not on file  Social Needs  . Financial resource strain: Not on file  . Food insecurity - worry: Not on file  . Food insecurity - inability: Not on file  . Transportation needs - medical: Not on file  . Transportation needs - non-medical: Not on file  Occupational History  . Not on file  Tobacco Use  . Smoking status: Never Smoker  . Smokeless tobacco: Never Used  Substance and Sexual Activity  . Alcohol use: No  . Drug use: No  . Sexual activity: No  Other Topics Concern  . Not on file  Social History Narrative  . Not on file   Additional History:   Assessment:   Musculoskeletal: Strength & Muscle Tone: within normal limits Gait & Station: normal Patient leans: N/A  Psychiatric Specialty Exam: Medication Refill   Depression         Associated symptoms include insomnia.  Associated symptoms include no suicidal ideas.   Review of Systems   Psychiatric/Behavioral: Negative for depression (at baseline and reporting more good days than bad), hallucinations, memory loss, substance abuse and suicidal ideas. The patient has insomnia. The patient is not nervous/anxious.   All other systems reviewed and are negative.   There were no vitals taken for this visit.There is no height or weight on file to calculate BMI.  General Appearance: Well Groomed  Eye Contact:  Good  Speech:  Normal Rate  Volume:  Normal  Mood:  ok  Affect:  bright  Thought Process:  Linear and Logical  Orientation:  Full (Time, Place, and Person)  Thought Content:  Negative  Suicidal Thoughts:  No  Homicidal Thoughts:  No  Memory:  Immediate;   Good Recent;   Good Remote;   Good  Judgement:  Good  Insight:  Good  Psychomotor Activity:  Negative  Concentration:  Good  Recall:  Good  Fund of Knowledge: Good  Language: Good  Akathisia:  Negative  Handed:  Right  AIMS (if indicated):  N/A  Assets:  Communication Skills Desire for Improvement Social Support  ADL's:  Intact  Cognition: WNL  Sleep:  Good    Is the patient at risk to self?  No. Has the patient been a risk to self in the past 6 months?  No. Has the patient been a risk to self within the distant past?  No. Is the patient a risk to others?  No. Has the patient been a risk to others in the past 6 months?  No. Has the patient been a risk to others within the distant past?  No.  Current Medications: Current Outpatient Medications  Medication Sig Dispense Refill  . aspirin 81 MG tablet Take 162 mg by mouth daily. 2 tablets daily /am    . atorvastatin (LIPITOR) 80 MG tablet Take 80 mg by mouth at bedtime.     . bisacodyl (DULCOLAX) 5 MG EC tablet Take 2 tablets (10 mg total) by mouth daily as needed for moderate constipation. 30 tablet 0  . bisoprolol (ZEBETA) 5 MG tablet Take 2.5 mg by mouth daily.     Marland Kitchen buPROPion (WELLBUTRIN SR) 200 MG 12 hr tablet Take 1 tablet (200 mg total) by mouth  every morning. 90 tablet 0  . calcium gluconate 500 MG tablet Take 1 tablet by mouth daily.    . Cholecalciferol (VITAMIN D-1000 MAX ST) 1000 UNITS tablet Take 1,000 Units by mouth daily.     . digoxin (LANOXIN) 0.125 MG tablet Take 0.125 mg by mouth daily.     Marland Kitchen  DULoxetine (CYMBALTA) 60 MG capsule Take 1 capsule (60 mg total) by mouth daily. 90 capsule 0  . gabapentin (NEURONTIN) 100 MG capsule Take 200 mg by mouth 2 (two) times daily.    Marland Kitchen gabapentin (NEURONTIN) 300 MG capsule Take 300 mg by mouth at bedtime.     . isosorbide mononitrate (IMDUR) 30 MG 24 hr tablet Take 30 mg by mouth daily. am    . Multiple Vitamin (MULTI-VITAMIN DAILY PO) Take 1 tablet by mouth daily.     . nitroGLYCERIN (NITROSTAT) 0.4 MG SL tablet Place 0.4 mg under the tongue every 5 (five) minutes as needed for chest pain.    . Omega-3 Fatty Acids (FISH OIL CONCENTRATE) 1000 MG CAPS Take 1,000 mg by mouth daily.     . ondansetron (ZOFRAN) 4 MG tablet Take 1 tablet (4 mg total) by mouth every 6 (six) hours as needed for nausea. 20 tablet 0  . oxyCODONE-acetaminophen (ROXICET) 5-325 MG tablet Take 1 tablet by mouth every 6 (six) hours as needed. 11 tablet 0   No current facility-administered medications for this visit.     Medical Decision Making:  Established Problem, Stable/Improving (1), Review of Medication Regimen & Side Effects (2) and Review of New Medication or Change in Dosage (2)  Treatment Plan Summary:Medication management and Plan   Major depressive Disorder, recurrent in partial remission  Continue bupropion at 200mg  po qd.  Continue cymbalta at 60mg  once daily.   Insomnia- resolved  Patient will follow up in 3 months. Patient to call before with any questions   Kayna Suppa 04/15/2017, 2:56 PM

## 2017-05-14 ENCOUNTER — Other Ambulatory Visit: Payer: Self-pay | Admitting: Family Medicine

## 2017-05-14 DIAGNOSIS — Z1231 Encounter for screening mammogram for malignant neoplasm of breast: Secondary | ICD-10-CM

## 2017-05-23 ENCOUNTER — Encounter: Payer: Self-pay | Admitting: *Deleted

## 2017-05-23 ENCOUNTER — Other Ambulatory Visit: Payer: Self-pay

## 2017-05-28 NOTE — Discharge Instructions (Signed)

## 2017-06-03 ENCOUNTER — Ambulatory Visit: Payer: Medicare Other | Admitting: Anesthesiology

## 2017-06-03 ENCOUNTER — Ambulatory Visit
Admission: RE | Admit: 2017-06-03 | Discharge: 2017-06-03 | Disposition: A | Discharge status: Home / Self Care | Payer: Medicare Other | Source: Ambulatory Visit | Attending: Ophthalmology | Admitting: Ophthalmology

## 2017-06-03 ENCOUNTER — Encounter
Admission: RE | Disposition: A | Discharge status: Home / Self Care | Payer: Self-pay | Source: Ambulatory Visit | Attending: Ophthalmology

## 2017-06-03 DIAGNOSIS — Z9884 Bariatric surgery status: Secondary | ICD-10-CM | POA: Insufficient documentation

## 2017-06-03 DIAGNOSIS — B0229 Other postherpetic nervous system involvement: Secondary | ICD-10-CM | POA: Insufficient documentation

## 2017-06-03 DIAGNOSIS — Z951 Presence of aortocoronary bypass graft: Secondary | ICD-10-CM | POA: Insufficient documentation

## 2017-06-03 DIAGNOSIS — I2510 Atherosclerotic heart disease of native coronary artery without angina pectoris: Secondary | ICD-10-CM | POA: Insufficient documentation

## 2017-06-03 DIAGNOSIS — H2512 Age-related nuclear cataract, left eye: Secondary | ICD-10-CM | POA: Insufficient documentation

## 2017-06-03 DIAGNOSIS — F418 Other specified anxiety disorders: Secondary | ICD-10-CM | POA: Insufficient documentation

## 2017-06-03 HISTORY — PX: CATARACT EXTRACTION W/PHACO: SHX586

## 2017-06-03 HISTORY — DX: Other postherpetic nervous system involvement: B02.29

## 2017-06-03 HISTORY — DX: Zoster without complications: B02.9

## 2017-06-03 SURGERY — PHACOEMULSIFICATION, CATARACT, WITH IOL INSERTION
Anesthesia: Monitor Anesthesia Care | Laterality: Left | Wound class: Clean

## 2017-06-03 MED ORDER — ARMC OPHTHALMIC DILATING DROPS
1.0000 "application " | OPHTHALMIC | Status: DC | PRN
  Administered 2017-06-03 (×3): 1 via OPHTHALMIC

## 2017-06-03 MED ORDER — LIDOCAINE HCL (PF) 2 % IJ SOLN
INTRAOCULAR | Status: DC | PRN
  Administered 2017-06-03: 1 mL via INTRAOCULAR

## 2017-06-03 MED ORDER — TETRACAINE HCL 0.5 % OP SOLN
OPHTHALMIC | Status: DC | PRN
  Administered 2017-06-03: 1 [drp] via OPHTHALMIC

## 2017-06-03 MED ORDER — LACTATED RINGERS IV SOLN: INTRAVENOUS | Status: DC

## 2017-06-03 MED ORDER — MIDAZOLAM HCL 2 MG/2ML IJ SOLN
INTRAMUSCULAR | Status: DC | PRN
  Administered 2017-06-03 (×2): 1 mg via INTRAVENOUS

## 2017-06-03 MED ORDER — ONDANSETRON HCL 4 MG/2ML IJ SOLN: 4.0000 mg | Freq: Once | INTRAMUSCULAR | Status: DC | PRN

## 2017-06-03 MED ORDER — ACETAMINOPHEN 325 MG PO TABS: 650.0000 mg | ORAL_TABLET | Freq: Once | ORAL | Status: DC | PRN

## 2017-06-03 MED ORDER — FENTANYL CITRATE (PF) 100 MCG/2ML IJ SOLN
INTRAMUSCULAR | Status: DC | PRN
  Administered 2017-06-03: 50 ug via INTRAVENOUS

## 2017-06-03 MED ORDER — ACETAMINOPHEN 160 MG/5ML PO SOLN: 325.0000 mg | ORAL | Status: DC | PRN

## 2017-06-03 MED ORDER — SODIUM HYALURONATE 10 MG/ML IO SOLN
INTRAOCULAR | Status: DC | PRN
  Administered 2017-06-03: 0.55 mL via INTRAOCULAR

## 2017-06-03 MED ORDER — SODIUM HYALURONATE 23 MG/ML IO SOLN
INTRAOCULAR | Status: DC | PRN
  Administered 2017-06-03: 0.6 mL via INTRAOCULAR

## 2017-06-03 MED ORDER — EPINEPHRINE PF 1 MG/ML IJ SOLN
INTRAOCULAR | Status: DC | PRN
  Administered 2017-06-03: 102 mL via OPHTHALMIC

## 2017-06-03 MED ORDER — MOXIFLOXACIN HCL 0.5 % OP SOLN
OPHTHALMIC | Status: DC | PRN
  Administered 2017-06-03: 0.2 mL via OPHTHALMIC

## 2017-06-03 SURGICAL SUPPLY — 16 items
CANNULA ANT/CHMB 27GA (MISCELLANEOUS) ×3 IMPLANT
DISSECTOR HYDRO NUCLEUS 50X22 (MISCELLANEOUS) ×3 IMPLANT
GLOVE BIO SURGEON STRL SZ8 (GLOVE) ×3 IMPLANT
GLOVE SURG LX 7.5 STRW (GLOVE) ×2
GLOVE SURG LX STRL 7.5 STRW (GLOVE) ×1 IMPLANT
GOWN STRL REUS W/ TWL LRG LVL3 (GOWN DISPOSABLE) ×2 IMPLANT
GOWN STRL REUS W/TWL LRG LVL3 (GOWN DISPOSABLE) ×4
LENS IOL TECNIS ITEC 19.5 (Intraocular Lens) ×3 IMPLANT
MARKER SKIN DUAL TIP RULER LAB (MISCELLANEOUS) ×3 IMPLANT
PACK CATARACT (MISCELLANEOUS) ×3 IMPLANT
PACK DR. KING ARMS (PACKS) ×3 IMPLANT
PACK EYE AFTER SURG (MISCELLANEOUS) ×3 IMPLANT
SYR 3ML LL SCALE MARK (SYRINGE) ×3 IMPLANT
SYR TB 1ML LUER SLIP (SYRINGE) ×3 IMPLANT
WATER STERILE IRR 500ML POUR (IV SOLUTION) ×3 IMPLANT
WIPE NON LINTING 3.25X3.25 (MISCELLANEOUS) ×3 IMPLANT

## 2017-06-03 NOTE — Anesthesia Postprocedure Evaluation (Signed)
Anesthesia Post Note  Patient: Lori Wagner  Procedure(s) Performed: CATARACT EXTRACTION PHACO AND INTRAOCULAR LENS PLACEMENT (IOC)  LEFT (Left )  Patient location during evaluation: PACU Anesthesia Type: MAC Level of consciousness: awake and alert, oriented and patient cooperative Pain management: pain level controlled Vital Signs Assessment: post-procedure vital signs reviewed and stable Respiratory status: spontaneous breathing, nonlabored ventilation and respiratory function stable Cardiovascular status: blood pressure returned to baseline and stable Postop Assessment: adequate PO intake Anesthetic complications: no    Darrin Nipper

## 2017-06-03 NOTE — Transfer of Care (Signed)
Immediate Anesthesia Transfer of Care Note  Patient: Lori Wagner  Procedure(s) Performed: CATARACT EXTRACTION PHACO AND INTRAOCULAR LENS PLACEMENT (IOC)  LEFT (Left )  Patient Location: PACU  Anesthesia Type: MAC  Level of Consciousness: awake, alert  and patient cooperative  Airway and Oxygen Therapy: Patient Spontanous Breathing and Patient connected to supplemental oxygen  Post-op Assessment: Post-op Vital signs reviewed, Patient's Cardiovascular Status Stable, Respiratory Function Stable, Patent Airway and No signs of Nausea or vomiting  Post-op Vital Signs: Reviewed and stable  Complications: No apparent anesthesia complications

## 2017-06-03 NOTE — Anesthesia Procedure Notes (Signed)
Procedure Name: MAC Date/Time: 06/03/2017 8:12 AM Performed by: Cameron Ali, CRNA Pre-anesthesia Checklist: Patient identified, Emergency Drugs available, Suction available, Timeout performed and Patient being monitored Patient Re-evaluated:Patient Re-evaluated prior to induction Oxygen Delivery Method: Nasal cannula Placement Confirmation: positive ETCO2

## 2017-06-03 NOTE — Op Note (Signed)
OPERATIVE NOTE  Lori Wagner [id number] 06/03/2017   PREOPERATIVE DIAGNOSIS:  Nuclear sclerotic cataract left eye.  H25.12   POSTOPERATIVE DIAGNOSIS:    Nuclear sclerotic cataract left eye.     PROCEDURE:  Phacoemusification with posterior chamber intraocular lens placement of the left eye   LENS:   Implant Name Type Inv. Item Serial No. Manufacturer Lot No. LRB No. Used  LENS IOL DIOP 19.5 - [id number] Intraocular Lens LENS IOL DIOP 19.5 [id number] AMO  Left 1       PCB00 +19.5   ULTRASOUND TIME: 0 minutes 36.4 seconds.  CDE 5.22   SURGEON:  Benay Pillow, MD, MPH   ANESTHESIA:  Topical with tetracaine drops augmented with 1% preservative-free intracameral lidocaine.  ESTIMATED BLOOD LOSS: <1 mL   COMPLICATIONS:  None.   DESCRIPTION OF PROCEDURE:  The patient was identified in the holding room and transported to the operating room and placed in the supine position under the operating microscope.  The left eye was identified as the operative eye and it was prepped and draped in the usual sterile ophthalmic fashion.   A 1.0 millimeter clear-corneal paracentesis was made at the 5:00 position. 0.5 ml of preservative-free 1% lidocaine with epinephrine was injected into the anterior chamber.  The anterior chamber was filled with Healon 5 viscoelastic.  A 2.4 millimeter keratome was used to make a near-clear corneal incision at the 2:00 position.  A curvilinear capsulorrhexis was made with a cystotome and capsulorrhexis forceps.  Balanced salt solution was used to hydrodissect and hydrodelineate the nucleus.   Phacoemulsification was then used in stop and chop fashion to remove the lens nucleus and epinucleus.  The remaining cortex was then removed using the irrigation and aspiration handpiece. Healon was then placed into the capsular bag to distend it for lens placement.  A lens was then injected into the capsular bag.  The remaining viscoelastic was aspirated.   Wounds were  hydrated with balanced salt solution.  The anterior chamber was inflated to a physiologic pressure with balanced salt solution.  Intracameral vigamox 0.1 mL undiltued was injected into the eye and a drop placed onto the ocular surface.  No wound leaks were noted.  The patient was taken to the recovery room in stable condition without complications of anesthesia or surgery  Benay Pillow 06/03/2017, 8:40 AM

## 2017-06-03 NOTE — Anesthesia Preprocedure Evaluation (Signed)
Anesthesia Evaluation  Patient identified by MRN, date of birth, ID band Patient awake    Reviewed: Allergy & Precautions, NPO status , Patient's Chart, lab work & pertinent test results  History of Anesthesia Complications (+) PONV and history of anesthetic complications  Airway Mallampati: III  TM Distance: >3 FB Neck ROM: Full    Dental no notable dental hx.    Pulmonary neg pulmonary ROS,    Pulmonary exam normal breath sounds clear to auscultation       Cardiovascular Exercise Tolerance: Good + CAD (s/p CABG 2001)  Normal cardiovascular exam+ dysrhythmias (SVT on digoxin and beta blocker)  Rhythm:Regular Rate:Normal     Neuro/Psych PSYCHIATRIC DISORDERS Anxiety Depression  Neuromuscular disease (postherpetic neuralgia affecting left face, jaw, and teeth)    GI/Hepatic S/p gastric bypass   Endo/Other  negative endocrine ROS  Renal/GU negative Renal ROS     Musculoskeletal   Abdominal   Peds  Hematology negative hematology ROS (+)   Anesthesia Other Findings   Reproductive/Obstetrics                             Anesthesia Physical Anesthesia Plan  ASA: III  Anesthesia Plan: MAC   Post-op Pain Management:    Induction: Intravenous  PONV Risk Score and Plan: 2 and Midazolam and TIVA  Airway Management Planned: Natural Airway  Additional Equipment:   Intra-op Plan:   Post-operative Plan:   Informed Consent: I have reviewed the patients History and Physical, chart, labs and discussed the procedure including the risks, benefits and alternatives for the proposed anesthesia with the patient or authorized representative who has indicated his/her understanding and acceptance.     Plan Discussed with: CRNA  Anesthesia Plan Comments:         Anesthesia Quick Evaluation

## 2017-06-03 NOTE — H&P (Signed)
The History and Physical notes are on paper, have been signed, and are to be scanned.   I have examined the patient and there are no changes to the H&P.   Lori Wagner 06/03/2017 7:56 AM

## 2017-06-11 ENCOUNTER — Ambulatory Visit
Admission: RE | Admit: 2017-06-11 | Discharge: 2017-06-11 | Disposition: A | Discharge status: Home / Self Care | Payer: Medicare Other | Source: Ambulatory Visit | Attending: Family Medicine | Admitting: Family Medicine

## 2017-06-11 DIAGNOSIS — Z1231 Encounter for screening mammogram for malignant neoplasm of breast: Secondary | ICD-10-CM | POA: Insufficient documentation

## 2017-07-03 DIAGNOSIS — R42 Dizziness and giddiness: Secondary | ICD-10-CM | POA: Insufficient documentation

## 2017-07-03 DIAGNOSIS — I2510 Atherosclerotic heart disease of native coronary artery without angina pectoris: Secondary | ICD-10-CM | POA: Insufficient documentation

## 2017-07-03 DIAGNOSIS — I959 Hypotension, unspecified: Secondary | ICD-10-CM | POA: Insufficient documentation

## 2017-07-07 NOTE — Discharge Instructions (Signed)

## 2017-07-08 ENCOUNTER — Encounter
Admission: RE | Disposition: A | Discharge status: Home / Self Care | Payer: Self-pay | Source: Ambulatory Visit | Attending: Ophthalmology

## 2017-07-08 ENCOUNTER — Ambulatory Visit
Admission: RE | Admit: 2017-07-08 | Discharge: 2017-07-08 | Disposition: A | Discharge status: Home / Self Care | Payer: Medicare Other | Source: Ambulatory Visit | Attending: Ophthalmology | Admitting: Ophthalmology

## 2017-07-08 ENCOUNTER — Ambulatory Visit: Payer: Medicare Other | Admitting: Anesthesiology

## 2017-07-08 DIAGNOSIS — Z885 Allergy status to narcotic agent status: Secondary | ICD-10-CM | POA: Diagnosis not present

## 2017-07-08 DIAGNOSIS — Z888 Allergy status to other drugs, medicaments and biological substances status: Secondary | ICD-10-CM | POA: Insufficient documentation

## 2017-07-08 DIAGNOSIS — Z951 Presence of aortocoronary bypass graft: Secondary | ICD-10-CM | POA: Diagnosis not present

## 2017-07-08 DIAGNOSIS — H2511 Age-related nuclear cataract, right eye: Secondary | ICD-10-CM | POA: Insufficient documentation

## 2017-07-08 DIAGNOSIS — Z882 Allergy status to sulfonamides status: Secondary | ICD-10-CM | POA: Diagnosis not present

## 2017-07-08 DIAGNOSIS — E7800 Pure hypercholesterolemia, unspecified: Secondary | ICD-10-CM | POA: Diagnosis not present

## 2017-07-08 DIAGNOSIS — Z9884 Bariatric surgery status: Secondary | ICD-10-CM | POA: Diagnosis not present

## 2017-07-08 DIAGNOSIS — F419 Anxiety disorder, unspecified: Secondary | ICD-10-CM | POA: Insufficient documentation

## 2017-07-08 DIAGNOSIS — I2510 Atherosclerotic heart disease of native coronary artery without angina pectoris: Secondary | ICD-10-CM | POA: Insufficient documentation

## 2017-07-08 DIAGNOSIS — I10 Essential (primary) hypertension: Secondary | ICD-10-CM | POA: Diagnosis not present

## 2017-07-08 DIAGNOSIS — B0229 Other postherpetic nervous system involvement: Secondary | ICD-10-CM | POA: Insufficient documentation

## 2017-07-08 DIAGNOSIS — Z880 Allergy status to penicillin: Secondary | ICD-10-CM | POA: Insufficient documentation

## 2017-07-08 DIAGNOSIS — F329 Major depressive disorder, single episode, unspecified: Secondary | ICD-10-CM | POA: Diagnosis not present

## 2017-07-08 DIAGNOSIS — I471 Supraventricular tachycardia: Secondary | ICD-10-CM | POA: Insufficient documentation

## 2017-07-08 HISTORY — PX: CATARACT EXTRACTION W/PHACO: SHX586

## 2017-07-08 SURGERY — PHACOEMULSIFICATION, CATARACT, WITH IOL INSERTION
Anesthesia: Monitor Anesthesia Care | Site: Eye | Laterality: Right | Wound class: Clean

## 2017-07-08 MED ORDER — MEPERIDINE HCL 25 MG/ML IJ SOLN: 6.2500 mg | INTRAMUSCULAR | Status: DC | PRN

## 2017-07-08 MED ORDER — EPINEPHRINE PF 1 MG/ML IJ SOLN
INTRAOCULAR | Status: DC | PRN
  Administered 2017-07-08: 108 mL via OPHTHALMIC

## 2017-07-08 MED ORDER — FENTANYL CITRATE (PF) 100 MCG/2ML IJ SOLN: 25.0000 ug | INTRAMUSCULAR | Status: DC | PRN

## 2017-07-08 MED ORDER — MIDAZOLAM HCL 2 MG/2ML IJ SOLN
INTRAMUSCULAR | Status: DC | PRN
  Administered 2017-07-08 (×2): 1 mg via INTRAVENOUS

## 2017-07-08 MED ORDER — ARMC OPHTHALMIC DILATING DROPS
1.0000 "application " | OPHTHALMIC | Status: DC | PRN
  Administered 2017-07-08 (×3): 1 via OPHTHALMIC

## 2017-07-08 MED ORDER — OXYCODONE HCL 5 MG/5ML PO SOLN: 5.0000 mg | Freq: Once | ORAL | Status: DC | PRN

## 2017-07-08 MED ORDER — FENTANYL CITRATE (PF) 100 MCG/2ML IJ SOLN
INTRAMUSCULAR | Status: DC | PRN
  Administered 2017-07-08 (×2): 50 ug via INTRAVENOUS

## 2017-07-08 MED ORDER — SODIUM HYALURONATE 10 MG/ML IO SOLN
INTRAOCULAR | Status: DC | PRN
  Administered 2017-07-08: 1 mL via INTRAOCULAR

## 2017-07-08 MED ORDER — LIDOCAINE HCL (PF) 2 % IJ SOLN
INTRAOCULAR | Status: DC | PRN
  Administered 2017-07-08: 1 mL via INTRAOCULAR

## 2017-07-08 MED ORDER — PROMETHAZINE HCL 25 MG/ML IJ SOLN: 6.2500 mg | INTRAMUSCULAR | Status: DC | PRN

## 2017-07-08 MED ORDER — LACTATED RINGERS IV SOLN: 1000.0000 mL | INTRAVENOUS | Status: DC

## 2017-07-08 MED ORDER — MOXIFLOXACIN HCL 0.5 % OP SOLN
OPHTHALMIC | Status: DC | PRN
  Administered 2017-07-08: 1 [drp] via OPHTHALMIC

## 2017-07-08 MED ORDER — OXYCODONE HCL 5 MG PO TABS: 5.0000 mg | ORAL_TABLET | Freq: Once | ORAL | Status: DC | PRN

## 2017-07-08 MED ORDER — SODIUM HYALURONATE 23 MG/ML IO SOLN
INTRAOCULAR | Status: DC | PRN
  Administered 2017-07-08: 0.6 mL via INTRAOCULAR

## 2017-07-08 SURGICAL SUPPLY — 16 items
CANNULA ANT/CHMB 27GA (MISCELLANEOUS) ×3 IMPLANT
DISSECTOR HYDRO NUCLEUS 50X22 (MISCELLANEOUS) ×3 IMPLANT
GLOVE BIO SURGEON STRL SZ8 (GLOVE) ×3 IMPLANT
GLOVE SURG LX 7.5 STRW (GLOVE) ×2
GLOVE SURG LX STRL 7.5 STRW (GLOVE) ×1 IMPLANT
GOWN STRL REUS W/ TWL LRG LVL3 (GOWN DISPOSABLE) ×2 IMPLANT
GOWN STRL REUS W/TWL LRG LVL3 (GOWN DISPOSABLE) ×4
LENS IOL TECNIS ITEC 20.0 (Intraocular Lens) ×3 IMPLANT
MARKER SKIN DUAL TIP RULER LAB (MISCELLANEOUS) ×3 IMPLANT
PACK CATARACT (MISCELLANEOUS) ×3 IMPLANT
PACK DR. KING ARMS (PACKS) ×3 IMPLANT
PACK EYE AFTER SURG (MISCELLANEOUS) ×3 IMPLANT
SYR 3ML LL SCALE MARK (SYRINGE) ×3 IMPLANT
SYR TB 1ML LUER SLIP (SYRINGE) ×3 IMPLANT
WATER STERILE IRR 500ML POUR (IV SOLUTION) ×3 IMPLANT
WIPE NON LINTING 3.25X3.25 (MISCELLANEOUS) ×3 IMPLANT

## 2017-07-08 NOTE — Anesthesia Preprocedure Evaluation (Signed)
Anesthesia Evaluation  Patient identified by MRN, date of birth, ID band Patient awake    Reviewed: Allergy & Precautions, NPO status , Patient's Chart, lab work & pertinent test results  History of Anesthesia Complications (+) PONV and history of anesthetic complications  Airway Mallampati: III  TM Distance: >3 FB Neck ROM: Full    Dental no notable dental hx.    Pulmonary neg pulmonary ROS,    Pulmonary exam normal breath sounds clear to auscultation       Cardiovascular Exercise Tolerance: Good hypertension, + CAD (s/p CABG 2001)  Normal cardiovascular exam+ dysrhythmias (SVT on digoxin and beta blocker)  Rhythm:Regular Rate:Normal     Neuro/Psych PSYCHIATRIC DISORDERS Anxiety Depression  Neuromuscular disease (postherpetic neuralgia affecting left face, jaw, and teeth)    GI/Hepatic S/p gastric bypass   Endo/Other  negative endocrine ROS  Renal/GU negative Renal ROS     Musculoskeletal   Abdominal   Peds  Hematology negative hematology ROS (+)   Anesthesia Other Findings   Reproductive/Obstetrics                             Anesthesia Physical  Anesthesia Plan  ASA: III  Anesthesia Plan: MAC   Post-op Pain Management:    Induction: Intravenous  PONV Risk Score and Plan: 2 and Midazolam and TIVA  Airway Management Planned: Natural Airway  Additional Equipment:   Intra-op Plan:   Post-operative Plan:   Informed Consent: I have reviewed the patients History and Physical, chart, labs and discussed the procedure including the risks, benefits and alternatives for the proposed anesthesia with the patient or authorized representative who has indicated his/her understanding and acceptance.     Plan Discussed with: CRNA  Anesthesia Plan Comments:         Anesthesia Quick Evaluation

## 2017-07-08 NOTE — H&P (Signed)
The History and Physical notes are on paper, have been signed, and are to be scanned.   I have examined the patient and there are no changes to the H&P.   Benay Pillow 07/08/2017 7:29 AM

## 2017-07-08 NOTE — Op Note (Signed)
OPERATIVE NOTE  Lori Wagner [id number] 07/08/2017   PREOPERATIVE DIAGNOSIS:  Nuclear sclerotic cataract right eye.  H25.11   POSTOPERATIVE DIAGNOSIS:    Nuclear sclerotic cataract right eye.     PROCEDURE:  Phacoemusification with posterior chamber intraocular lens placement of the right eye   LENS:   Implant Name Type Inv. Item Serial No. Manufacturer Lot No. LRB No. Used  LENS IOL DIOP 20.0 - [id number] Intraocular Lens LENS IOL DIOP 20.0 [id number] AMO  Right 1       PCB00 +20.0   ULTRASOUND TIME: 0 minutes 37.6 seconds.  CDE 6.69   SURGEON:  Benay Pillow, MD, MPH  ANESTHESIOLOGIST: Anesthesiologist: Marice Potter, MD CRNA: Cameron Ali, CRNA   ANESTHESIA:  Topical with tetracaine drops augmented with 1% preservative-free intracameral lidocaine.  ESTIMATED BLOOD LOSS: less than 1 mL.   COMPLICATIONS:  None.   DESCRIPTION OF PROCEDURE:  The patient was identified in the holding room and transported to the operating room and placed in the supine position under the operating microscope.  The right eye was identified as the operative eye and it was prepped and draped in the usual sterile ophthalmic fashion.   A 1.0 millimeter clear-corneal paracentesis was made at the 10:30 position. 0.5 ml of preservative-free 1% lidocaine with epinephrine was injected into the anterior chamber.  The anterior chamber was filled with Healon 5 viscoelastic.  A 2.4 millimeter keratome was used to make a near-clear corneal incision at the 8:00 position.  A curvilinear capsulorrhexis was made with a cystotome and capsulorrhexis forceps.  Balanced salt solution was used to hydrodissect and hydrodelineate the nucleus.   Phacoemulsification was then used in stop and chop fashion to remove the lens nucleus and epinucleus.  The remaining cortex was then removed using the irrigation and aspiration handpiece. Healon was then placed into the capsular bag to distend it for lens placement.  A  lens was then injected into the capsular bag.  The remaining viscoelastic was aspirated.   Wounds were hydrated with balanced salt solution.  The anterior chamber was inflated to a physiologic pressure with balanced salt solution.   Intracameral vigamox 0.1 mL undiluted was injected into the eye and a drop placed onto the ocular surface.  No wound leaks were noted.  The patient was taken to the recovery room in stable condition without complications of anesthesia or surgery  Benay Pillow 07/08/2017, 8:12 AM

## 2017-07-08 NOTE — Transfer of Care (Signed)
Immediate Anesthesia Transfer of Care Note  Patient: Lori Wagner  Procedure(s) Performed: CATARACT EXTRACTION PHACO AND INTRAOCULAR LENS PLACEMENT (IOC) RIGHT (Right Eye)  Patient Location: PACU  Anesthesia Type: MAC  Level of Consciousness: awake, alert  and patient cooperative  Airway and Oxygen Therapy: Patient Spontanous Breathing and Patient connected to supplemental oxygen  Post-op Assessment: Post-op Vital signs reviewed, Patient's Cardiovascular Status Stable, Respiratory Function Stable, Patent Airway and No signs of Nausea or vomiting  Post-op Vital Signs: Reviewed and stable  Complications: No apparent anesthesia complications

## 2017-07-08 NOTE — Anesthesia Postprocedure Evaluation (Signed)
Anesthesia Post Note  Patient: Lori Wagner  Procedure(s) Performed: CATARACT EXTRACTION PHACO AND INTRAOCULAR LENS PLACEMENT (IOC) RIGHT (Right Eye)  Patient location during evaluation: PACU Anesthesia Type: MAC Level of consciousness: awake and alert Pain management: pain level controlled Vital Signs Assessment: post-procedure vital signs reviewed and stable Respiratory status: spontaneous breathing, nonlabored ventilation, respiratory function stable and patient connected to nasal cannula oxygen Cardiovascular status: blood pressure returned to baseline and stable Postop Assessment: no apparent nausea or vomiting Anesthetic complications: no    Jaidalyn Schillo ELAINE

## 2017-07-08 NOTE — Anesthesia Procedure Notes (Signed)
Procedure Name: MAC Date/Time: 07/08/2017 7:37 AM Performed by: Cameron Ali, CRNA Pre-anesthesia Checklist: Patient identified, Emergency Drugs available, Suction available, Timeout performed and Patient being monitored Patient Re-evaluated:Patient Re-evaluated prior to induction Oxygen Delivery Method: Nasal cannula Placement Confirmation: positive ETCO2

## 2017-07-15 ENCOUNTER — Ambulatory Visit: Payer: Medicare Other | Admitting: Psychiatry

## 2017-07-22 ENCOUNTER — Other Ambulatory Visit: Payer: Self-pay

## 2017-07-22 ENCOUNTER — Ambulatory Visit: Payer: Medicare Other | Admitting: Psychiatry

## 2017-07-22 ENCOUNTER — Encounter: Payer: Self-pay | Admitting: Psychiatry

## 2017-07-22 VITALS — BP 147/77 | HR 83 | Temp 97.5°F

## 2017-07-22 DIAGNOSIS — F332 Major depressive disorder, recurrent severe without psychotic features: Secondary | ICD-10-CM | POA: Diagnosis not present

## 2017-07-22 MED ORDER — DULOXETINE HCL 60 MG PO CPEP: 60.0000 mg | ORAL_CAPSULE | Freq: Every day | ORAL | 1 refills | Status: DC

## 2017-07-22 MED ORDER — TRAZODONE HCL 50 MG PO TABS: 50.0000 mg | ORAL_TABLET | Freq: Every day | ORAL | 1 refills | Status: DC

## 2017-07-22 MED ORDER — BUPROPION HCL ER (SR) 200 MG PO TB12: 200.0000 mg | ORAL_TABLET | Freq: Every morning | ORAL | 1 refills | Status: DC

## 2017-07-22 NOTE — Progress Notes (Signed)
Patient ID: Lori Wagner, female   DOB: 1950-09-15, 67 y.o.   MRN: [id number]   [facility] MD/PA/NP OP Progress Note  07/22/2017 3:57 PM Lori Wagner  MRN:  [id number]  Subjective:  Patient returns for follow-up of her major depressive disorder, recurrent in remission  and panic disorder. Patient reports that overall her mood has been better. She had shingles in the fall and recovered. States that she did not get sick this winter.. She reports that she cut down on her volunteer activities and took a part-time job. States that financially she needed to do that. Patient states she has been having some trouble sleeping. We discussed starting her on trazodone at 50 mg at bedtime. States that she would like to try that.  Chief Complaint: sleep issues Chief Complaint    Follow-up; Medication Refill     Visit Diagnosis:     ICD-10-CM   1. Major depressive disorder, recurrent, severe without psychotic features ([place]) F33.2     Past Medical History:  Past Medical History:  Diagnosis Date  . Anxiety   . Arthritis    knees,hip , back and hands  . Chronic kidney disease    Hx of kidney stones, years ago  . Coronary artery disease   . Dysrhythmia    SVT- controlled with Beta blocker  . Fatigue   . HOH (hard of hearing)    tinnitis both ears  . Hypertension     can cause lightheadedness  . Motion sickness   . Neuromuscular disorder ([place])    carpal tunnel both hands  . PONV (postoperative nausea and vomiting)   . Post herpetic neuralgia    left side of face  . RA (rheumatoid arthritis) ([place])   . Shingles 02/2017   left side of face  . Sleep apnea    Hx of/ when overweight, no CPAP  . Thyroid disease    HX OF/ NO LONGER TAKING THYROID MEDS, THYROID TESTS CAME BACK NORMAL  . Vertigo    Hx of    Past Surgical History:  Procedure Laterality Date  . ANKLE SURGERY Left   . APPENDECTOMY    . BLADDER REPAIR    . CARDIAC CATHETERIZATION    . CARDIAC SURGERY    . CATARACT  EXTRACTION W/PHACO Left 06/03/2017   Procedure: CATARACT EXTRACTION PHACO AND INTRAOCULAR LENS PLACEMENT ([place])  LEFT;  Surgeon: Eulogio Bear, MD;  Location: [place];  Service: Ophthalmology;  Laterality: Left;  . CATARACT EXTRACTION W/PHACO Right 07/08/2017   Procedure: CATARACT EXTRACTION PHACO AND INTRAOCULAR LENS PLACEMENT ([place]) RIGHT;  Surgeon: Eulogio Bear, MD;  Location: [place];  Service: Ophthalmology;  Laterality: Right;  . COLONOSCOPY N/A 01/08/2017   Procedure: COLONOSCOPY;  Surgeon: [name], MD;  Location: [facility];  Service: Gastroenterology;  Laterality: N/A;  . CORONARY ARTERY BYPASS GRAFT  2001   x4  . GASTRIC BYPASS     SLEEVE  . HAND SURGERY Right    2 TRIGGER FINGERS  . KNEE ARTHROSCOPY Right   . renal colic    . TRIGGER FINGER RELEASE Right 01/17/2016   Procedure: RELEASE TRIGGER FINGER/A-1 PULLEY THUMB;  Surgeon: Corky Mull, MD;  Location: [place];  Service: Orthopedics;  Laterality: Right;   Family History:  Family History  Problem Relation Age of Onset  . Hypertension Mother   . Coronary artery disease Mother   . Arthritis Mother   . Osteoporosis Mother   . Depression Mother   .  Anxiety disorder Mother   . Coronary artery disease Father   . Stroke Father   . Coronary artery disease Brother   . Bipolar disorder Brother   . Drug abuse Brother   . Coronary artery disease Maternal Aunt   . Osteoporosis Maternal Aunt   . Coronary artery disease Cousin   . Osteoporosis Cousin   . Anxiety disorder Daughter   . Depression Daughter   . Breast cancer Neg Hx    Social History:  Social History   Socioeconomic History  . Marital status: Married    Spouse name: Iona Beard  . Number of children: 1  . Years of education: None  . Highest education level: Bachelor's degree (e.g., BA, AB, BS)  Social Needs  . Financial resource strain: Not very hard  . Food insecurity - worry: Never true  . Food  insecurity - inability: Never true  . Transportation needs - medical: No  . Transportation needs - non-medical: No  Occupational History    Comment: retired  Tobacco Use  . Smoking status: Never Smoker  . Smokeless tobacco: Never Used  Substance and Sexual Activity  . Alcohol use: No  . Drug use: No  . Sexual activity: No  Other Topics Concern  . None  Social History Narrative  . None   Additional History:   Assessment:   Musculoskeletal: Strength & Muscle Tone: within normal limits Gait & Station: normal Patient leans: N/A  Psychiatric Specialty Exam: Medication Refill   Depression         Associated symptoms include insomnia.  Associated symptoms include no suicidal ideas.   Review of Systems  Psychiatric/Behavioral: Negative for depression (at baseline and reporting more good days than bad), hallucinations, memory loss, substance abuse and suicidal ideas. The patient has insomnia. The patient is not nervous/anxious.   All other systems reviewed and are negative.   Blood pressure (!) 147/77, pulse 83, temperature (!) 97.5 F (36.4 C), temperature source Oral.There is no height or weight on file to calculate BMI.  General Appearance: Well Groomed  Eye Contact:  Good  Speech:  Normal Rate  Volume:  Normal  Mood:  ok  Affect:  bright  Thought Process:  Linear and Logical  Orientation:  Full (Time, Place, and Person)  Thought Content:  Negative  Suicidal Thoughts:  No  Homicidal Thoughts:  No  Memory:  Immediate;   Good Recent;   Good Remote;   Good  Judgement:  Good  Insight:  Good  Psychomotor Activity:  Negative  Concentration:  Good  Recall:  Good  Fund of Knowledge: Good  Language: Good  Akathisia:  Negative  Handed:  Right  AIMS (if indicated):  N/A  Assets:  Communication Skills Desire for Improvement Social Support  ADL's:  Intact  Cognition: WNL  Sleep:  Not good    Is the patient at risk to self?  No. Has the patient been a risk to self  in the past 6 months?  No. Has the patient been a risk to self within the distant past?  No. Is the patient a risk to others?  No. Has the patient been a risk to others in the past 6 months?  No. Has the patient been a risk to others within the distant past?  No.  Current Medications: Current Outpatient Medications  Medication Sig Dispense Refill  . aspirin 81 MG tablet Take 162 mg by mouth daily. 2 tablets daily /am    . atorvastatin (LIPITOR) 80 MG tablet  Take 80 mg by mouth at bedtime.     . bisoprolol (ZEBETA) 5 MG tablet Take 2.5 mg by mouth daily.     Marland Kitchen buPROPion (WELLBUTRIN SR) 200 MG 12 hr tablet Take 1 tablet (200 mg total) by mouth every morning. 90 tablet 1  . digoxin (LANOXIN) 0.125 MG tablet Take 0.125 mg by mouth daily.     . DULoxetine (CYMBALTA) 60 MG capsule Take 1 capsule (60 mg total) by mouth daily. 90 capsule 1  . gabapentin (NEURONTIN) 300 MG capsule Take 300 mg by mouth at bedtime.     . isosorbide mononitrate (IMDUR) 30 MG 24 hr tablet Take 30 mg by mouth daily. am    . nitroGLYCERIN (NITROSTAT) 0.4 MG SL tablet Place 0.4 mg under the tongue every 5 (five) minutes as needed for chest pain.    . Polyethylene Glycol 3350 (MIRALAX PO) Take by mouth daily as needed.    . valACYclovir (VALTREX) 1000 MG tablet Take by mouth.    . traZODone (DESYREL) 50 MG tablet Take 1 tablet (50 mg total) by mouth at bedtime. 30 tablet 1   No current facility-administered medications for this visit.     Medical Decision Making:  Established Problem, Stable/Improving (1), Review of Medication Regimen & Side Effects (2) and Review of New Medication or Change in Dosage (2)  Treatment Plan Summary:Medication management and Plan   Major depressive Disorder, recurrent in partial remission  Continue bupropion at 200mg  po qd.  Continue cymbalta at 60mg  once daily.   Insomnia- start trazodone at 50 mg at bedtime.  Patient will follow up in 3 months. Patient to call before with any  questions   Caswell Alvillar 07/22/2017, 3:57 PM

## 2017-07-29 ENCOUNTER — Other Ambulatory Visit: Payer: Self-pay | Admitting: Otolaryngology

## 2017-07-29 DIAGNOSIS — R51 Headache: Principal | ICD-10-CM

## 2017-07-29 DIAGNOSIS — R519 Headache, unspecified: Secondary | ICD-10-CM

## 2017-07-29 DIAGNOSIS — R42 Dizziness and giddiness: Secondary | ICD-10-CM

## 2017-07-29 DIAGNOSIS — H9192 Unspecified hearing loss, left ear: Secondary | ICD-10-CM

## 2017-08-07 ENCOUNTER — Ambulatory Visit
Admission: RE | Admit: 2017-08-07 | Discharge: 2017-08-07 | Disposition: A | Discharge status: Home / Self Care | Payer: Medicare Other | Source: Ambulatory Visit | Attending: Otolaryngology | Admitting: Otolaryngology

## 2017-08-07 DIAGNOSIS — H9192 Unspecified hearing loss, left ear: Secondary | ICD-10-CM

## 2017-08-07 DIAGNOSIS — R51 Headache: Secondary | ICD-10-CM | POA: Insufficient documentation

## 2017-08-07 DIAGNOSIS — R42 Dizziness and giddiness: Secondary | ICD-10-CM | POA: Insufficient documentation

## 2017-08-07 DIAGNOSIS — H903 Sensorineural hearing loss, bilateral: Secondary | ICD-10-CM | POA: Diagnosis not present

## 2017-08-07 DIAGNOSIS — R519 Headache, unspecified: Secondary | ICD-10-CM

## 2017-08-07 LAB — POCT I-STAT CREATININE: CREATININE: 0.8 mg/dL (ref 0.44–1.00)

## 2017-08-07 MED ORDER — GADOBENATE DIMEGLUMINE 529 MG/ML IV SOLN
15.0000 mL | Freq: Once | INTRAVENOUS | Status: AC | PRN
  Administered 2017-08-07: 14 mL via INTRAVENOUS

## 2017-09-18 ENCOUNTER — Telehealth: Payer: Self-pay

## 2017-09-18 NOTE — Telephone Encounter (Signed)
pt states she needs a refill on her trazodone 50mg  she will need a weeks worth sent to [facility] and then a 90 day supply [facility]

## 2017-09-23 ENCOUNTER — Other Ambulatory Visit: Payer: Self-pay | Admitting: Psychiatry

## 2017-09-23 MED ORDER — TRAZODONE HCL 50 MG PO TABS: 50.0000 mg | ORAL_TABLET | Freq: Every day | ORAL | 1 refills | Status: DC

## 2017-09-23 NOTE — Telephone Encounter (Signed)
faxed and confirmed rx for trazodone  ID# [id number] order  # [id number]

## 2017-09-23 NOTE — Telephone Encounter (Signed)
Ok printed the prescription.

## 2017-10-08 ENCOUNTER — Telehealth: Payer: Self-pay

## 2017-10-08 NOTE — Telephone Encounter (Signed)
  received a fax requesting a refill on duloxetine hcl 60mg   Pt was last seen on 07-22-17 next appt  10-14-17  DULoxetine (CYMBALTA) 60 MG capsule  Medication  Date: 07/22/2017 Department: [facility] Ordering/Authorizing: Elvin So, MD  Order Providers   Prescribing Provider Encounter Provider  Elvin So, MD Elvin So, MD  Outpatient Medication Detail    Disp Refills Start End   DULoxetine (CYMBALTA) 60 MG capsule 90 capsule 1 07/22/2017    Sig - Route: Take 1 capsule (60 mg total) by mouth daily. - Oral   Class: Print

## 2017-10-09 NOTE — Telephone Encounter (Signed)
Patient was given a 90 day supply and 1 refill on it, she should be good.

## 2017-10-14 ENCOUNTER — Encounter: Payer: Self-pay | Admitting: Psychiatry

## 2017-10-14 ENCOUNTER — Other Ambulatory Visit: Payer: Self-pay

## 2017-10-14 ENCOUNTER — Ambulatory Visit: Payer: Medicare Other | Admitting: Psychiatry

## 2017-10-14 VITALS — BP 125/82 | HR 67 | Temp 98.1°F

## 2017-10-14 DIAGNOSIS — F332 Major depressive disorder, recurrent severe without psychotic features: Secondary | ICD-10-CM

## 2017-10-14 DIAGNOSIS — F5101 Primary insomnia: Secondary | ICD-10-CM

## 2017-10-14 MED ORDER — DULOXETINE HCL 60 MG PO CPEP: 60.0000 mg | ORAL_CAPSULE | Freq: Every day | ORAL | 1 refills | Status: DC

## 2017-10-14 MED ORDER — BUPROPION HCL ER (SR) 200 MG PO TB12: 200.0000 mg | ORAL_TABLET | Freq: Every morning | ORAL | 1 refills | Status: DC

## 2017-10-14 NOTE — Progress Notes (Signed)
Patient ID: Lori Wagner, female   DOB: Aug 04, 1950, 67 y.o.   MRN: [id number]   [facility] MD/PA/NP OP Progress Note  10/14/2017 9:15 AM Lori Wagner  MRN:  [id number]  Subjective:  Patient returns for follow-up of her major depressive disorder, recurrent in remission  and panic disorder. Patient reports that she is doing well and very busy. 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 States it keeps her occupied and she also needs the money since they are not financially able to support their retirement. Fair sleep and appetite.    Chief Complaint: doing well   Chief Complaint    Follow-up; Medication Refill     Visit Diagnosis:     ICD-10-CM   1. Major depressive disorder, recurrent, severe without psychotic features ([place]) F33.2   2. 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 Anxiety   . Arthritis    knees,hip , back and hands  . Chronic kidney disease    Hx of kidney stones, years ago  . Coronary artery disease   . Dysrhythmia    SVT- controlled with Beta blocker  . Fatigue   . HOH (hard of hearing)    tinnitis both ears  . Hypertension     can cause lightheadedness  . Motion sickness   . Neuromuscular disorder ([place])    carpal tunnel both hands  . PONV (postoperative nausea and vomiting)   . Post herpetic neuralgia    left side of face  . RA (rheumatoid arthritis) ([place])   . Shingles 02/2017   left side of face  . Sleep apnea    Hx of/ when overweight, no CPAP  . Thyroid disease    HX OF/ NO LONGER TAKING THYROID MEDS, THYROID TESTS CAME BACK NORMAL  . Vertigo    Hx of    Past Surgical History:  Procedure Laterality Date  . ANKLE SURGERY Left   . APPENDECTOMY    . BLADDER REPAIR    . CARDIAC CATHETERIZATION    . CARDIAC SURGERY    . CATARACT EXTRACTION W/PHACO Left 06/03/2017   Procedure: CATARACT EXTRACTION PHACO AND INTRAOCULAR LENS PLACEMENT ([place])  LEFT;  Surgeon: Eulogio Bear, MD;  Location: [place];  Service: Ophthalmology;  Laterality: Left;  . CATARACT EXTRACTION W/PHACO Right 07/08/2017   Procedure: CATARACT EXTRACTION PHACO AND INTRAOCULAR LENS PLACEMENT ([place]) RIGHT;  Surgeon: Eulogio Bear, MD;  Location: [place];  Service: Ophthalmology;  Laterality: Right;  . COLONOSCOPY N/A 01/08/2017   Procedure: COLONOSCOPY;  Surgeon: [name], MD;  Location: [facility] END[facility]COPY;  Service: Gastroenterology;  Laterality: N/A;  . CORONARY ARTERY BYPASS GRAFT  2001   x4  . GASTRIC BYPASS     SLEEVE  . HAND SURGERY Right    2 TRIGGER FINGERS  . KNEE ARTHROSCOPY Right   . renal colic    . TRIGGER FINGER RELEASE Right 01/17/2016   Procedure: RELEASE TRIGGER FINGER/A-1 PULLEY THUMB;  Surgeon: Corky Mull, MD;  Location: [place];  Service: Orthopedics;  Laterality: Right;   Family History:  Family History  Problem Relation Age of Onset  . Hypertension Mother   . Coronary artery disease Mother   . Arthritis Mother   . Osteoporosis Mother   . Depression Mother   . Anxiety disorder Mother   . Coronary artery disease Father   . Stroke Father   . Coronary  artery disease Brother   . Bipolar disorder Brother   . Drug abuse Brother   . Coronary artery disease Maternal Aunt   . Osteoporosis Maternal Aunt   . Coronary artery disease Cousin   . Osteoporosis Cousin   . Anxiety disorder Daughter   . Depression Daughter   . Breast cancer Neg Hx    Social History:  Social History   Socioeconomic History  . Marital status: Married    Spouse name: Iona Beard  . Number of children: 1  . Years of education: Not on file  . Highest education level: Bachelor's degree (e.g., BA, AB, BS)  Occupational History    Comment: retired  Scientific laboratory technician  . Financial resource strain: Not very hard  . Food insecurity:    Worry: Never true    Inability: Never true  . Transportation needs:    Medical: No    Non-medical: No   Tobacco Use  . Smoking status: Never Smoker  . Smokeless tobacco: Never Used  Substance and Sexual Activity  . Alcohol use: No  . Drug use: No  . Sexual activity: Never  Lifestyle  . Physical activity:    Days per week: 0 days    Minutes per session: 0 min  . Stress: To some extent  Relationships  . Social connections:    Talks on phone: More than three times a week    Gets together: Once a week    Attends religious service: Never    Active member of club or organization: Yes    Attends meetings of clubs or organizations: More than 4 times per year    Relationship status: Married  Other Topics Concern  . Not on file  Social History Narrative  . Not on file   Additional History:   Assessment:   Musculoskeletal: Strength & Muscle Tone: within normal limits Gait & Station: normal Patient leans: N/A  Psychiatric Specialty Exam: Medication Refill   Depression         Associated symptoms include insomnia.  Associated symptoms include no suicidal ideas.   Review of Systems  Psychiatric/Behavioral: Negative for depression (at baseline and reporting more good days than bad), hallucinations, memory loss, substance abuse and suicidal ideas. The patient has insomnia. The patient is not nervous/anxious.   All other systems reviewed and are negative.   Blood pressure 125/82, pulse 67, temperature 98.1 F (36.7 C), temperature source Oral.There is no height or weight on file to calculate BMI.  General Appearance: Well Groomed  Eye Contact:  Good  Speech:  Normal Rate  Volume:  Normal  Mood:  ok  Affect:  bright  Thought Process:  Linear and Logical  Orientation:  Full (Time, Place, and Person)  Thought Content:  Negative  Suicidal Thoughts:  No  Homicidal Thoughts:  No  Memory:  Immediate;   Good Recent;   Good Remote;   Good  Judgement:  Good  Insight:  Good  Psychomotor Activity:  Negative  Concentration:  Good  Recall:  Good  Fund of Knowledge: Good  Language:  Good  Akathisia:  Negative  Handed:  Right  AIMS (if indicated):  N/A  Assets:  Communication Skills Desire for Improvement Social Support  ADL's:  Intact  Cognition: WNL  Sleep:  good    Is the patient at risk to self?  No. Has the patient been a risk to self in the past 6 months?  No. Has the patient been a risk to self within the distant past?  No. Is the patient a risk to others?  No. Has the patient been a risk to others in the past 6 months?  No. Has the patient been a risk to others within the distant past?  No.  Current Medications: Current Outpatient Medications  Medication Sig Dispense Refill  . aspirin 81 MG tablet Take 162 mg by mouth daily. 2 tablets daily /am    . atorvastatin (LIPITOR) 80 MG tablet Take 80 mg by mouth at bedtime.     . bisoprolol (ZEBETA) 5 MG tablet Take 2.5 mg by mouth daily.     Marland Kitchen buPROPion (WELLBUTRIN SR) 200 MG 12 hr tablet Take 1 tablet (200 mg total) by mouth every morning. 90 tablet 1  . digoxin (LANOXIN) 0.125 MG tablet Take 0.125 mg by mouth daily.     . DULoxetine (CYMBALTA) 60 MG capsule Take 1 capsule (60 mg total) by mouth daily. 90 capsule 1  . gabapentin (NEURONTIN) 300 MG capsule Take 300 mg by mouth at bedtime.     . isosorbide mononitrate (IMDUR) 30 MG 24 hr tablet Take 30 mg by mouth daily. am    . nitroGLYCERIN (NITROSTAT) 0.4 MG SL tablet Place 0.4 mg under the tongue every 5 (five) minutes as needed for chest pain.    . Polyethylene Glycol 3350 (MIRALAX PO) Take by mouth daily as needed.    . traZODone (DESYREL) 50 MG tablet Take 1 tablet (50 mg total) by mouth at bedtime. 90 tablet 1  . valACYclovir (VALTREX) 1000 MG tablet Take by mouth.     No current facility-administered medications for this visit.     Medical Decision Making:  Established Problem, Stable/Improving (1), Review of Medication Regimen & Side Effects (2) and Review of New Medication or Change in Dosage (2)  Treatment Plan Summary:Medication management and  Plan   Major depressive Disorder, recurrent in remission  Continue bupropion at 200mg  po qd.  Continue cymbalta at 60mg  once daily.   Insomnia- Continue trazodone at 50 mg at bedtime.  Patient will follow up with her primary care physician.  Patient `s PCP comfortable with prescribing her medications and she would like to minimize her copays. Patient to call before with any questions or if she needs a consult in the future.   Loreda Silverio 10/14/2017, 9:15 AM

## 2017-10-16 ENCOUNTER — Ambulatory Visit: Payer: Medicare Other | Admitting: Psychiatry

## 2017-11-03 ENCOUNTER — Emergency Department
Admission: EM | Admit: 2017-11-03 | Discharge: 2017-11-03 | Disposition: A | Discharge status: Home / Self Care | Payer: Medicare Other | Attending: Emergency Medicine | Admitting: Emergency Medicine

## 2017-11-03 ENCOUNTER — Emergency Department: Payer: Medicare Other

## 2017-11-03 ENCOUNTER — Other Ambulatory Visit: Payer: Self-pay

## 2017-11-03 DIAGNOSIS — E039 Hypothyroidism, unspecified: Secondary | ICD-10-CM | POA: Insufficient documentation

## 2017-11-03 DIAGNOSIS — Z7982 Long term (current) use of aspirin: Secondary | ICD-10-CM | POA: Insufficient documentation

## 2017-11-03 DIAGNOSIS — R031 Nonspecific low blood-pressure reading: Secondary | ICD-10-CM | POA: Diagnosis not present

## 2017-11-03 DIAGNOSIS — Z79899 Other long term (current) drug therapy: Secondary | ICD-10-CM | POA: Insufficient documentation

## 2017-11-03 DIAGNOSIS — I2510 Atherosclerotic heart disease of native coronary artery without angina pectoris: Secondary | ICD-10-CM | POA: Diagnosis not present

## 2017-11-03 DIAGNOSIS — R42 Dizziness and giddiness: Secondary | ICD-10-CM | POA: Diagnosis present

## 2017-11-03 LAB — URINALYSIS, COMPLETE (UACMP) WITH MICROSCOPIC
BACTERIA UA: NONE SEEN
BILIRUBIN URINE: NEGATIVE
Glucose, UA: NEGATIVE mg/dL
HGB URINE DIPSTICK: NEGATIVE
KETONES UR: NEGATIVE mg/dL
LEUKOCYTES UA: NEGATIVE
NITRITE: NEGATIVE
PROTEIN: NEGATIVE mg/dL
Specific Gravity, Urine: 1.023 (ref 1.005–1.030)
pH: 6 (ref 5.0–8.0)

## 2017-11-03 LAB — CBC
HCT: 38.3 % (ref 35.0–47.0)
HEMOGLOBIN: 13.5 g/dL (ref 12.0–16.0)
MCH: 32.8 pg (ref 26.0–34.0)
MCHC: 35.4 g/dL (ref 32.0–36.0)
MCV: 92.8 fL (ref 80.0–100.0)
Platelets: 214 10*3/uL (ref 150–440)
RBC: 4.12 MIL/uL (ref 3.80–5.20)
RDW: 13.1 % (ref 11.5–14.5)
WBC: 5.2 10*3/uL (ref 3.6–11.0)

## 2017-11-03 LAB — TSH: TSH: 5.801 u[IU]/mL — ABNORMAL HIGH (ref 0.350–4.500)

## 2017-11-03 LAB — BASIC METABOLIC PANEL
ANION GAP: 7 (ref 5–15)
BUN: 15 mg/dL (ref 6–20)
CO2: 28 mmol/L (ref 22–32)
Calcium: 8.6 mg/dL — ABNORMAL LOW (ref 8.9–10.3)
Chloride: 107 mmol/L (ref 101–111)
Creatinine, Ser: 0.86 mg/dL (ref 0.44–1.00)
GFR calc Af Amer: >60 mL/min (ref >60)
GLUCOSE: 96 mg/dL (ref 65–99)
POTASSIUM: 3.8 mmol/L (ref 3.5–5.1)
Sodium: 142 mmol/L (ref 135–145)

## 2017-11-03 LAB — DIGOXIN LEVEL: DIGOXIN LVL: 1.6 ng/mL (ref 0.8–2.0)

## 2017-11-03 NOTE — ED Provider Notes (Addendum)
[facility] Emergency Department Provider Note  ____________________________________________   I have reviewed the triage vital signs and the nursing notes. Where available I have reviewed prior notes and, if possible and indicated, outside hospital notes.    HISTORY  Chief Complaint Dizziness and Nausea    HPI Lori Wagner is a 67 y.o. female who presents today complaining of feeling lightheaded.  She has been recently increased on her propanolol and her digitalis the last couple weeks and since that time she feels lightheaded when she wakes up however, when they go down her digitalis and her propranolol, she has SVT.  She has not had SVT recently.  They have tried ablation without any success.  The patient is no acute distress reading on her phone and would like to go home.  She is sure that she did not have SVT today     Past Medical History:  Diagnosis Date  . Anxiety   . Arthritis    knees,hip , back and hands  . Chronic kidney disease    Hx of kidney stones, years ago  . Coronary artery disease   . Dysrhythmia    SVT- controlled with Beta blocker  . Fatigue   . HOH (hard of hearing)    tinnitis both ears  . Hypertension     can cause lightheadedness  . Motion sickness   . Neuromuscular disorder ([place])    carpal tunnel both hands  . PONV (postoperative nausea and vomiting)   . Post herpetic neuralgia    left side of face  . RA (rheumatoid arthritis) ([place])   . Shingles 02/2017   left side of face  . Sleep apnea    Hx of/ when overweight, no CPAP  . Thyroid disease    HX OF/ NO LONGER TAKING THYROID MEDS, THYROID TESTS CAME BACK NORMAL  . Vertigo    Hx of    Patient Active Problem List   Diagnosis Date Noted  . CAD (coronary artery disease) 07/03/2017  . Hypotension 07/03/2017  . Proctitis 01/07/2017  . Constipation   . LLQ abdominal pain   . Abnormal CT of the abdomen   . Tremor 08/07/2016  . SVT (supraventricular  tachycardia) ([place]) 06/25/2016  . Trigger finger of right thumb 01/18/2016  . Carpal tunnel syndrome 10/13/2015  . Bilateral carpal tunnel syndrome 10/13/2015  . Arthralgia of multiple joints 07/03/2015  . CFIDS (chronic fatigue and immune dysfunction syndrome) ([place]) 07/03/2015  . Degeneration of intervertebral disc of lumbar region 07/03/2015  . Hand paresthesia 07/03/2015  . Major depressive disorder, recurrent, severe without psychotic features ([place]) 12/21/2014  . Panic disorder 12/21/2014  . Impingement syndrome of shoulder 07/22/2014  . Infraspinatus tenosynovitis 07/22/2014  . Impingement syndrome of left shoulder 07/22/2014  . Other synovitis and tenosynovitis, left shoulder 07/22/2014  . Left supraspinatus tenosynovitis 07/22/2014  . Acquired hypothyroidism 07/12/2014  . Pain in shoulder 07/12/2014  . Clinical depression 07/12/2014  . Combined fat and carbohydrate induced hyperlipemia 07/12/2014    Past Surgical History:  Procedure Laterality Date  . ANKLE SURGERY Left   . APPENDECTOMY    . BLADDER REPAIR    . CARDIAC CATHETERIZATION    . CARDIAC SURGERY    . CATARACT EXTRACTION W/PHACO Left 06/03/2017   Procedure: CATARACT EXTRACTION PHACO AND INTRAOCULAR LENS PLACEMENT ([place])  LEFT;  Surgeon: Eulogio Bear, MD;  Location: [place];  Service: Ophthalmology;  Laterality: Left;  . CATARACT EXTRACTION W/PHACO Right 07/08/2017   Procedure:  CATARACT EXTRACTION PHACO AND INTRAOCULAR LENS PLACEMENT (I[place]) RIGHT;  Surgeon: Eulogio Bear, MD;  Location: [facility];  Service: Ophthalmology;  Laterality: Right;  . COLONOSCOPY N/A 01/08/2017   Procedure: COLONOSCOPY;  Surgeon: [name], MD;  Location: [facility] ENDOSCOPY;  Service: Gastroenterology;  Laterality: N/A;  . CORONARY ARTERY BYPASS GRAFT  2001   x4  . GASTRIC BYPASS     SLEEVE  . HAND SURGERY Right    2 TRIGGER FINGERS  . KNEE ARTHROSCOPY Right   . renal colic    . TRIGGER FINGER RELEASE  Right 01/17/2016   Procedure: RELEASE TRIGGER FINGER/A-1 PULLEY THUMB;  Surgeon: Corky Mull, MD;  Location: [place];  Service: Orthopedics;  Laterality: Right;    Prior to Admission medications   Medication Sig Start Date End Date Taking? Authorizing Provider  aspirin 81 MG tablet Take 162 mg by mouth daily. 2 tablets daily /am    [provider]  atorvastatin (LIPITOR) 80 MG tablet Take 80 mg by mouth at bedtime.     [provider]  bisoprolol (ZEBETA) 5 MG tablet Take 2.5 mg by mouth daily.     [provider]  buPROPion (WELLBUTRIN SR) 200 MG 12 hr tablet Take 1 tablet (200 mg total) by mouth every morning. 10/14/17 10/14/18  Elvin So, MD  digoxin (LANOXIN) 0.125 MG tablet Take 0.125 mg by mouth daily.     [provider]  DULoxetine (CYMBALTA) 60 MG capsule Take 1 capsule (60 mg total) by mouth daily. 10/14/17   Elvin So, MD  gabapentin (NEURONTIN) 300 MG capsule Take 300 mg by mouth at bedtime.     [provider]  isosorbide mononitrate (IMDUR) 30 MG 24 hr tablet Take 30 mg by mouth daily. am    [provider]  nitroGLYCERIN (NITROSTAT) 0.4 MG SL tablet Place 0.4 mg under the tongue every 5 (five) minutes as needed for chest pain.    [provider]  Polyethylene Glycol 3350 (MIRALAX PO) Take by mouth daily as needed.    [provider]  traZODone (DESYREL) 50 MG tablet Take 1 tablet (50 mg total) by mouth at bedtime. 09/23/17   Elvin So, MD  valACYclovir (VALTREX) 1000 MG tablet Take by mouth. 03/31/17 03/31/18  [provider]    Allergies Epinephrine; Sulfa antibiotics; Ace inhibitors; Morphine and related; Penicillins; and Cynara scolymus (artichoke)  Family History  Problem Relation Age of Onset  . Hypertension Mother   . Coronary artery disease Mother   . Arthritis Mother   . Osteoporosis Mother   . Depression Mother   . Anxiety disorder Mother   . Coronary artery  disease Father   . Stroke Father   . Coronary artery disease Brother   . Bipolar disorder Brother   . Drug abuse Brother   . Coronary artery disease Maternal Aunt   . Osteoporosis Maternal Aunt   . Coronary artery disease Cousin   . Osteoporosis Cousin   . Anxiety disorder Daughter   . Depression Daughter   . Breast cancer Neg Hx     Social History Social History   Tobacco Use  . Smoking status: Never Smoker  . Smokeless tobacco: Never Used  Substance Use Topics  . Alcohol use: No  . Drug use: No    Review of Systems Constitutional: No fever/chills Eyes: No visual changes. ENT: No sore throat. No stiff neck no neck pain Cardiovascular: Denies chest pain. Respiratory: Denies shortness of breath. Gastrointestinal:  no vomiting.  No diarrhea.  No constipation. Genitourinary: Negative for dysuria. Musculoskeletal: Negative lower extremity swelling Skin: Negative for rash. Neurological: Negative for severe headaches, focal weakness or numbness.   ____________________________________________   PHYSICAL EXAM:  VITAL SIGNS: ED Triage Vitals [11/03/17 0955]  Enc Vitals Group     BP 110/62     Pulse Rate (!) 51     Resp 14     Temp 97.7 F (36.5 C)     Temp Source Oral     SpO2 100 %     Weight 155 lb (70.3 kg)     Height 5\' 5"  (1.651 m)     Head Circumference      Peak Flow      Pain Score 0     Pain Loc      Pain Edu?      Excl. in [place]?     Constitutional: Alert and oriented. Well appearing and in no acute distress. Eyes: Conjunctivae are normal Head: Atraumatic HEENT: No congestion/rhinnorhea. Mucous membranes are moist.  Oropharynx non-erythematous Neck:   Nontender with no meningismus, no masses, no stridor Cardiovascular: Normal rate, regular rhythm. Grossly normal heart sounds.  Good peripheral circulation. Respiratory: Normal respiratory effort.  No retractions. Lungs CTAB. Abdominal: Soft and nontender. No distention. No guarding no rebound Back:   There is no focal tenderness or step off.  there is no midline tenderness there are no lesions noted. there is no CVA tenderness  Musculoskeletal: No lower extremity tenderness, no upper extremity tenderness. No joint effusions, no DVT signs strong distal pulses no edema Neurologic:  Normal speech and language. No gross focal neurologic deficits are appreciated.  Skin:  Skin is warm, dry and intact. No rash noted. Psychiatric: Mood and affect are normal. Speech and behavior are normal.  ____________________________________________   LABS (all labs ordered are listed, but only abnormal results are displayed)  Labs Reviewed  BASIC METABOLIC PANEL - Abnormal; Notable for the following components:      Result Value   Calcium 8.6 (*)    All other components within normal limits  URINALYSIS, COMPLETE (UACMP) WITH MICROSCOPIC - Abnormal; Notable for the following components:   Color, Urine YELLOW (*)    APPearance CLEAR (*)    All other components within normal limits  TSH - Abnormal; Notable for the following components:   TSH 5.801 (*)    All other components within normal limits  CBC  DIGOXIN LEVEL    Pertinent labs  results that were available during my care of the patient were reviewed by me and considered in my medical decision making (see chart for details). ____________________________________________  EKG  I personally interpreted any EKGs ordered by me or triage Sinus rhythm, rate 53 bpm right bundle branch block no acute ST elevation or depression, ____________________________________________  RADIOLOGY  Pertinent labs & imaging results that were available during my care of the patient were reviewed by me and considered in my medical decision making (see chart for details). If possible, patient and/or family made aware of any abnormal findings.  Dg Chest 2 View  Result Date: 11/03/2017 CLINICAL DATA:  Weakness.  Dizziness. EXAM: CHEST - 2 VIEW COMPARISON:  Chest x-ray  dated Oct 05, 2015. FINDINGS: The heart size and mediastinal contours are within normal limits. Prior CABG. Normal pulmonary vascularity. No focal consolidation, pleural effusion, or pneumothorax. No acute osseous abnormality. IMPRESSION: No active cardiopulmonary disease. Electronically Signed   By: Titus Dubin M.D.   On:  11/03/2017 11:37   ____________________________________________    PROCEDURES  Procedure(s) performed: None  Procedures  Critical Care performed: None  ____________________________________________   INITIAL IMPRESSION / ASSESSMENT AND PLAN / ED COURSE  Pertinent labs & imaging results that were available during my care of the patient were reviewed by me and considered in my medical decision making (see chart for details).  Patient here after being lightheaded and having a pressure in the high 90s this morning which she attributes to a routine problem with her blood patient.  She has no other complaints nothing to suggest sepsis or bleeding etc.  I did do a diffuse work-up.  In any event, I did find that she is a little bit hypothyroid, o it is a chronic problem I do not think caused her hypotension today her relative hypotension, and I do not want to increase her thyroxine given her history of SVT.  We will defer to PCP for that she is made aware of the finding.  Digitalis level is normal blood work is otherwise reassuring not anemic no evidence of urinary tract infection blood pressure after 500 cc bolus from EMS was in the normal range and remains there.  She does not feel orthostatic she has been able to ambulate in the department, she is adamant that she wants to go home we will discharge with close outpatient follow-up obviously she will probably need some help with her medications, I did give her extensive return precautions and cautions to observe if she is feeling lightheaded.  ----------------------------------------- 2:31 PM on  11/03/2017 -----------------------------------------  Pt states it is not unusual for her to have pressures in the 90s, has had rbb which is old. I advised drinking h20 when she gets up to urinate at night and precautions given about falling.  Pt in nad eager to go home. This is a chronic problem for her.     ____________________________________________   FINAL CLINICAL IMPRESSION(S) / ED DIAGNOSES  Final diagnoses:  None      This chart was dictated using voice recognition software.  Despite best efforts to proofread,  errors can occur which can change meaning.      Schuyler Amor, MD 11/03/17 1406    Schuyler Amor, MD 11/03/17 [id number]

## 2017-11-03 NOTE — ED Triage Notes (Signed)
Pt arrived via [facility] from [facility] with c/o dizziness and nausea. EMS states her bp was 99/64 and pulse was low 50s. Pt states she was feeling dizzy and her bp is normally low. Pt states she has lost weight and as a result bp dropped.

## 2017-11-03 NOTE — Discharge Instructions (Addendum)
Drink plenty of fluids, follow closely with a primary care doctor we do notice that you are a little bit hypothyroid and this will need to be adjusted by her primary care doctor.  If you feel lightheaded, be very careful about standing up quickly especially at night.  If you have any chest pain shortness of breath nausea vomiting diarrhea abdominal pain bleeding or fever, or you feel worse in any way including SVT, please return to the emergency department.  Your primary care doctor may wish to diminish some of your blood pressure medications, as since they were elevated 3 weeks ago, your blood pressure has been running somewhat low.  However, at this time the blood work and other vital signs are reassuring and we advised that you follow closely with primary care, cardiology, electrophysiology.

## 2018-05-15 ENCOUNTER — Other Ambulatory Visit: Payer: Self-pay | Admitting: Family Medicine

## 2018-05-15 DIAGNOSIS — Z1231 Encounter for screening mammogram for malignant neoplasm of breast: Secondary | ICD-10-CM

## 2018-06-16 ENCOUNTER — Ambulatory Visit
Admission: RE | Admit: 2018-06-16 | Discharge: 2018-06-16 | Disposition: A | Discharge status: Home / Self Care | Payer: Medicare Other | Source: Ambulatory Visit | Attending: Family Medicine | Admitting: Family Medicine

## 2018-06-16 DIAGNOSIS — Z1231 Encounter for screening mammogram for malignant neoplasm of breast: Secondary | ICD-10-CM | POA: Insufficient documentation

## 2018-09-15 ENCOUNTER — Telehealth: Payer: Self-pay | Admitting: Licensed Clinical Social Worker

## 2018-09-15 NOTE — Telephone Encounter (Signed)
Called patient to confirm that she was planning to come to April 30th genetics appointment. She wanted to reschedule. Offered her the option to be seen in [place] in [place]/July, she declined and preferred to be rescheduled to be seen at [facility] on Aug 27th.

## 2018-09-24 ENCOUNTER — Inpatient Hospital Stay: Payer: Medicare Other | Admitting: Licensed Clinical Social Worker

## 2018-11-05 ENCOUNTER — Encounter: Payer: Self-pay | Admitting: Emergency Medicine

## 2018-11-05 ENCOUNTER — Other Ambulatory Visit: Payer: Self-pay

## 2018-11-05 ENCOUNTER — Emergency Department
Admission: EM | Admit: 2018-11-05 | Discharge: 2018-11-05 | Disposition: A | Discharge status: Home / Self Care | Payer: Medicare Other | Attending: Emergency Medicine | Admitting: Emergency Medicine

## 2018-11-05 DIAGNOSIS — G8918 Other acute postprocedural pain: Secondary | ICD-10-CM | POA: Diagnosis present

## 2018-11-05 DIAGNOSIS — Z5321 Procedure and treatment not carried out due to patient leaving prior to being seen by health care provider: Secondary | ICD-10-CM | POA: Diagnosis not present

## 2018-11-05 NOTE — ED Triage Notes (Signed)
Pt to triage via w/c with no distress noted, mask in place; st yesterday had left hammer toe surgery (dressing/cast shoe in place D&I) and having postop pain unrelieved by tylenol and "oxy"

## 2019-01-18 ENCOUNTER — Telehealth: Payer: Self-pay | Admitting: Licensed Clinical Social Worker

## 2019-01-19 NOTE — Telephone Encounter (Signed)
Called patient to confirm her appointment with me on Thursday 8/27 at 11 am. She will be coming and would like this to be a walk-in visit.

## 2019-01-20 ENCOUNTER — Other Ambulatory Visit: Payer: Self-pay

## 2019-01-21 ENCOUNTER — Encounter: Payer: Self-pay | Admitting: Licensed Clinical Social Worker

## 2019-01-21 ENCOUNTER — Inpatient Hospital Stay: Payer: Medicare Other | Attending: Oncology | Admitting: Licensed Clinical Social Worker

## 2019-01-21 ENCOUNTER — Inpatient Hospital Stay: Payer: Medicare Other

## 2019-01-21 ENCOUNTER — Other Ambulatory Visit: Payer: Self-pay

## 2019-01-21 DIAGNOSIS — Z8041 Family history of malignant neoplasm of ovary: Secondary | ICD-10-CM

## 2019-01-21 DIAGNOSIS — Z801 Family history of malignant neoplasm of trachea, bronchus and lung: Secondary | ICD-10-CM

## 2019-01-21 DIAGNOSIS — Z808 Family history of malignant neoplasm of other organs or systems: Secondary | ICD-10-CM

## 2019-01-21 NOTE — Progress Notes (Addendum)
REFERRING PROVIDER: Wayland Denis, PA-C [address] [place],  [place] [address]  PRIMARY PROVIDER:  [name], MD  PRIMARY REASON FOR VISIT:  1. Family history of ovarian cancer   2. Family history of bone cancer   3. Family history of lung cancer      HISTORY OF PRESENT ILLNESS:   [name], a 68 y.o. female, was seen for a [place] cancer genetics consultation at the request of Wayland Denis, PA-C due to a family history of cancer, and her cousin's test result that revealed a CHEK2 mutation.  [name] presents to clinic today to discuss the possibility of a hereditary predisposition to cancer, genetic testing, and to further clarify her future cancer risks, as well as potential cancer risks for family members.   [name] is a 68 y.o. female with no personal history of cancer.    CANCER HISTORY:  Oncology History   No history exists.    Past Medical History:  Diagnosis Date  . Anxiety   . Arthritis    knees,hip , back and hands  . Chronic kidney disease    Hx of kidney stones, years ago  . Coronary artery disease   . Dysrhythmia    SVT- controlled with Beta blocker  . Family history of bone cancer   . Family history of lung cancer   . Family history of ovarian cancer   . Fatigue   . HOH (hard of hearing)    tinnitis both ears  . Hypertension     can cause lightheadedness  . Motion sickness   . Neuromuscular disorder ([place])    carpal tunnel both hands  . PONV (postoperative nausea and vomiting)   . Post herpetic neuralgia    left side of face  . RA (rheumatoid arthritis) ([place])   . Shingles 02/2017   left side of face  . Sleep apnea    Hx of/ when overweight, no CPAP  . Thyroid disease    HX OF/ NO LONGER TAKING THYROID MEDS, THYROID TESTS CAME BACK NORMAL  . Vertigo    Hx of    Past Surgical History:  Procedure Laterality Date  . ANKLE SURGERY Left   . APPENDECTOMY    . BLADDER REPAIR    .  CARDIAC CATHETERIZATION    . CARDIAC SURGERY    . CATARACT EXTRACTION W/PHACO Left 06/03/2017   Procedure: CATARACT EXTRACTION PHACO AND INTRAOCULAR LENS PLACEMENT ([place])  LEFT;  Surgeon: Eulogio Bear, MD;  Location: [place];  Service: Ophthalmology;  Laterality: Left;  . CATARACT EXTRACTION W/PHACO Right 07/08/2017   Procedure: CATARACT EXTRACTION PHACO AND INTRAOCULAR LENS PLACEMENT ([place]) RIGHT;  Surgeon: Eulogio Bear, MD;  Location: [place];  Service: Ophthalmology;  Laterality: Right;  . COLONOSCOPY N/A 01/08/2017   Procedure: COLONOSCOPY;  Surgeon: [name], MD;  Location: [facility] ENDOSCOPY;  Service: Gastroenterology;  Laterality: N/A;  . CORONARY ARTERY BYPASS GRAFT  2001   x4  . GASTRIC BYPASS     SLEEVE  . HAND SURGERY Right    2 TRIGGER FINGERS  . KNEE ARTHROSCOPY Right   . renal colic    . TRIGGER FINGER RELEASE Right 01/17/2016   Procedure: RELEASE TRIGGER FINGER/A-1 PULLEY THUMB;  Surgeon: Corky Mull, MD;  Location: [facility];  Service: Orthopedics;  Laterality: Right;    Social History   Socioeconomic History  . Marital status: Married    Spouse name: Iona Beard  .  Number of children: 1  . Years of education: Not on file  . Highest education level: Bachelor's degree (e.g., BA, AB, BS)  Occupational History    Comment: retired  Scientific laboratory technician  . Financial resource strain: Not very hard  . Food insecurity    Worry: Never true    Inability: Never true  . Transportation needs    Medical: No    Non-medical: No  Tobacco Use  . Smoking status: Never Smoker  . Smokeless tobacco: Never Used  Substance and Sexual Activity  . Alcohol use: No  . Drug use: No  . Sexual activity: Never  Lifestyle  . Physical activity    Days per week: 0 days    Minutes per session: 0 min  . Stress: To some extent  Relationships  . Social connections    Talks on phone: More than three times a week    Gets together: Once a week    Attends  religious service: Never    Active member of club or organization: Yes    Attends meetings of clubs or organizations: More than 4 times per year    Relationship status: Married  Other Topics Concern  . Not on file  Social History Narrative  . Not on file     FAMILY HISTORY:  We obtained a detailed, 4-generation family history.  Significant diagnoses are listed below: Family History  Problem Relation Age of Onset  . Hypertension Mother   . Coronary artery disease Mother   . Arthritis Mother   . Osteoporosis Mother   . Depression Mother   . Anxiety disorder Mother   . Coronary artery disease Father   . Stroke Father   . Coronary artery disease Brother   . Bipolar disorder Brother   . Drug abuse Brother   . Coronary artery disease Maternal Aunt   . Osteoporosis Maternal Aunt   . Coronary artery disease Cousin   . Osteoporosis Cousin   . Anxiety disorder Daughter   . Depression Daughter   . Bone cancer Paternal Uncle   . Lung cancer Paternal Grandmother   . Ovarian cancer Cousin 70       CHEK2+  . Breast cancer Neg Hx     [name] has one daughter, Loma Sender, 74, no cancers. She had 2 brothers. One brother died at 68, and the other is living at 32 with no history of cancer.  Ms. Audrea Muscat Siple's father died at 29, no cancers. Patient had 1 paternal uncle who had bone cancer and died in his 27s. This uncle had 4 daughters. One daughter was diagnosed with ovarian cancer at 53 and had genetic testing that revealed a CHEK2 mutation. Patient's paternal grandmother had lung and brain cancer and died in her 78s, patient has no information about paternal grandfather.  Ms. Audrea Muscat Siple's mother died at 61, she had history of colon polyps, no cancers. Patient had 1 maternal aunt who did have cancer but she is unsure the type, died in her 84s. No cancer for her maternal cousin. Her maternal grandparents both died in their 38s due to heart issues.   [name] is aware of  previous family history of genetic testing for hereditary cancer risks. Patient's maternal ancestors are of European descent, and paternal ancestors are of [place] descent. There is no reported Ashkenazi Jewish ancestry. There is no known consanguinity.  GENETIC COUNSELING ASSESSMENT: [name] is a 68 y.o. female with a family history of a CHEK2 mutation.  We, therefore, discussed and recommended the following at today's visit.   DISCUSSION: We discussed that about 5-10% of cancer in general is hereditary. We discussed the CHEK2 gene in detail, noting cancers associated such as breast, colon, prostate and thyroid, and management guidelines. There are other genes that can be associated with hereditary cancer syndromes. We discussed that testing is beneficial for several reasons including  knowing how to follow individuals for cancer screenings and understand if other family members could be at risk for cancer and allow them to undergo genetic testing.   We reviewed the characteristics, features and inheritance patterns of hereditary cancer syndromes. We also discussed genetic testing, including the appropriate family members to test, the process of testing, insurance coverage and turn-around-time for results. We discussed the implications of a negative, positive and/or variant of uncertain significant result. We recommended Ms. Talvitie Siple pursue genetic testing for the Common Hereditary Cancers gene panel.   The Common Hereditary Cancers Panel offered by [facility] includes sequencing and/or deletion duplication testing of the following 48 genes: APC, ATM, AXIN2, BARD1, BMPR1A, BRCA1, BRCA2, BRIP1, CDH1, CDKN2A (p14ARF), CDKN2A (p16INK4a), CKD4, CHEK2, CTNNA1, DICER1, EPCAM (Deletion/duplication testing only), GREM1 (promoter region deletion/duplication testing only), KIT, MEN1, MLH1, MSH2, MSH3, MSH6, MUTYH, NBN, NF1, NHTL1, PALB2, PDGFRA, PMS2, POLD1, POLE, PTEN, RAD50, RAD51C, RAD51D, RNF43,  SDHB, SDHC, SDHD, SMAD4, SMARCA4. STK11, TP53, TSC1, TSC2, and VHL.  The following genes were evaluated for sequence changes only: SDHA and HOXB13 c.251G>A variant only.  Based on Ms. Talvitie Siple's family history of CHEK2 mutation, she meets medical criteria for genetic testing. Despite that she meets criteria, she may still have an out of pocket cost.   PLAN: After considering the risks, benefits, and limitations, Ms. Talvitie Siple provided informed consent to pursue genetic testing and the blood sample was sent to [facility] for analysis of the Common Hereditary Cancers Panel. Results should be available within approximately 2-3 weeks' time, at which point they will be disclosed by telephone to Ms. Talvitie Siple, as will any additional recommendations warranted by these results. [name] will receive a summary of her genetic counseling visit and a copy of her results once available. This information will also be available in Epic.   Ms. Audrea Muscat Siple's questions were answered to her satisfaction today. Our contact information was provided should additional questions or concerns arise. Thank you for the referral and allowing [place] to share in the care of your patient.   [name], MS, [facility] Genetic Counselor [email or web address] Phone: [phone]  The patient was seen for a total of 30 minutes in face-to-face genetic counseling.  Dr. Grayland Ormond was available for discussion regarding this case.   _______________________________________________________________________ For Office Staff:  Number of people involved in session: 1 Was an Intern/ student involved with case: no

## 2019-01-29 ENCOUNTER — Telehealth: Payer: Self-pay | Admitting: Licensed Clinical Social Worker

## 2019-02-02 ENCOUNTER — Ambulatory Visit: Payer: Self-pay | Admitting: Licensed Clinical Social Worker

## 2019-02-02 ENCOUNTER — Encounter: Payer: Self-pay | Admitting: Licensed Clinical Social Worker

## 2019-02-02 DIAGNOSIS — Z8041 Family history of malignant neoplasm of ovary: Secondary | ICD-10-CM

## 2019-02-02 DIAGNOSIS — Z808 Family history of malignant neoplasm of other organs or systems: Secondary | ICD-10-CM

## 2019-02-02 DIAGNOSIS — Z801 Family history of malignant neoplasm of trachea, bronchus and lung: Secondary | ICD-10-CM

## 2019-02-02 DIAGNOSIS — Z1379 Encounter for other screening for genetic and chromosomal anomalies: Secondary | ICD-10-CM

## 2019-02-02 NOTE — Progress Notes (Addendum)
HPI:  [name] was previously seen in the [place] clinic due to a family history of cancer, family history of CHEK2 mutation, and concerns regarding a hereditary predisposition to cancer. Please refer to our prior cancer genetics clinic note for more information regarding our discussion, assessment and recommendations, at the time. [name]'s recent genetic test results were disclosed to her, as were recommendations warranted by these results. These results and recommendations are discussed in more detail below.  CANCER HISTORY:  Oncology History   No history exists.    FAMILY HISTORY:  We obtained a detailed, 4-generation family history.  Significant diagnoses are listed below: Family History  Problem Relation Age of Onset  . Hypertension Mother   . Coronary artery disease Mother   . Arthritis Mother   . Osteoporosis Mother   . Depression Mother   . Anxiety disorder Mother   . Coronary artery disease Father   . Stroke Father   . Coronary artery disease Brother   . Bipolar disorder Brother   . Drug abuse Brother   . Coronary artery disease Maternal Aunt   . Osteoporosis Maternal Aunt   . Coronary artery disease Cousin   . Osteoporosis Cousin   . Anxiety disorder Daughter   . Depression Daughter   . Bone cancer Paternal Uncle   . Lung cancer Paternal Grandmother   . Ovarian cancer Cousin 70       CHEK2+  . Breast cancer Neg Hx    [name] has one daughter, Lori Wagner, 54, no cancers. She had 2 brothers. One brother died at 65, and the other is living at 42 with no history of cancer.  [name]'s father died at 3, no cancers. Patient had 1 paternal uncle who had bone cancer and died in his 36s. This uncle had 4 daughters. One daughter was diagnosed with ovarian cancer at 78 and had genetic testing that revealed a CHEK2 mutation. Patient's paternal grandmother had lung and brain cancer and died in her 52s, patient has no  information about paternal grandfather.  [name]'s mother died at 26, she had history of colon polyps, no cancers. Patient had 1 maternal aunt who did have cancer but she is unsure the type, died in her 19s. No cancer for her maternal cousin. Her maternal grandparents both died in their 92s due to heart issues.   [name] is aware of previous family history of genetic testing for hereditary cancer risks. Patient's maternal ancestors are of European descent, and paternal ancestors are of [place] descent. There is no reported Ashkenazi Jewish ancestry. There is no known consanguinity.  GENETIC TEST RESULTS: Genetic testing reported out on 01/27/2019 through the [facility] Common Hereditary cancer panel found no pathogenic mutations. The Common Hereditary Cancers Panel offered by [facility] includes sequencing and/or deletion duplication testing of the following 48 genes: APC, ATM, AXIN2, BARD1, BMPR1A, BRCA1, BRCA2, BRIP1, CDH1, CDKN2A (p14ARF), CDKN2A (p16INK4a), CKD4, CHEK2, CTNNA1, DICER1, EPCAM (Deletion/duplication testing only), GREM1 (promoter region deletion/duplication testing only), KIT, MEN1, MLH1, MSH2, MSH3, MSH6, MUTYH, NBN, NF1, NHTL1, PALB2, PDGFRA, PMS2, POLD1, POLE, PTEN, RAD50, RAD51C, RAD51D, RNF43, SDHB, SDHC, SDHD, SMAD4, SMARCA4. STK11, TP53, TSC1, TSC2, and VHL.  The following genes were evaluated for sequence changes only: SDHA and HOXB13 c.251G>A variant only. The test report has been scanned into EPIC and is located under the Molecular Pathology section of the Results Review tab.  A portion of the result report is included below  for reference.     We discussed with [name] that because current genetic testing is not perfect, it is possible there may be a gene mutation in one of these genes that current testing cannot detect, but that chance is small.  We also discussed, that there could be another gene that has not yet been discovered, or that we have not  yet tested, that is responsible for the cancer diagnoses in the family. It is also possible there is a hereditary cause for the cancer in the family that [name] did not inherit and therefore was not identified in her testing.  Therefore, it is important to remain in touch with cancer genetics in the future so that we can continue to offer [name] the most up to date genetic testing.  ADDITIONAL GENETIC TESTING: We discussed with [name] that her genetic testing was fairly extensive.  If there are genes identified to increase cancer risk that can be analyzed in the future, we would be happy to discuss and coordinate this testing at that time.    CANCER SCREENING RECOMMENDATIONS: [name]'s test result is considered negative (normal).  This means that we have not identified a hereditary cause for her  family history of cancer at this time, and we also have not identified a mutation in [place] which she reports is in the family. We did discuss the possibility of reviewing her family member's genetic test report, but she feels it is unlikely she will be able to obtain a copy.  While reassuring, this does not definitively rule out a hereditary predisposition to cancer. It is still possible that there could be genetic mutations that are undetectable by current technology. There could be genetic mutations in genes that have not been tested or identified to increase cancer risk.  Therefore, it is recommended she continue to follow the cancer management and screening guidelines provided by her  primary healthcare provider.   An individual's cancer risk and medical management are not determined by genetic test results alone. Overall cancer risk assessment incorporates additional factors, including personal medical history, family history, and any available genetic information that may result in a personalized plan for cancer prevention and surveillance  RECOMMENDATIONS FOR  FAMILY MEMBERS:  Relatives in this family might be at some increased risk of developing cancer, over the general population risk, simply due to the family history of cancer.  We recommended female relatives in this family have a yearly mammogram beginning at age 65, or 89 years younger than the earliest onset of cancer, an annual clinical breast exam, and perform monthly breast self-exams. Female relatives in this family should also have a gynecological exam as recommended by their primary provider. All family members should have a colonoscopy by age 2, or as directed by their physicians.  Based on [name]'s family history, we recommended her paternal relatives  have genetic counseling and testing. [name] will let us know if we can be of any assistance in coordinating genetic counseling and/or testing for these family members.  FOLLOW-UP: Lastly, we discussed with [name] that cancer genetics is a rapidly advancing field and it is possible that new genetic tests will be appropriate for her and/or her family members in the future. We encouraged her to remain in contact with cancer genetics on an annual basis so we can update her personal and family histories and let her know of advances in cancer genetics that may  benefit this family.   Our contact number was provided. [name]'s questions were answered to her satisfaction, and she knows she is welcome to call us at anytime with additional questions or concerns.   [name], MS, [facility] Genetic Counselor [email or web address] Phone: [phone]

## 2019-02-02 NOTE — Telephone Encounter (Signed)
Revealed negative genetic testing.  This normal result is reassuring and indicates that it is unlikely [name]'s cancer is due to a hereditary cause.  It is unlikely that there is an increased risk of another cancer due to a mutation in one of these genes.  However, genetic testing is not perfect, and cannot definitively rule out a hereditary cause.  It will be important for her to keep in contact with genetics to learn if any additional testing may be needed in the future.

## 2019-05-10 ENCOUNTER — Other Ambulatory Visit: Payer: Self-pay | Admitting: Family Medicine

## 2019-05-10 DIAGNOSIS — Z1231 Encounter for screening mammogram for malignant neoplasm of breast: Secondary | ICD-10-CM

## 2019-06-10 IMAGING — MG MM DIGITAL SCREENING BILAT W/ TOMO W/ CAD
8 of 12 series · 8 of 28 positions shown · non-contrast
Comparison: Previous exam(s).

CLINICAL DATA: Screening.

EXAM:
2D DIGITAL SCREENING BILATERAL MAMMOGRAM WITH 3D TOMO WITH CAD

[R CC]
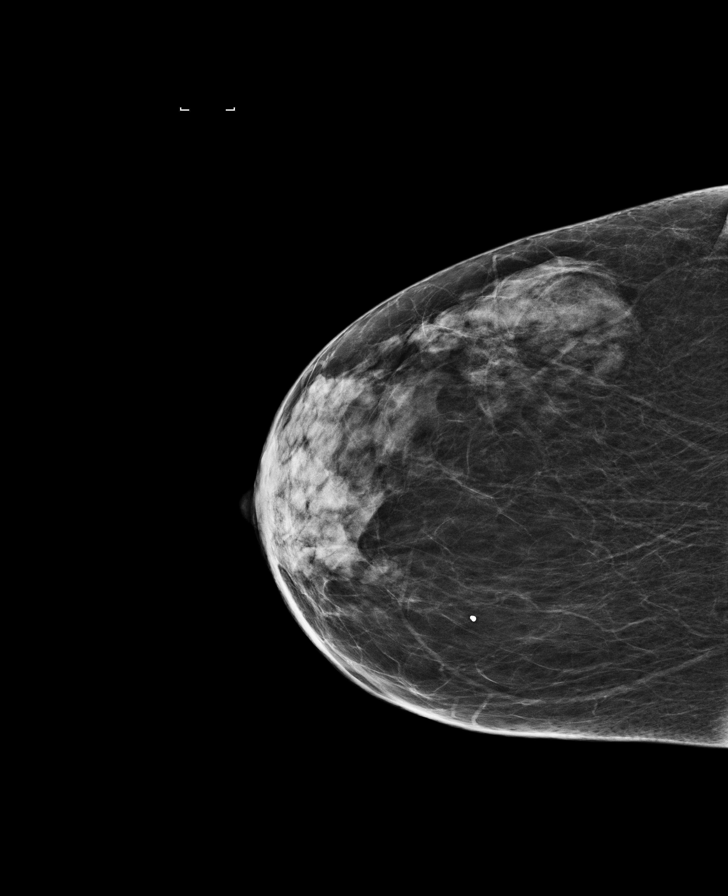

[L MLO synth-2D]
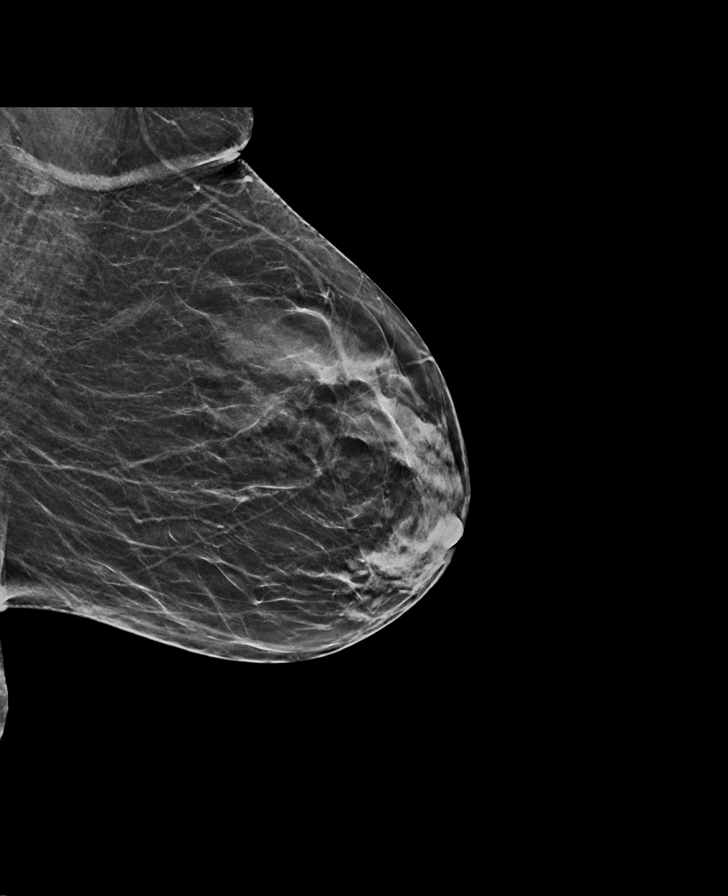

[R CC synth-2D]
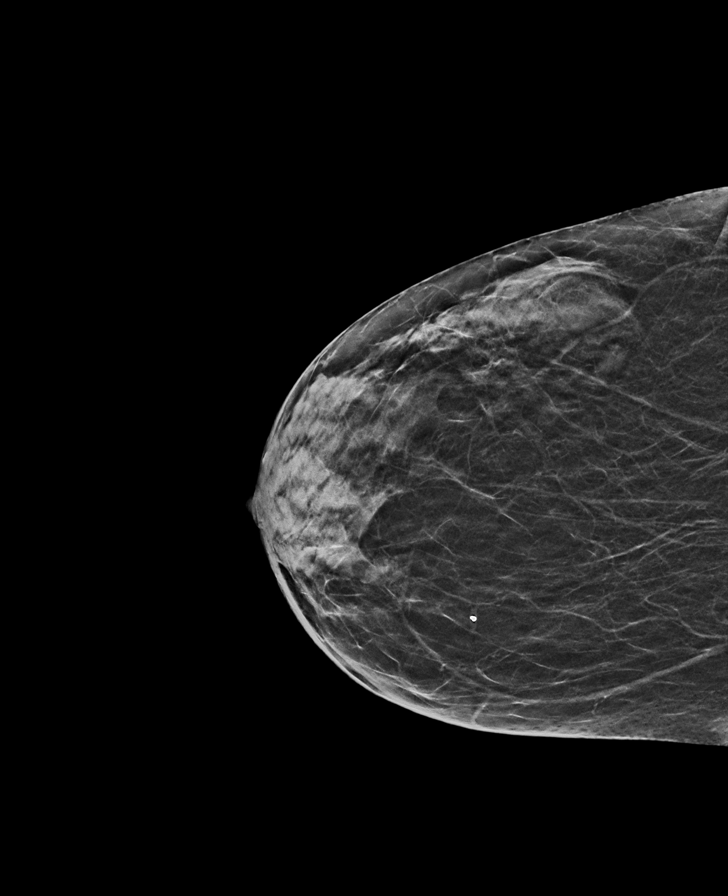

[L CC synth-2D]
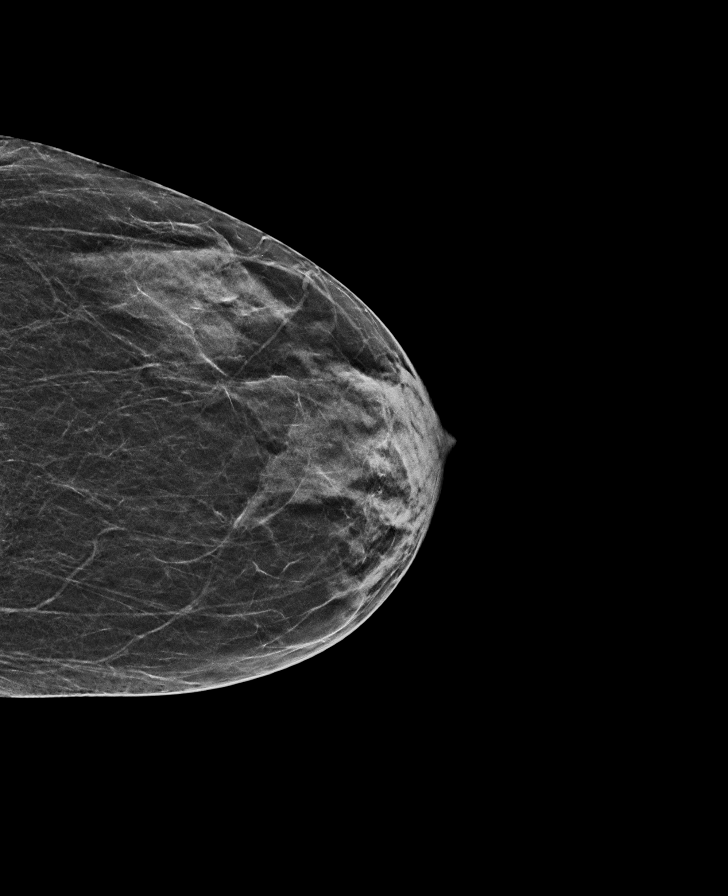

[R MLO synth-2D]
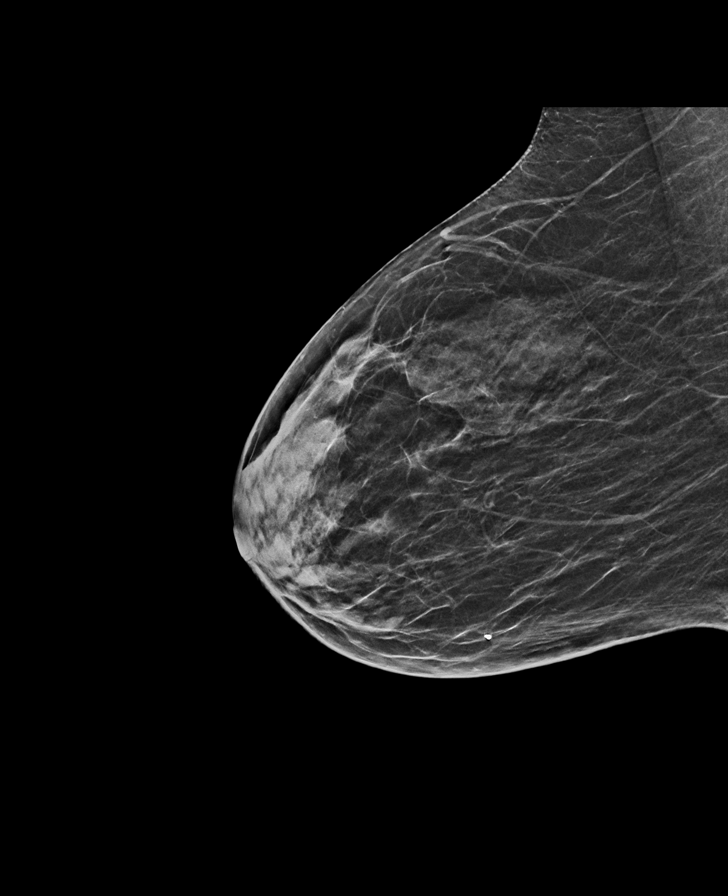

[R MLO]
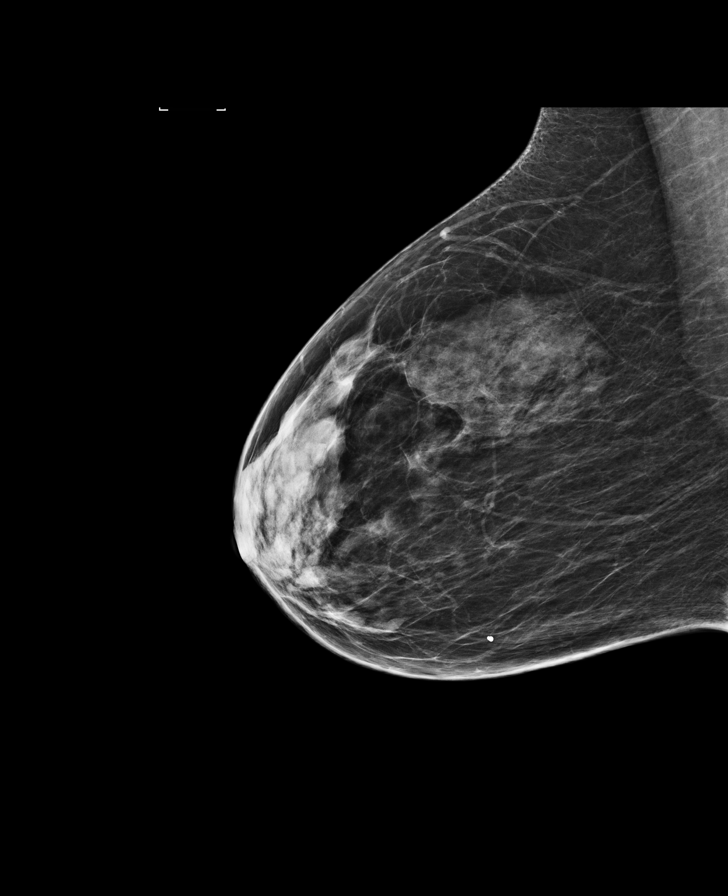

[L MLO]
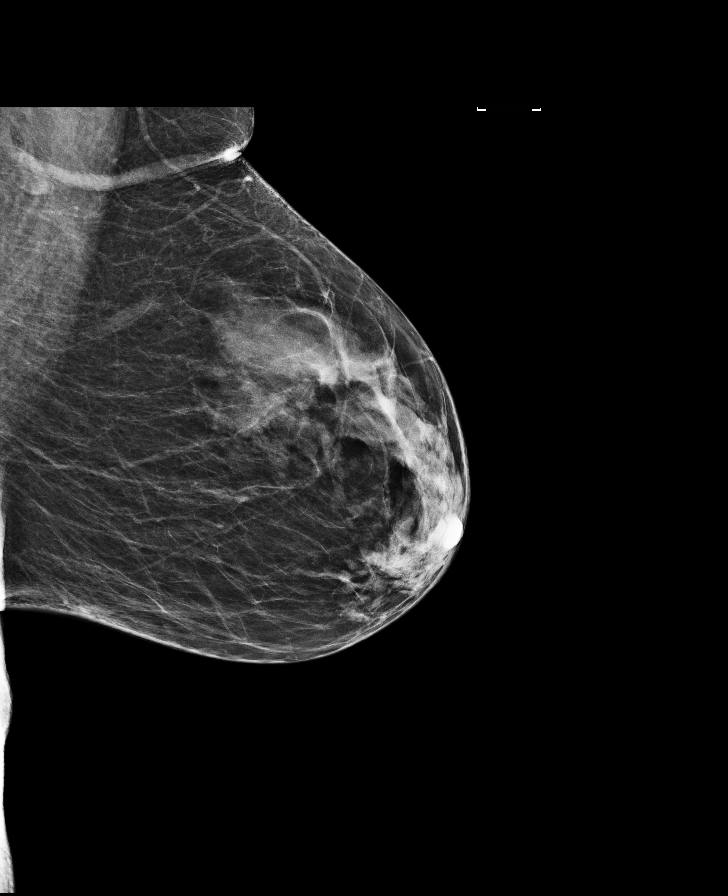

[L CC]
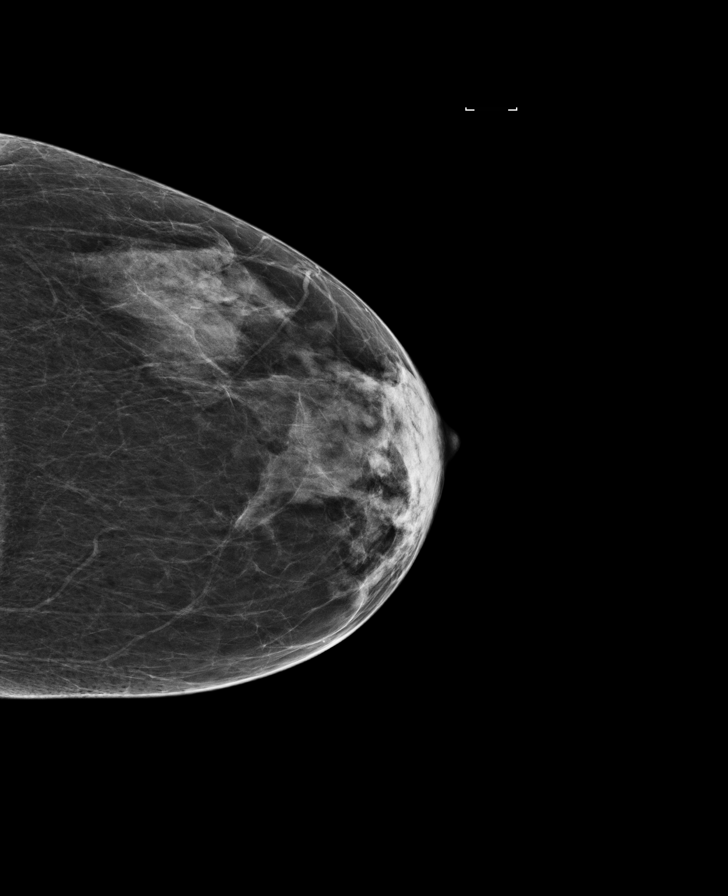

[8 of 28 positions shown; findings below may reference images not displayed]

ACR Breast Density Category c: The breast tissue is heterogeneously
dense, which may obscure small masses.
FINDINGS: There are no findings suspicious for malignancy. Images were
processed with CAD.
IMPRESSION: No mammographic evidence of malignancy. A result letter of this
screening mammogram will be mailed directly to the patient.

RECOMMENDATION:
Screening mammogram in one year. (Code:UA-9-KQN)

BI-RADS CATEGORY  1: Negative.

## 2019-06-23 ENCOUNTER — Ambulatory Visit
Admission: RE | Admit: 2019-06-23 | Discharge: 2019-06-23 | Disposition: A | Discharge status: Home / Self Care | Payer: Medicare Other | Source: Ambulatory Visit | Attending: Family Medicine | Admitting: Family Medicine

## 2019-06-23 DIAGNOSIS — Z1231 Encounter for screening mammogram for malignant neoplasm of breast: Secondary | ICD-10-CM | POA: Insufficient documentation

## 2019-07-23 ENCOUNTER — Ambulatory Visit: Payer: Medicare Other | Attending: Internal Medicine

## 2019-07-23 ENCOUNTER — Ambulatory Visit: Payer: Medicare Other

## 2019-07-23 DIAGNOSIS — Z23 Encounter for immunization: Secondary | ICD-10-CM

## 2019-07-23 NOTE — Progress Notes (Signed)
   Covid-19 Vaccination Clinic  Name:  Lori Wagner    MRN: [id number] DOB: 19-Jan-1951  07/23/2019  Lori Wagner was observed post Covid-19 immunization for 15 minutes without incidence. She was provided with Vaccine Information Sheet and instruction to access the V-Safe system.   Lori Wagner was instructed to call 911 with any severe reactions post vaccine: Marland Kitchen Difficulty breathing  . Swelling of your face and throat  . A fast heartbeat  . A bad rash all over your body  . Dizziness and weakness    Immunizations Administered    Name Date Dose VIS Date Route   Pfizer COVID-19 Vaccine 07/23/2019  9:07 AM 0.3 mL 05/07/2019 Intramuscular   Manufacturer: [place]   Lot: [id number]   [place]: [id number]

## 2019-08-17 ENCOUNTER — Ambulatory Visit: Payer: Medicare Other | Attending: Internal Medicine

## 2019-08-17 DIAGNOSIS — Z23 Encounter for immunization: Secondary | ICD-10-CM

## 2019-08-17 NOTE — Progress Notes (Signed)
   Covid-19 Vaccination Clinic  Name:  Lori Wagner    MRN: [id number] DOB: 1950/08/08  08/17/2019  [name] was observed post Covid-19 immunization for 15 minutes without incident. She was provided with Vaccine Information Sheet and instruction to access the V-Safe system.   [name] was instructed to call 911 with any severe reactions post vaccine: Marland Kitchen Difficulty breathing  . Swelling of face and throat  . A fast heartbeat  . A bad rash all over body  . Dizziness and weakness   Immunizations Administered    Name Date Dose VIS Date Route   Pfizer COVID-19 Vaccine 08/17/2019 10:41 AM 0.3 mL 05/07/2019 Intramuscular   Manufacturer: [place]   Lot: [id number]   [place]: [id number]

## 2019-11-24 ENCOUNTER — Other Ambulatory Visit: Payer: Self-pay

## 2019-11-24 ENCOUNTER — Emergency Department: Payer: Medicare Other

## 2019-11-24 ENCOUNTER — Encounter: Payer: Self-pay | Admitting: Emergency Medicine

## 2019-11-24 ENCOUNTER — Observation Stay
Admission: EM | Admit: 2019-11-24 | Discharge: 2019-11-25 | Disposition: A | Discharge status: Home / Self Care | Payer: Medicare Other | Attending: Internal Medicine | Admitting: Internal Medicine

## 2019-11-24 DIAGNOSIS — I471 Supraventricular tachycardia, unspecified: Secondary | ICD-10-CM | POA: Diagnosis present

## 2019-11-24 DIAGNOSIS — M069 Rheumatoid arthritis, unspecified: Secondary | ICD-10-CM | POA: Diagnosis not present

## 2019-11-24 DIAGNOSIS — N189 Chronic kidney disease, unspecified: Secondary | ICD-10-CM | POA: Insufficient documentation

## 2019-11-24 DIAGNOSIS — Z880 Allergy status to penicillin: Secondary | ICD-10-CM | POA: Insufficient documentation

## 2019-11-24 DIAGNOSIS — M51369 Other intervertebral disc degeneration, lumbar region without mention of lumbar back pain or lower extremity pain: Secondary | ICD-10-CM | POA: Diagnosis present

## 2019-11-24 DIAGNOSIS — Z882 Allergy status to sulfonamides status: Secondary | ICD-10-CM | POA: Insufficient documentation

## 2019-11-24 DIAGNOSIS — Z79899 Other long term (current) drug therapy: Secondary | ICD-10-CM | POA: Diagnosis not present

## 2019-11-24 DIAGNOSIS — F419 Anxiety disorder, unspecified: Secondary | ICD-10-CM | POA: Diagnosis not present

## 2019-11-24 DIAGNOSIS — I739 Peripheral vascular disease, unspecified: Secondary | ICD-10-CM | POA: Insufficient documentation

## 2019-11-24 DIAGNOSIS — E785 Hyperlipidemia, unspecified: Secondary | ICD-10-CM | POA: Diagnosis present

## 2019-11-24 DIAGNOSIS — G4700 Insomnia, unspecified: Secondary | ICD-10-CM | POA: Insufficient documentation

## 2019-11-24 DIAGNOSIS — E039 Hypothyroidism, unspecified: Secondary | ICD-10-CM | POA: Diagnosis present

## 2019-11-24 DIAGNOSIS — I25118 Atherosclerotic heart disease of native coronary artery with other forms of angina pectoris: Principal | ICD-10-CM | POA: Insufficient documentation

## 2019-11-24 DIAGNOSIS — Z951 Presence of aortocoronary bypass graft: Secondary | ICD-10-CM | POA: Insufficient documentation

## 2019-11-24 DIAGNOSIS — M5136 Other intervertebral disc degeneration, lumbar region: Secondary | ICD-10-CM | POA: Diagnosis not present

## 2019-11-24 DIAGNOSIS — Z8249 Family history of ischemic heart disease and other diseases of the circulatory system: Secondary | ICD-10-CM | POA: Diagnosis not present

## 2019-11-24 DIAGNOSIS — I129 Hypertensive chronic kidney disease with stage 1 through stage 4 chronic kidney disease, or unspecified chronic kidney disease: Secondary | ICD-10-CM | POA: Diagnosis not present

## 2019-11-24 DIAGNOSIS — Z7982 Long term (current) use of aspirin: Secondary | ICD-10-CM | POA: Insufficient documentation

## 2019-11-24 DIAGNOSIS — E7801 Familial hypercholesterolemia: Secondary | ICD-10-CM | POA: Insufficient documentation

## 2019-11-24 DIAGNOSIS — Z20822 Contact with and (suspected) exposure to covid-19: Secondary | ICD-10-CM | POA: Diagnosis not present

## 2019-11-24 DIAGNOSIS — F329 Major depressive disorder, single episode, unspecified: Secondary | ICD-10-CM | POA: Diagnosis not present

## 2019-11-24 DIAGNOSIS — Z9884 Bariatric surgery status: Secondary | ICD-10-CM | POA: Insufficient documentation

## 2019-11-24 DIAGNOSIS — R079 Chest pain, unspecified: Secondary | ICD-10-CM | POA: Diagnosis present

## 2019-11-24 DIAGNOSIS — F32A Depression, unspecified: Secondary | ICD-10-CM | POA: Diagnosis present

## 2019-11-24 DIAGNOSIS — I2510 Atherosclerotic heart disease of native coronary artery without angina pectoris: Secondary | ICD-10-CM | POA: Diagnosis present

## 2019-11-24 DIAGNOSIS — Z885 Allergy status to narcotic agent status: Secondary | ICD-10-CM | POA: Insufficient documentation

## 2019-11-24 DIAGNOSIS — K5900 Constipation, unspecified: Secondary | ICD-10-CM | POA: Diagnosis present

## 2019-11-24 HISTORY — DX: Supraventricular tachycardia: I47.1

## 2019-11-24 HISTORY — DX: Supraventricular tachycardia, unspecified: I47.10

## 2019-11-24 LAB — LIPID PANEL
Cholesterol: 166 mg/dL (ref 0–200)
HDL: 69 mg/dL (ref >40)
LDL Cholesterol: 59 mg/dL (ref 0–99)
Total CHOL/HDL Ratio: 2.4 RATIO
Triglycerides: 192 mg/dL — ABNORMAL HIGH (ref <150)
VLDL: 38 mg/dL (ref 0–40)

## 2019-11-24 LAB — BASIC METABOLIC PANEL
Anion gap: 10 (ref 5–15)
BUN: 21 mg/dL (ref 8–23)
CO2: 23 mmol/L (ref 22–32)
Calcium: 9.4 mg/dL (ref 8.9–10.3)
Chloride: 105 mmol/L (ref 98–111)
Creatinine, Ser: 1.24 mg/dL — ABNORMAL HIGH (ref 0.44–1.00)
GFR calc Af Amer: 52 mL/min — ABNORMAL LOW (ref >60)
GFR calc non Af Amer: 45 mL/min — ABNORMAL LOW (ref >60)
Glucose, Bld: 117 mg/dL — ABNORMAL HIGH (ref 70–99)
Potassium: 3.8 mmol/L (ref 3.5–5.1)
Sodium: 138 mmol/L (ref 135–145)

## 2019-11-24 LAB — CBC
HCT: 39.5 % (ref 36.0–46.0)
Hemoglobin: 14.1 g/dL (ref 12.0–15.0)
MCH: 32.3 pg (ref 26.0–34.0)
MCHC: 35.7 g/dL (ref 30.0–36.0)
MCV: 90.4 fL (ref 80.0–100.0)
Platelets: 255 10*3/uL (ref 150–400)
RBC: 4.37 MIL/uL (ref 3.87–5.11)
RDW: 12.3 % (ref 11.5–15.5)
WBC: 8.3 10*3/uL (ref 4.0–10.5)
nRBC: 0 % (ref 0.0–0.2)

## 2019-11-24 LAB — HIV ANTIBODY (ROUTINE TESTING W REFLEX): HIV Screen 4th Generation wRfx: NONREACTIVE

## 2019-11-24 LAB — TROPONIN I (HIGH SENSITIVITY)
Troponin I (High Sensitivity): 8 ng/L (ref <18)
Troponin I (High Sensitivity): 9 ng/L (ref <18)
Troponin I (High Sensitivity): 9 ng/L (ref <18)

## 2019-11-24 LAB — DIGOXIN LEVEL: Digoxin Level: 0.8 ng/mL (ref 0.8–2.0)

## 2019-11-24 LAB — SARS CORONAVIRUS 2 (TAT 6-24 HRS): SARS Coronavirus 2: NEGATIVE

## 2019-11-24 MED ORDER — POLYETHYLENE GLYCOL 3350 17 G PO PACK: 17.0000 g | PACK | Freq: Every day | ORAL | Status: DC | PRN

## 2019-11-24 MED ORDER — ONDANSETRON HCL 4 MG/2ML IJ SOLN: 4.0000 mg | Freq: Four times a day (QID) | INTRAMUSCULAR | Status: DC | PRN

## 2019-11-24 MED ORDER — ACETAMINOPHEN 325 MG PO TABS: 650.0000 mg | ORAL_TABLET | ORAL | Status: DC | PRN

## 2019-11-24 MED ORDER — NITROGLYCERIN 0.4 MG SL SUBL
0.4000 mg | SUBLINGUAL_TABLET | SUBLINGUAL | Status: DC | PRN
  Administered 2019-11-24: 0.4 mg via SUBLINGUAL
  Filled 2019-11-24: qty 1

## 2019-11-24 MED ORDER — BISACODYL 5 MG PO TBEC: 5.0000 mg | DELAYED_RELEASE_TABLET | Freq: Every day | ORAL | Status: DC | PRN

## 2019-11-24 MED ORDER — ENOXAPARIN SODIUM 40 MG/0.4ML ~~LOC~~ SOLN
40.0000 mg | SUBCUTANEOUS | Status: DC
  Administered 2019-11-24: 40 mg via SUBCUTANEOUS
  Filled 2019-11-24: qty 0.4

## 2019-11-24 MED ORDER — ASPIRIN 81 MG PO CHEW
324.0000 mg | CHEWABLE_TABLET | Freq: Once | ORAL | Status: AC
  Administered 2019-11-24: 324 mg via ORAL
  Filled 2019-11-24: qty 4

## 2019-11-24 NOTE — ED Provider Notes (Signed)
[facility] Emergency Department Provider Note   ____________________________________________   First MD Initiated Contact with Patient 11/24/19 1350     (approximate)  I have reviewed the triage vital signs and the nursing notes.   HISTORY  Chief Complaint Chest Pain    HPI Lori Wagner is a 69 y.o. female CAD s/p CABG, hypertension, CKD, RA, and SVT presents to the ED complaining of chest pain.  Patient reports that she had acute onset of "angina" while she was at a woodworking class earlier today.  She describes it as a pressure that was associated with some mild shortness of breath but no nausea or diaphoresis.  She describes the symptoms as similar to when she had a prior heart attack.  Symptoms have gradually improved since onset and almost completely resolved now that she is resting in the stretcher.  It radiated to her back at one point in time, but this also seems to be resolving.  She has otherwise been feeling well recently with no fevers or cough, denies any pain or swelling in her legs.        Past Medical History:  Diagnosis Date  . Anxiety   . Arthritis    knees,hip , back and hands  . Chronic kidney disease    Hx of kidney stones, years ago  . Coronary artery disease   . Dysrhythmia    SVT- controlled with Beta blocker  . Family history of bone cancer   . Family history of lung cancer   . Family history of ovarian cancer   . Fatigue   . HOH (hard of hearing)    tinnitis both ears  . Hypertension     can cause lightheadedness  . Motion sickness   . Neuromuscular disorder ([place])    carpal tunnel both hands  . PONV (postoperative nausea and vomiting)   . Post herpetic neuralgia    left side of face  . RA (rheumatoid arthritis) ([place])   . Shingles 02/2017   left side of face  . Sleep apnea    Hx of/ when overweight, no CPAP  . SVT (supraventricular tachycardia) ([place])   . Thyroid disease    HX OF/ NO LONGER TAKING  THYROID MEDS, THYROID TESTS CAME BACK NORMAL  . Vertigo    Hx of    Patient Active Problem List   Diagnosis Date Noted  . Chest pain 11/24/2019  . Genetic testing 02/02/2019  . Family history of ovarian cancer   . Family history of bone cancer   . Family history of lung cancer   . CAD (coronary artery disease) 07/03/2017  . Hypotension 07/03/2017  . Proctitis 01/07/2017  . Constipation   . LLQ abdominal pain   . Abnormal CT of the abdomen   . Tremor 08/07/2016  . SVT (supraventricular tachycardia) ([place]) 06/25/2016  . Trigger finger of right thumb 01/18/2016  . Carpal tunnel syndrome 10/13/2015  . Bilateral carpal tunnel syndrome 10/13/2015  . Arthralgia of multiple joints 07/03/2015  . CFIDS (chronic fatigue and immune dysfunction syndrome) ([place]) 07/03/2015  . Degeneration of intervertebral disc of lumbar region 07/03/2015  . Hand paresthesia 07/03/2015  . Major depressive disorder, recurrent, severe without psychotic features ([place]) 12/21/2014  . Panic disorder 12/21/2014  . Impingement syndrome of shoulder 07/22/2014  . Infraspinatus tenosynovitis 07/22/2014  . Impingement syndrome of left shoulder 07/22/2014  . Other synovitis and tenosynovitis, left shoulder 07/22/2014  . Left supraspinatus tenosynovitis 07/22/2014  . Acquired hypothyroidism 07/12/2014  .  Pain in shoulder 07/12/2014  . Clinical depression 07/12/2014  . Combined fat and carbohydrate induced hyperlipemia 07/12/2014    Past Surgical History:  Procedure Laterality Date  . ANKLE SURGERY Left   . APPENDECTOMY    . BLADDER REPAIR    . CARDIAC CATHETERIZATION    . CARDIAC SURGERY    . CATARACT EXTRACTION W/PHACO Left 06/03/2017   Procedure: CATARACT EXTRACTION PHACO AND INTRAOCULAR LENS PLACEMENT ([name])  LEFT;  Surgeon: Eulogio Bear, MD;  Location: [place];  Service: Ophthalmology;  Laterality: Left;  . CATARACT EXTRACTION W/PHACO Right 07/08/2017   Procedure: CATARACT EXTRACTION PHACO AND  INTRAOCULAR LENS PLACEMENT ([place]) RIGHT;  Surgeon: Eulogio Bear, MD;  Location: [place];  Service: Ophthalmology;  Laterality: Right;  . COLONOSCOPY N/A 01/08/2017   Procedure: COLONOSCOPY;  Surgeon: [name], MD;  [facility];  Service: Gastroenterology;  Laterality: N/A;  . CORONARY ARTERY BYPASS GRAFT  2001   x4  . GASTRIC BYPASS     SLEEVE  . HAND SURGERY Right    2 TRIGGER FINGERS  . KNEE ARTHROSCOPY Right   . renal colic    . TRIGGER FINGER RELEASE Right 01/17/2016   Procedure: RELEASE TRIGGER FINGER/A-1 PULLEY THUMB;  Surgeon: Corky Mull, MD;  Location: [place];  Service: Orthopedics;  Laterality: Right;    Prior to Admission medications   Medication Sig Start Date End Date Taking? Authorizing Provider  aspirin 81 MG tablet Take 162 mg by mouth daily. 2 tablets daily /am    [provider]  atorvastatin (LIPITOR) 80 MG tablet Take 80 mg by mouth at bedtime.     [provider]  bisoprolol (ZEBETA) 5 MG tablet Take 2.5 mg by mouth daily.     [provider]  buPROPion (WELLBUTRIN SR) 200 MG 12 hr tablet Take 1 tablet (200 mg total) by mouth every morning. 10/14/17 10/14/18  Elvin So, MD  digoxin (LANOXIN) 0.125 MG tablet Take 0.125 mg by mouth daily.     [provider]  DULoxetine (CYMBALTA) 60 MG capsule Take 1 capsule (60 mg total) by mouth daily. 10/14/17   Elvin So, MD  gabapentin (NEURONTIN) 300 MG capsule Take 300 mg by mouth at bedtime.     [provider]  isosorbide mononitrate (IMDUR) 30 MG 24 hr tablet Take 30 mg by mouth daily. am    [provider]  nitroGLYCERIN (NITROSTAT) 0.4 MG SL tablet Place 0.4 mg under the tongue every 5 (five) minutes as needed for chest pain.    [provider]  Polyethylene Glycol 3350 (MIRALAX PO) Take by mouth daily as needed.    [provider]  traZODone (DESYREL) 50 MG tablet Take 1 tablet (50 mg total)  by mouth at bedtime. 09/23/17   Elvin So, MD    Allergies Epinephrine, Sulfa antibiotics, Ace inhibitors, Morphine and related, Penicillins, and Cynara scolymus (artichoke)  Family History  Problem Relation Age of Onset  . Hypertension Mother   . Coronary artery disease Mother   . Arthritis Mother   . Osteoporosis Mother   . Depression Mother   . Anxiety disorder Mother   . Coronary artery disease Father   . Stroke Father   . Coronary artery disease Brother   . Bipolar disorder Brother   . Drug abuse Brother   . Coronary artery disease Maternal Aunt   . Osteoporosis Maternal Aunt   . Coronary artery disease Cousin   . Osteoporosis Cousin   .  Anxiety disorder Daughter   . Depression Daughter   . Bone cancer Paternal Uncle   . Lung cancer Paternal Grandmother   . Ovarian cancer Cousin 70       CHEK2+  . Breast cancer Neg Hx     Social History Social History   Tobacco Use  . Smoking status: Never Smoker  . Smokeless tobacco: Never Used  Vaping Use  . Vaping Use: Never used  Substance Use Topics  . Alcohol use: No  . Drug use: No    Review of Systems  Constitutional: No fever/chills Eyes: No visual changes. ENT: No sore throat. Cardiovascular: Positive for chest pain. Respiratory: Positive for shortness of breath. Gastrointestinal: No abdominal pain.  No nausea, no vomiting.  No diarrhea.  No constipation. Genitourinary: Negative for dysuria. Musculoskeletal: Negative for back pain. Skin: Negative for rash. Neurological: Negative for headaches, focal weakness or numbness.  ____________________________________________   PHYSICAL EXAM:  VITAL SIGNS: ED Triage Vitals [11/24/19 1328]  Enc Vitals Group     BP (!) 100/58     Pulse Rate 77     Resp 16     Temp 97.7 F (36.5 C)     Temp Source Oral     SpO2 99 %     Weight 154 lb (69.9 kg)     Height _0  (1.6 m)     Head Circumference      Peak Flow      Pain Score 9     Pain Loc      Pain  Edu?      Excl. in [place]?     Constitutional: Alert and oriented. Eyes: Conjunctivae are normal. Head: Atraumatic. Nose: No congestion/rhinnorhea. Mouth/Throat: Mucous membranes are moist. Neck: Normal ROM Cardiovascular: Normal rate, regular rhythm. Grossly normal heart sounds.  2+ radial pulses bilaterally. Respiratory: Normal respiratory effort.  No retractions. Lungs CTAB. Gastrointestinal: Soft and nontender. No distention. Genitourinary: deferred Musculoskeletal: No lower extremity tenderness nor edema. Neurologic:  Normal speech and language. No gross focal neurologic deficits are appreciated. Skin:  Skin is warm, dry and intact. No rash noted. Psychiatric: Mood and affect are normal. Speech and behavior are normal.  ____________________________________________   LABS (all labs ordered are listed, but only abnormal results are displayed)  Labs Reviewed  BASIC METABOLIC PANEL - Abnormal; Notable for the following components:      Result Value   Glucose, Bld 117 (*)    Creatinine, Ser 1.24 (*)    GFR calc non Af Amer 45 (*)    GFR calc Af Amer 52 (*)    All other components within normal limits  SARS CORONAVIRUS 2 (TAT 6-24 HRS)  CBC  DIGOXIN LEVEL  HIV ANTIBODY (ROUTINE TESTING W REFLEX)  LIPID PANEL  TROPONIN I (HIGH SENSITIVITY)  TROPONIN I (HIGH SENSITIVITY)  TROPONIN I (HIGH SENSITIVITY)   ____________________________________________  EKG  ED ECG REPORT I, Blake Divine, the attending physician, personally viewed and interpreted this ECG.   Date: 11/24/2019  EKG Time: 13:22  Rate: 77  Rhythm: normal sinus rhythm  Axis: Normal  Intervals:right bundle branch block  ST&T Change: None   PROCEDURES  Procedure(s) performed (including Critical Care):  Procedures   ____________________________________________   INITIAL IMPRESSION / ASSESSMENT AND PLAN / ED COURSE       69 year old female with past medical history of CAD status post CABG presents  to the ED complaining of acute onset chest pressure while at a woodworking class earlier today.  The  symptoms seem to be gradually resolving and have now entirely resolved after 1 dose of nitroglycerin.  EKG shows known right bundle branch block with no acute ischemic changes and initial troponin is negative.  I doubt dissection or PE given resolving symptoms. While work-up thus far is unremarkable, patient's history and description of symptoms are concerning and we will admit for high risk chest pain.  Case was discussed with hospitalist for admission.      ____________________________________________   FINAL CLINICAL IMPRESSION(S) / ED DIAGNOSES  Final diagnoses:  Nonspecific chest pain     ED Discharge Orders    None       Note:  This document was prepared using Dragon voice recognition software and may include unintentional dictation errors.   Blake Divine, MD 11/24/19 [phone]

## 2019-11-24 NOTE — ED Triage Notes (Signed)
Pt here for acute chest pain starting at 1215 today.  Hx of quadruple bypass.  Also c/o SHOB.  Pain is constant and non radiating but also c/o pain to upper mid back.  Skin warm and dry at this time.  VSS.  No nausea or vomiting. Also c/o left arm tingling that started during triage.

## 2019-11-24 NOTE — Progress Notes (Signed)
Pt received to 2a, room 257, from ED.  Pt oriented to room and call bell.  See assessment and vs's.  Pt denies pain or distress.  SB 57.

## 2019-11-24 NOTE — H&P (Signed)
History and Physical    Lori Wagner GGY:[id number] DOB: [birth year] DOA: 11/24/2019  PCP: [name], MD  Patient coming from: home  I have personally briefly reviewed patient's old medical records in [facility]  Chief Complaint: chest pain  HPI: Lori Wagner is a 69 y.o. female with medical history significant of coronary artery disease status post CABG about 20 years ago (follows with cardiology at [facility]), SVT, PAD, hypothyroidism, depression anxiety, chronic fatigue, lumbar disc degeneration presented to the ED this afternoon with acute onset of chest pain.  Patient reports that she was at a woodworking class without air conditioning in the building, it was very hot and humid and she started to feel woozy and then have angina.  She thought this would improve in air conditioning, so got in her car to drive home but the pain did not improve.  She asked her husband to bring her to the ED for evaluation.  She reports associated shortness of breath.  Does report that this feels like her prior episodes of chest pain.  She does say she has never had a heart attack, surgery was done due to severe blockages in multiple coronaries.  She otherwise denies any recent acute illnesses including fevers or chills, cough or congestion, nausea vomiting or diarrhea, dysuria, headaches.  She reports about 10 pounds of weight loss over the past 6 months to year.  PCP is recommending a GI evaluation.  Patient denies seeing any bloody or dark tarry stools.  She reports that she has chronic EKG changes including right bundle branch block and T wave inversions since her CABG years ago.  Patient reports that chest pain was improving upon arrival to the ED had nearly resolved.  Was given sublingual nitro and chest pain completely resolved.  ED Course: Afebrile, heart rate 77, respirations 16, BP 100/58, satting 99% room air.  CBC was unremarkable.  BMP was notable for creatinine 1.24 and glucose 117.   Initial troponin was normal at 8.  Chest x-ray was negative for acute findings.  EKG was normal sinus rhythm 77 bpm, with right bundle branch block and ST depression versus T wave inversions in V1 through V4.  Patient was admitted to hospitalist service for observation and further evaluation.  Review of Systems: As per HPI otherwise 10 point review of systems negative.    Past Medical History:  Diagnosis Date  . Anxiety   . Arthritis    knees,hip , back and hands  . Chronic kidney disease    Hx of kidney stones, years ago  . Coronary artery disease   . Dysrhythmia    SVT- controlled with Beta blocker  . Family history of bone cancer   . Family history of lung cancer   . Family history of ovarian cancer   . Fatigue   . HOH (hard of hearing)    tinnitis both ears  . Hypertension     can cause lightheadedness  . Motion sickness   . Neuromuscular disorder ([place])    carpal tunnel both hands  . PONV (postoperative nausea and vomiting)   . Post herpetic neuralgia    left side of face  . RA (rheumatoid arthritis) ([place])   . Shingles 02/2017   left side of face  . Sleep apnea    Hx of/ when overweight, no CPAP  . SVT (supraventricular tachycardia) ([place])   . Thyroid disease    HX OF/ NO LONGER TAKING THYROID MEDS, THYROID TESTS CAME BACK NORMAL  .  Vertigo    Hx of    Past Surgical History:  Procedure Laterality Date  . ANKLE SURGERY Left   . APPENDECTOMY    . BLADDER REPAIR    . CARDIAC CATHETERIZATION    . CARDIAC SURGERY    . CATARACT EXTRACTION W/PHACO Left 06/03/2017   Procedure: CATARACT EXTRACTION PHACO AND INTRAOCULAR LENS PLACEMENT ([place])  LEFT;  Surgeon: Eulogio Bear, MD;  Location: [place];  Service: Ophthalmology;  Laterality: Left;  . CATARACT EXTRACTION W/PHACO Right 07/08/2017   Procedure: CATARACT EXTRACTION PHACO AND INTRAOCULAR LENS PLACEMENT ([place]) RIGHT;  Surgeon: Eulogio Bear, MD;  Location: [place];  Service: Ophthalmology;   Laterality: Right;  . COLONOSCOPY N/A 01/08/2017   Procedure: COLONOSCOPY;  Surgeon: [name], MD;  Location: [facility] ENDOSCOPY;  Service: Gastroenterology;  Laterality: N/A;  . CORONARY ARTERY BYPASS GRAFT  2001   x4  . GASTRIC BYPASS     SLEEVE  . HAND SURGERY Right    2 TRIGGER FINGERS  . KNEE ARTHROSCOPY Right   . renal colic    . TRIGGER FINGER RELEASE Right 01/17/2016   Procedure: RELEASE TRIGGER FINGER/A-1 PULLEY THUMB;  Surgeon: Corky Mull, MD;  Location: [place];  Service: Orthopedics;  Laterality: Right;     reports that she has never smoked. She has never used smokeless tobacco. She reports that she does not drink alcohol and does not use drugs.  Lives at home with husband.  Allergies  Allergen Reactions  . Epinephrine Other (See Comments)    Other Reaction: Other reaction-may go into SVT Pt states she wants this taken out.  . Sulfa Antibiotics Hives and Rash    Other Reaction: Other reaction  . Ace Inhibitors Cough  . Morphine And Related Nausea And Vomiting  . Penicillins Hives  . Cynara Scolymus ([place]) Hives    Family History  Problem Relation Age of Onset  . Hypertension Mother   . Coronary artery disease Mother   . Arthritis Mother   . Osteoporosis Mother   . Depression Mother   . Anxiety disorder Mother   . Coronary artery disease Father   . Stroke Father   . Coronary artery disease Brother   . Bipolar disorder Brother   . Drug abuse Brother   . Coronary artery disease Maternal Aunt   . Osteoporosis Maternal Aunt   . Coronary artery disease Cousin   . Osteoporosis Cousin   . Anxiety disorder Daughter   . Depression Daughter   . Bone cancer Paternal Uncle   . Lung cancer Paternal Grandmother   . Ovarian cancer Cousin 70       CHEK2+  . Breast cancer Neg Hx      Prior to Admission medications   Medication Sig Start Date End Date Taking? Authorizing Provider  aspirin 81 MG tablet Take 162 mg by mouth daily. 2 tablets  daily /am    [provider]  atorvastatin (LIPITOR) 80 MG tablet Take 80 mg by mouth at bedtime.     [provider]  bisoprolol (ZEBETA) 5 MG tablet Take 2.5 mg by mouth daily.     [provider]  buPROPion (WELLBUTRIN SR) 200 MG 12 hr tablet Take 1 tablet (200 mg total) by mouth every morning. 10/14/17 10/14/18  Elvin So, MD  digoxin (LANOXIN) 0.125 MG tablet Take 0.125 mg by mouth daily.     [provider]  DULoxetine (CYMBALTA) 60 MG capsule Take 1 capsule (60 mg  total) by mouth daily. 10/14/17   Elvin So, MD  gabapentin (NEURONTIN) 300 MG capsule Take 300 mg by mouth at bedtime.     [provider]  isosorbide mononitrate (IMDUR) 30 MG 24 hr tablet Take 30 mg by mouth daily. am    [provider]  nitroGLYCERIN (NITROSTAT) 0.4 MG SL tablet Place 0.4 mg under the tongue every 5 (five) minutes as needed for chest pain.    [provider]  Polyethylene Glycol 3350 (MIRALAX PO) Take by mouth daily as needed.    [provider]  traZODone (DESYREL) 50 MG tablet Take 1 tablet (50 mg total) by mouth at bedtime. 09/23/17   Elvin So, MD    Physical Exam: Vitals:   11/24/19 1328 11/24/19 1329  BP: (!) 100/58   Pulse: 77   Resp: 16   Temp: 97.7 F (36.5 C)   TempSrc: Oral   SpO2: 99%   Weight: 69.9 kg 65.3 kg  Height: 5' 3"  (1.6 m)      Constitutional: NAD, calm, comfortable Eyes: EOMI, lids and conjunctivae normal ENMT: Mucous membranes are moist. Normal dentition.  Hearing grossly normal. Respiratory: CTAB, no wheezing, no crackles. Normal respiratory effort. No accessory muscle use.  Cardiovascular: RRR, no murmurs / rubs / gallops. No extremity edema. 2+ pedal pulses.  Abdomen: soft, NT, ND, no masses or HSM palpated. +Bowel sounds.  Musculoskeletal: no clubbing / cyanosis. No joint deformity upper and lower extremities. Normal muscle tone.  Skin: dry, intact, normal color, normal  temperature Neurologic: CN 2-12 grossly intact. Normal speech.  Grossly non-focal exam. Psychiatric: Alert and oriented x 3. Normal mood. Congruent affect.  Normal judgement and insight.    Labs on Admission: I have personally reviewed following labs and imaging studies  CBC: Recent Labs  Lab 11/24/19 1337  WBC 8.3  HGB 14.1  HCT 39.5  MCV 90.4  PLT 540   Basic Metabolic Panel: Recent Labs  Lab 11/24/19 1337  NA 138  K 3.8  CL 105  CO2 23  GLUCOSE 117*  BUN 21  CREATININE 1.24*  CALCIUM 9.4   GFR: Estimated Creatinine Clearance: 40 mL/min (A) (by C-G formula based on SCr of 1.24 mg/dL (H)). Liver Function Tests: No results for input(s): AST, ALT, ALKPHOS, BILITOT, PROT, ALBUMIN in the last 168 hours. No results for input(s): LIPASE, AMYLASE in the last 168 hours. No results for input(s): AMMONIA in the last 168 hours. Coagulation Profile: No results for input(s): INR, PROTIME in the last 168 hours. Cardiac Enzymes: No results for input(s): CKTOTAL, CKMB, CKMBINDEX, TROPONINI in the last 168 hours. BNP (last 3 results) No results for input(s): PROBNP in the last 8760 hours. HbA1C: No results for input(s): HGBA1C in the last 72 hours. CBG: No results for input(s): GLUCAP in the last 168 hours. Lipid Profile: No results for input(s): CHOL, HDL, LDLCALC, TRIG, CHOLHDL, LDLDIRECT in the last 72 hours. Thyroid Function Tests: No results for input(s): TSH, T4TOTAL, FREET4, T3FREE, THYROIDAB in the last 72 hours. Anemia Panel: No results for input(s): VITAMINB12, FOLATE, FERRITIN, TIBC, IRON, RETICCTPCT in the last 72 hours. Urine analysis:    Component Value Date/Time   COLORURINE YELLOW (A) 11/03/2017 1113   APPEARANCEUR CLEAR (A) 11/03/2017 1113   LABSPEC 1.023 11/03/2017 1113   PHURINE 6.0 11/03/2017 1113   GLUCOSEU NEGATIVE 11/03/2017 1113   HGBUR NEGATIVE 11/03/2017 1113   BILIRUBINUR NEGATIVE 11/03/2017 1113   KETONESUR NEGATIVE 11/03/2017 1113    PROTEINUR NEGATIVE 11/03/2017 1113  NITRITE NEGATIVE 11/03/2017 1113   LEUKOCYTESUR NEGATIVE 11/03/2017 1113    Radiological Exams on Admission: DG Chest 2 View  Result Date: 11/24/2019 CLINICAL DATA:  Chest pain. Additional provided: Acute chest pain. Shortness of breath. EXAM: CHEST - 2 VIEW COMPARISON:  Prior chest radiographs 11/03/2017 and earlier FINDINGS: Prior median sternotomy. As before, there are fractures within multiple sternal cerclage wires. Coronary artery stents. Heart size within normal limits. No appreciable airspace consolidation within the lungs. No frank pulmonary edema. No evidence of pleural effusion or pneumothorax. No acute bony abnormality identified. IMPRESSION: No evidence of acute cardiopulmonary abnormality. Electronically Signed   By: Kellie Simmering DO   On: 11/24/2019 14:02    EKG: Independently reviewed.  Normal sinus rhythm, 77 bpm, right bundle branch block, ST depressions in V1 through V4.  Assessment/Plan Principal Problem:   Chest pain Active Problems:   SVT (supraventricular tachycardia) (HCC)   Constipation   CAD (coronary artery disease)   Acquired hypothyroidism   Clinical depression   Hyperlipidemia   Degeneration of intervertebral disc of lumbar region    Chest pain -present on admission, lasted about 1 hour.  Initial troponin normal.  EKG normal sinus with right bundle branch block and ST depressions/T wave inversions in V1 through V4 --Trend troponins --Stat EKG if chest pain recurs --Telemetry --Continue aspirin statin and beta-blocker --Echo ordered --Stress test if troponins remain normal --Cardiology consult and heparin drip if troponins rise --Lipid panel with a.m. labs --As needed sublingual nitro for chest pain  SVT (supraventricular tachycardia) -no recent occurrences, per patient.  Monitor on telemetry.  Continue digoxin pending med history.  Continue home propranolol as needed.  Maintain K > 4, Mg > 2.  Digoxin level with  morning labs.   Constipation -chronic, uses MiraLAX as needed at home.  MiraLAX and Dulcolax as needed.  CAD (coronary artery disease) -status post CABG 20 years ago.  Follows with [facility] cardiology.  Currently with chest pain as above.  Continue aspirin, statin, beta-blocker, Imdur.  Acquired hypothyroidism -continue Synthroid.  TSH with morning labs  Depression /insomnia -continue home Wellbutrin, duloxetine, trazodone  Hyperlipidemia -continue statin  Degeneration of intervertebral disc of lumbar region -continue home gabapentin   DVT prophylaxis: enoxaparin (LOVENOX) injection 40 mg Start: 11/24/19 2200   Code Status: Full Family Communication: None at bedside during encounter, will attempt to call Disposition Plan: Expect discharge home tomorrow if troponins remain normal and chest pain resolves Consults called: None  Admission status:  Status is: Observation  The patient remains OBS appropriate and will d/c before 2 midnights.  Dispo: The patient is from: Home              Anticipated d/c is to: Home              Anticipated d/c date is: 1 day              Patient currently is not medically stable to d/c.         Ezekiel Slocumb, DO [facility]  11/24/2019, 4:32 PM    If 7PM-7AM, please contact night-coverage. How to contact the [facility] Attending or Consulting provider [place] or covering provider during after hours [place], for this patient?    1. Check the care team in [facility] and look for a) attending/consulting [facility] provider listed and b) the [facility] team listed 2. Log into [email or web address] and use [place]'s universal password to access. If you do not have the password, please  contact the hospital operator. 3. Locate the [facility] provider you are looking for under [facility] and page to a number that you can be directly reached. 4. If you still have difficulty reaching the provider, please page the [facility] (Director on Call) for the Hospitalists listed on amion for  assistance.

## 2019-11-24 NOTE — ED Notes (Signed)
Spoke with Tammy, RN taking over care of patient, and she is going to speak with her manager to see if patient can come up if COVID has not been resulted

## 2019-11-24 NOTE — Plan of Care (Signed)

## 2019-11-25 ENCOUNTER — Encounter: Payer: Self-pay | Admitting: Internal Medicine

## 2019-11-25 ENCOUNTER — Observation Stay: Admit: 2019-11-25 | Payer: Medicare Other

## 2019-11-25 ENCOUNTER — Observation Stay (HOSPITAL_BASED_OUTPATIENT_CLINIC_OR_DEPARTMENT_OTHER): Payer: Medicare Other

## 2019-11-25 ENCOUNTER — Observation Stay (HOSPITAL_BASED_OUTPATIENT_CLINIC_OR_DEPARTMENT_OTHER)
Admit: 2019-11-25 | Discharge: 2019-11-25 | Disposition: A | Discharge status: Home / Self Care | Payer: Medicare Other | Attending: Internal Medicine | Admitting: Internal Medicine

## 2019-11-25 DIAGNOSIS — F329 Major depressive disorder, single episode, unspecified: Secondary | ICD-10-CM | POA: Diagnosis not present

## 2019-11-25 DIAGNOSIS — I25118 Atherosclerotic heart disease of native coronary artery with other forms of angina pectoris: Secondary | ICD-10-CM | POA: Diagnosis not present

## 2019-11-25 DIAGNOSIS — E039 Hypothyroidism, unspecified: Secondary | ICD-10-CM

## 2019-11-25 DIAGNOSIS — E782 Mixed hyperlipidemia: Secondary | ICD-10-CM

## 2019-11-25 DIAGNOSIS — I361 Nonrheumatic tricuspid (valve) insufficiency: Secondary | ICD-10-CM

## 2019-11-25 DIAGNOSIS — R079 Chest pain, unspecified: Secondary | ICD-10-CM

## 2019-11-25 DIAGNOSIS — I471 Supraventricular tachycardia: Secondary | ICD-10-CM

## 2019-11-25 LAB — BASIC METABOLIC PANEL
Anion gap: 7 (ref 5–15)
BUN: 17 mg/dL (ref 8–23)
CO2: 29 mmol/L (ref 22–32)
Calcium: 9 mg/dL (ref 8.9–10.3)
Chloride: 108 mmol/L (ref 98–111)
Creatinine, Ser: 0.92 mg/dL (ref 0.44–1.00)
GFR calc Af Amer: >60 mL/min (ref >60)
GFR calc non Af Amer: >60 mL/min (ref >60)
Glucose, Bld: 85 mg/dL (ref 70–99)
Potassium: 4 mmol/L (ref 3.5–5.1)
Sodium: 144 mmol/L (ref 135–145)

## 2019-11-25 LAB — NM MYOCAR MULTI W/SPECT W/WALL MOTION / EF
LV dias vol: 67 mL (ref 46–106)
LV sys vol: 15 mL
Peak HR: 85 {beats}/min
Percent HR: 55 %
Rest HR: 52 {beats}/min
SDS: 5
SRS: 2
SSS: 5
TID: 0.91

## 2019-11-25 LAB — MAGNESIUM: Magnesium: 2.1 mg/dL (ref 1.7–2.4)

## 2019-11-25 LAB — ECHOCARDIOGRAM COMPLETE
Height: 63 in
Weight: 2304 oz

## 2019-11-25 LAB — TSH: TSH: 3.048 u[IU]/mL (ref 0.350–4.500)

## 2019-11-25 MED ORDER — DULOXETINE HCL 60 MG PO CPEP: 60.0000 mg | ORAL_CAPSULE | Freq: Every day | ORAL | Status: DC

## 2019-11-25 MED ORDER — TRAZODONE HCL 50 MG PO TABS: 50.0000 mg | ORAL_TABLET | Freq: Every day | ORAL | Status: AC

## 2019-11-25 MED ORDER — REGADENOSON 0.4 MG/5ML IV SOLN
0.4000 mg | Freq: Once | INTRAVENOUS | Status: AC
  Administered 2019-11-25: 0.4 mg via INTRAVENOUS

## 2019-11-25 MED ORDER — TECHNETIUM TC 99M TETROFOSMIN IV KIT
10.0000 | PACK | Freq: Once | INTRAVENOUS | Status: AC | PRN
  Administered 2019-11-25: 10.301 via INTRAVENOUS

## 2019-11-25 MED ORDER — TECHNETIUM TC 99M TETROFOSMIN IV KIT
30.0000 | PACK | Freq: Once | INTRAVENOUS | Status: AC | PRN
  Administered 2019-11-25: 31.199 via INTRAVENOUS

## 2019-11-25 NOTE — Consult Note (Signed)
Cardiology Consultation:   Patient ID: Lori Wagner MRN: [id number]; DOB: 1950/10/23  Admit date: 11/24/2019 Date of Consult: 11/25/2019  Primary Care Provider: [name], MD [facility] Cardiologist: new to [facility] Primary cardiologist:  Dr. Chong Sicilian, [facility] Reason for consult : Chest pain Physician requesting consult: Dr. Nicole Kindred   Patient Profile:   Lori Wagner is a 68 y.o. female with a hx of coronary artery disease, bypass surgery 2001, hypertension, chronic kidney disease, rheumatoid arthritis, SVT presented to the hospital with chest pain  History of Present Illness:   Lori Wagner reports that she has been in her usual state of health, was attending a woodworking class at [employer] yesterday.  Room where she was working was very hard, she was lightheaded, had to sit down repeatedly secondary to orthostasis symptoms.  Was pushing herself to try to finish a project.  Developed some back discomfort, radiating through to her front, chest discomfort.  Excused herself, went to sit in her car thinking her symptoms would resolve.  After sitting in the car symptoms did not improve, took herself to the emergency room  She typically carries nitroglycerin with her but did not have it at that time, had left at home  In the emergency room EKG nonacute, Troponin within normal range, Admitted for rule out MI further testing in the morning  Overnight no further discomfort in her chest Reports that typically with exertion she does not have chest discomfort.  Wonders if it could have been exhaustion, got overheated  Hospitalist order stress testing this morning showing no significant ischemia, normal ejection fraction  Further troponins were nontrending On my evaluation reports that she feels well, thinks she may have overdone it yesterday, would like to go home  Echocardiogram personally reviewed by myself with normal ejection  fraction, EF estimated greater than 55%, RV normal size with normal function no significant valve disease mildly elevated right heart pressures estimated 35 mmHg   Past Medical History:  Diagnosis Date  . Anxiety   . Arthritis    knees,hip , back and hands  . Chronic kidney disease    Hx of kidney stones, years ago  . Coronary artery disease   . Dysrhythmia    SVT- controlled with Beta blocker  . Family history of bone cancer   . Family history of lung cancer   . Family history of ovarian cancer   . Fatigue   . HOH (hard of hearing)    tinnitis both ears  . Hypertension     can cause lightheadedness  . Motion sickness   . Neuromuscular disorder ([place])    carpal tunnel both hands  . PONV (postoperative nausea and vomiting)   . Post herpetic neuralgia    left side of face  . RA (rheumatoid arthritis) ([place])   . Shingles 02/2017   left side of face  . Sleep apnea    Hx of/ when overweight, no CPAP  . SVT (supraventricular tachycardia) ([place])   . Thyroid disease    HX OF/ NO LONGER TAKING THYROID MEDS, THYROID TESTS CAME BACK NORMAL  . Vertigo    Hx of    Past Surgical History:  Procedure Laterality Date  . ANKLE SURGERY Left   . APPENDECTOMY    . BLADDER REPAIR    . CARDIAC CATHETERIZATION    . CARDIAC SURGERY    . CATARACT EXTRACTION W/PHACO Left 06/03/2017   Procedure: CATARACT EXTRACTION PHACO AND INTRAOCULAR LENS PLACEMENT (IOC)  LEFT;  Surgeon: Eulogio Bear, MD;  Location: [place];  Service: Ophthalmology;  Laterality: Left;  . CATARACT EXTRACTION W/PHACO Right 07/08/2017   Procedure: CATARACT EXTRACTION PHACO AND INTRAOCULAR LENS PLACEMENT ([place]) RIGHT;  Surgeon: Eulogio Bear, MD;  Location: [place];  Service: Ophthalmology;  Laterality: Right;  . COLONOSCOPY N/A 01/08/2017   Procedure: COLONOSCOPY;  Surgeon: [name], MD;  Location: [facility] ENDOSCOPY;  Service: Gastroenterology;  Laterality: N/A;  . CORONARY ARTERY BYPASS  GRAFT  2001   x4  . GASTRIC BYPASS     SLEEVE  . HAND SURGERY Right    2 TRIGGER FINGERS  . KNEE ARTHROSCOPY Right   . renal colic    . TRIGGER FINGER RELEASE Right 01/17/2016   Procedure: RELEASE TRIGGER FINGER/A-1 PULLEY THUMB;  Surgeon: Corky Mull, MD;  Location: [place];  Service: Orthopedics;  Laterality: Right;     Home Medications:  Prior to Admission medications   Medication Sig Start Date End Date Taking? Authorizing Provider  acetaminophen (TYLENOL) 500 MG tablet Take 500-1,000 mg by mouth every 6 (six) hours as needed for mild pain, moderate pain or fever.   Yes [provider]  aspirin 81 MG tablet Take 162 mg by mouth daily.    Yes [provider]  atorvastatin (LIPITOR) 80 MG tablet Take 80 mg by mouth at bedtime.    Yes [provider]  bisoprolol (ZEBETA) 5 MG tablet Take 2.5 mg by mouth every evening.    Yes [provider]  buPROPion (WELLBUTRIN SR) 200 MG 12 hr tablet Take 200 mg by mouth daily.   Yes [provider]  digoxin (LANOXIN) 0.125 MG tablet Take 0.125 mg by mouth daily.    Yes [provider]  diphenhydramine-acetaminophen (TYLENOL PM) 25-500 MG TABS tablet Take 1 tablet by mouth at bedtime as needed (pain or sleep).   Yes [provider]  DULoxetine (CYMBALTA) 60 MG capsule Take 1 capsule (60 mg total) by mouth daily. Patient taking differently: Take 60 mg by mouth at bedtime.  10/14/17  Yes Ravi, Himabindu, MD  gabapentin (NEURONTIN) 600 MG tablet Take 600 mg by mouth at bedtime.    Yes [provider]  isosorbide mononitrate (IMDUR) 60 MG 24 hr tablet Take 60 mg by mouth daily.    Yes [provider]  levothyroxine (SYNTHROID) 50 MCG tablet Take 50 mcg by mouth every evening. 10/16/19  Yes [provider]  nitroGLYCERIN (NITROSTAT) 0.4 MG SL tablet Place 0.4 mg under the tongue every 5 (five) minutes as needed for chest pain.   Yes [provider]    polyethylene glycol (MIRALAX) 17 g packet Take 17 g by mouth daily as needed for mild constipation or moderate constipation.    Yes [provider]  traZODone (DESYREL) 50 MG tablet Take 1 tablet (50 mg total) by mouth at bedtime. Patient taking differently: Take 50-100 mg by mouth at bedtime.  09/23/17  Yes Elvin So, MD    Inpatient Medications: Scheduled Meds: . enoxaparin (LOVENOX) injection  40 mg Subcutaneous Q24H   Continuous Infusions:  PRN Meds: acetaminophen, bisacodyl, nitroGLYCERIN, ondansetron (ZOFRAN) IV, polyethylene glycol  Allergies:    Allergies  Allergen Reactions  . Epinephrine Other (See Comments)    Other Reaction: Other reaction-may go into SVT Pt states she wants this taken out.  . Sulfa Antibiotics Hives and Rash    Other Reaction: Other reaction  . Ace Inhibitors Cough  . Morphine  And Related Nausea And Vomiting  . Penicillins Hives  . Cynara Scolymus ([place]) Hives    Social History:   Social History   Socioeconomic History  . Marital status: Married    Spouse name: Iona Beard  . Number of children: 1  . Years of education: Not on file  . Highest education level: Bachelor's degree (e.g., BA, AB, BS)  Occupational History    Comment: retired  Tobacco Use  . Smoking status: Never Smoker  . Smokeless tobacco: Never Used  Vaping Use  . Vaping Use: Never used  Substance and Sexual Activity  . Alcohol use: No  . Drug use: No  . Sexual activity: Never  Other Topics Concern  . Not on file  Social History Narrative  . Not on file   Social Determinants of Health   Financial Resource Strain:   . Difficulty of Paying Living Expenses:   Food Insecurity:   . Worried About Charity fundraiser in the Last Year:   . Arboriculturist in the Last Year:   Transportation Needs:   . Film/video editor (Medical):   Marland Kitchen Lack of Transportation (Non-Medical):   Physical Activity:   . Days of Exercise per Week:   . Minutes of Exercise  per Session:   Stress:   . Feeling of Stress :   Social Connections:   . Frequency of Communication with Friends and Family:   . Frequency of Social Gatherings with Friends and Family:   . Attends Religious Services:   . Active Member of Clubs or Organizations:   . Attends Archivist Meetings:   Marland Kitchen Marital Status:   Intimate Partner Violence:   . Fear of Current or Ex-Partner:   . Emotionally Abused:   Marland Kitchen Physically Abused:   . Sexually Abused:     Family History:    Family History  Problem Relation Age of Onset  . Hypertension Mother   . Coronary artery disease Mother   . Arthritis Mother   . Osteoporosis Mother   . Depression Mother   . Anxiety disorder Mother   . Coronary artery disease Father   . Stroke Father   . Coronary artery disease Brother   . Bipolar disorder Brother   . Drug abuse Brother   . Coronary artery disease Maternal Aunt   . Osteoporosis Maternal Aunt   . Coronary artery disease Cousin   . Osteoporosis Cousin   . Anxiety disorder Daughter   . Depression Daughter   . Bone cancer Paternal Uncle   . Lung cancer Paternal Grandmother   . Ovarian cancer Cousin 70       CHEK2+  . Breast cancer Neg Hx      ROS:  Please see the history of present illness.  Review of Systems  Constitutional: Negative.   HENT: Negative.   Respiratory: Negative.   Cardiovascular: Positive for chest pain.  Gastrointestinal: Negative.   Musculoskeletal: Negative.   Neurological: Negative.   Psychiatric/Behavioral: Negative.   All other systems reviewed and are negative.   Physical Exam/Data:   Vitals:   11/25/19 0750 11/25/19 1213 11/25/19 1214 11/25/19 1514  BP: 133/62 (!) 130/59  132/62  Pulse: (!) 59 67  (!) 58  Resp: _0 Temp: 98.8 F (37.1 C)  98.4 F (36.9 C) 98.6 F (37 C)  TempSrc: Oral  Oral Oral  SpO2: 98% 100%  97%  Weight:      Height:  Intake/Output Summary (Last 24 hours) at 11/25/2019 1606 Last data filed at 11/25/2019  1340 Gross per 24 hour  Intake 440 ml  Output 400 ml  Net 40 ml   Last 3 Weights 11/24/2019 11/24/2019 11/05/2018  Weight (lbs) 144 lb 154 lb 155 lb  Weight (kg) 65.318 kg 69.854 kg 70.308 kg  Some encounter information is confidential and restricted. Go to Review Flowsheets activity to see all data.     Body mass index is 25.51 kg/m.  General:  Well nourished, well developed, in no acute distress HEENT: normal Lymph: no adenopathy Neck: no JVD Endocrine:  No thryomegaly Vascular: No carotid bruits; FA pulses 2+ bilaterally without bruits  Cardiac:  normal S1, S2; RRR; no murmur  Lungs:  clear to auscultation bilaterally, no wheezing, rhonchi or rales  Abd: soft, nontender, no hepatomegaly  Ext: no edema Musculoskeletal:  No deformities, BUE and BLE strength normal and equal Skin: warm and dry  Neuro:  CNs 2-12 intact, no focal abnormalities noted Psych:  Normal affect   EKG:  The EKG was personally reviewed and demonstrates:   Normal sinus rhythm, right bundle branch block  Telemetry:  Telemetry was personally reviewed and demonstrates: Normal sinus rhythm  Relevant CV Studies: Echocardiogram performed today Normal ejection fraction, normal RV function, no significant valve disease  Laboratory Data:  High Sensitivity Troponin:   Recent Labs  Lab 11/24/19 1337 11/24/19 1638 11/24/19 2045  TROPONINIHS _0 Chemistry Recent Labs  Lab 11/24/19 1337 11/25/19 0539  NA 138 144  K 3.8 4.0  CL 105 108  CO2 23 29  GLUCOSE 117* 85  BUN 21 17  CREATININE 1.24* 0.92  CALCIUM 9.4 9.0  GFRNONAA 45* >60  GFRAA 52* >60  ANIONGAP 10 7    No results for input(s): PROT, ALBUMIN, AST, ALT, ALKPHOS, BILITOT in the last 168 hours. Hematology Recent Labs  Lab 11/24/19 1337  WBC 8.3  RBC 4.37  HGB 14.1  HCT 39.5  MCV 90.4  MCH 32.3  MCHC 35.7  RDW 12.3  PLT 255   BNPNo results for input(s): BNP, PROBNP in the last 168 hours.  DDimer No results for input(s):  DDIMER in the last 168 hours.   Radiology/Studies:  DG Chest 2 View  Result Date: 11/24/2019 CLINICAL DATA:  Chest pain. Additional provided: Acute chest pain. Shortness of breath. EXAM: CHEST - 2 VIEW COMPARISON:  Prior chest radiographs 11/03/2017 and earlier FINDINGS: Prior median sternotomy. As before, there are fractures within multiple sternal cerclage wires. Coronary artery stents. Heart size within normal limits. No appreciable airspace consolidation within the lungs. No frank pulmonary edema. No evidence of pleural effusion or pneumothorax. No acute bony abnormality identified. IMPRESSION: No evidence of acute cardiopulmonary abnormality. Electronically Signed   By: Kellie Simmering DO   On: 11/24/2019 14:02   NM Myocar Multi W/Spect W/Wall Motion / EF  Result Date: 11/25/2019 Pharmacological myocardial perfusion imaging study with no significant  ischemia Normal wall motion, EF estimated at 64% No EKG changes concerning for ischemia at peak stress or in recovery. CT attenuation correction images with moderate three-vessel coronary calcification, mild aortic atherosclerosis low risk scan Signed, Esmond Plants, MD, Ph.D [facility]   {  Assessment and Plan:   1. CAD with chronic chronic stable angina Cardiac enzymes negative, EKG no significant changes, Echocardiogram normal ejection fraction Stress test with no significant ischemia normal ejection fraction, low risk scan --Long discussion with her, given the testing  above, she feels comfortable in going home today with follow-up with her primary cardiologist in 1 to 2 weeks time (has already been scheduled for the patient) -Declining refill of her nitro. -Would continue isosorbide 60 mg daily for chronic stable angina Also continue beta-blocker, aspirin, statin  2.  Anxiety/depression On Cymbalta Follow-up with primary care  3.  History of SVT No significant arrhythmia on monitor Continue beta-blocker, digoxin  4.   Hyperlipidemia High intensity Lipitor, goal LDL less than 70 This can be followed by primary cardiologist as outpatient  Long discussion with patient concerning above, Discharge planning started, for recurrent symptoms of unstable angina would recommend she come back to the hospital, might need cardiac catheterization We did suggest she take her nitroglycerin for any recurrent pain   Total encounter time more than 110 minutes  Greater than 50% was spent in counseling and coordination of care with the patient   For questions or updates, please contact [place] Please consult [email or web address] for contact info under    Signed, Ida Rogue, MD  11/25/2019 4:06 PM

## 2019-11-25 NOTE — Progress Notes (Signed)
*  PRELIMINARY RESULTS* Echocardiogram 2D Echocardiogram has been performed.  Lori Wagner Amaro Mangold 11/25/2019, 2:06 PM

## 2019-11-25 NOTE — Progress Notes (Signed)
Patient back from stress test. VSS. No complaints, diet verified with MD.

## 2019-11-25 NOTE — Discharge Instructions (Signed)

## 2019-11-25 NOTE — Progress Notes (Signed)
Patient given discharge instructions. IV taken out and tele monitor off. Husband picking up patient.

## 2019-11-25 NOTE — Plan of Care (Signed)

## 2019-11-25 NOTE — Discharge Summary (Signed)
Physician Discharge Summary  Angelle Talvitie Siple TKZ:[id number] DOB: [birth year]  PCP: [name], MD  Admitted from: Home Discharged to: Home  Admit date: 11/24/2019 Discharge date: 11/25/2019  Recommendations for Outpatient Follow-up:    Follow-up Information    Levonne Spiller, MD. Schedule an appointment as soon as possible for a visit.   Specialty: Cardiology Contact information: [address] [place] [address] [phone]        [name], MD. Schedule an appointment as soon as possible for a visit in 1 week(s).   Specialty: Family Medicine Contact information: [address] [place] [address] [phone]                Home Health: None Equipment/Devices: None  Discharge Condition: Improved and stable CODE STATUS: Full Diet recommendation: Heart healthy diet.  Discharge Diagnoses:  Principal Problem:   Chest pain Active Problems:   Acquired hypothyroidism   Clinical depression   Hyperlipidemia   Degeneration of intervertebral disc of lumbar region   SVT (supraventricular tachycardia) (HCC)   Constipation   CAD (coronary artery disease)   Brief Summary: 69 year old married female with PMH of CAD, remote CABG, followed by cardiology at [facility] health system, HTN, CKD, RA, SVT, PAD, hypothyroidism, depression, anxiety, chronic fatigue, presented to ED due to chest pain.  She periodically has stable angina for which she uses sublingual nitroglycerin.  She was in her usual state of health, was attending a woodworking class at [employer] on day of admission, was reportedly working hard and wanted to get her project completed, it was extremely hot and sweaty, periodically felt lightheaded and having to sit down.  At some point she developed chest discomfort with some associated dyspnea, chest pain moderate in intensity, nonradiating, felt similar to anginal symptoms in the  past.  This did not improve even after sitting in her air-conditioned car and had her spouse bring her to the ED.  She typically carries nitroglycerin with her but did not have it at that time.  Admitted for further evaluation and management.  Cardiology was consulted, ischemic work-up was negative.  Assessment and plan:  1. CAD with chronic stable angina: Cardiology was consulted.  EKG nonacute.  Troponin cycled and negative.  Chest pain resolved overnight without recurrence.  Stress test evaluated by cardiology and showed no significant ischemia, normal EF.  2D echo reviewed by cardiology and normal EF estimated greater than 55%, RV normal size with normal function and no significant valve disease mildly elevated right heart pressures estimated at 35 mmHg.  I evaluated patient at bedside along with Dr. Rockey Situ.  No further work-up planned at this time.  Also low index of suspicion for PE/aortic dissection.  No persistent chest pain, dyspnea, tachycardia or hypoxia.  No history of recent long distance travel or asymmetric painful legs.  Moreover patient declines any further testing including checking a D-dimer.  Cleared by cardiology for discharge home.  Patient reports that she has a follow-up appointment with her primary cardiologist upcoming in the next week or 2.  She was advised to keep that appointment.  As per RN, patient has ambulated comfortably in the floor without complaints.  Wonder if all her presentation was pertaining to excess heat and associated exhaustion.  Cardiology has also advised her to make sure to carry her sublingual NTG at all times.  Continue aspirin, statins and beta-blockers. 2. Hyperlipidemia: Continue high intensity statins and outpatient follow-up with cardiology.  LDL 59. 3. History of SVT: None noted on telemetry here.  Continue beta-blockers and digoxin.  Digoxin level 0.8. 4. Anxiety and depression, insomnia: Patient is on polypharmacy with some concern for duplication.   Recommend close follow-up with her PCP for further management. 5. Mild hypothyroidism: Continue Synthroid.  TSH normal.   Consultations:  Cardiology  Procedures:  None   Discharge Instructions  Discharge Instructions    Call MD for:  difficulty breathing, headache or visual disturbances   Complete by: As directed    Call MD for:  extreme fatigue   Complete by: As directed    Call MD for:  persistant dizziness or light-headedness   Complete by: As directed    Call MD for:  persistant nausea and vomiting   Complete by: As directed    Call MD for:  severe uncontrolled pain   Complete by: As directed    Diet - low sodium heart healthy   Complete by: As directed    Increase activity slowly   Complete by: As directed        Medication List    TAKE these medications   acetaminophen 500 MG tablet Commonly known as: TYLENOL Take 500-1,000 mg by mouth every 6 (six) hours as needed for mild pain, moderate pain or fever.   aspirin 81 MG tablet Take 162 mg by mouth daily.   atorvastatin 80 MG tablet Commonly known as: LIPITOR Take 80 mg by mouth at bedtime.   bisoprolol 5 MG tablet Commonly known as: ZEBETA Take 2.5 mg by mouth every evening.   buPROPion 200 MG 12 hr tablet Commonly known as: WELLBUTRIN SR Take 200 mg by mouth daily.   digoxin 0.125 MG tablet Commonly known as: LANOXIN Take 0.125 mg by mouth daily.   diphenhydramine-acetaminophen 25-500 MG Tabs tablet Commonly known as: TYLENOL PM Take 1 tablet by mouth at bedtime as needed (pain or sleep).   DULoxetine 60 MG capsule Commonly known as: CYMBALTA Take 1 capsule (60 mg total) by mouth at bedtime.   gabapentin 600 MG tablet Commonly known as: NEURONTIN Take 600 mg by mouth at bedtime.   isosorbide mononitrate 60 MG 24 hr tablet Commonly known as: IMDUR Take 60 mg by mouth daily.   levothyroxine 50 MCG tablet Commonly known as: SYNTHROID Take 50 mcg by mouth every evening.   MiraLax 17 g  packet Generic drug: polyethylene glycol Take 17 g by mouth daily as needed for mild constipation or moderate constipation.   nitroGLYCERIN 0.4 MG SL tablet Commonly known as: NITROSTAT Place 0.4 mg under the tongue every 5 (five) minutes as needed for chest pain.   traZODone 50 MG tablet Commonly known as: DESYREL Take 1-2 tablets (50-100 mg total) by mouth at bedtime.      Allergies  Allergen Reactions  . Epinephrine Other (See Comments)    Other Reaction: Other reaction-may go into SVT Pt states she wants this taken out.  . Sulfa Antibiotics Hives and Rash    Other Reaction: Other reaction  . Ace Inhibitors Cough  . Morphine And Related Nausea And Vomiting  . Penicillins Hives  . Cynara Scolymus ([place]) Hives      Procedures/Studies: DG Chest 2 View  Result Date: 11/24/2019 CLINICAL DATA:  Chest pain. Additional provided: Acute chest pain. Shortness of breath. EXAM: CHEST - 2 VIEW COMPARISON:  Prior chest radiographs 11/03/2017 and earlier FINDINGS: Prior median sternotomy. As before, there are fractures within multiple sternal cerclage wires. Coronary artery stents. Heart size  within normal limits. No appreciable airspace consolidation within the lungs. No frank pulmonary edema. No evidence of pleural effusion or pneumothorax. No acute bony abnormality identified. IMPRESSION: No evidence of acute cardiopulmonary abnormality. Electronically Signed   By: Kellie Simmering DO   On: 11/24/2019 14:02   NM Myocar Multi W/Spect W/Wall Motion / EF  Result Date: 11/25/2019 Pharmacological myocardial perfusion imaging study with no significant  ischemia Normal wall motion, EF estimated at 64% No EKG changes concerning for ischemia at peak stress or in recovery. CT attenuation correction images with moderate three-vessel coronary calcification, mild aortic atherosclerosis low risk scan Signed, Esmond Plants, MD, Ph.D [facility]      Subjective: Patient interviewed and examined along  with cardiologist at bedside.  Denies chest pain.  No recurrence since last night.  No dyspnea.  Denies history of recent long distance travel, leg swelling or pain.  Anxious to get out of the hospital and does not like to stay in the hospital.  Discharge Exam:  Vitals:   11/25/19 0750 11/25/19 1213 11/25/19 1214 11/25/19 1514  BP: 133/62 (!) 130/59  132/62  Pulse: (!) 59 67  (!) 58  Resp: 16 18  16   Temp: 98.8 F (37.1 C)  98.4 F (36.9 C) 98.6 F (37 C)  TempSrc: Oral  Oral Oral  SpO2: 98% 100%  97%  Weight:      Height:        General: Pleasant middle-aged female, moderately built and nourished lying comfortably propped up in bed without distress. Cardiovascular: S1 & S2 heard, RRR, S1/S2 +. No murmurs, rubs, gallops or clicks. No JVD or pedal edema.  Symmetric pulses well felt. Respiratory: Clear to auscultation without wheezing, rhonchi or crackles. No increased work of breathing. Abdominal:  Non distended, non tender & soft. No organomegaly or masses appreciated. Normal bowel sounds heard. CNS: Alert and oriented. No focal deficits. Extremities: no edema, no cyanosis    The results of significant diagnostics from this hospitalization (including imaging, microbiology, ancillary and laboratory) are listed below for reference.     Microbiology: Recent Results (from the past 240 hour(s))  SARS CORONAVIRUS 2 (TAT 6-24 HRS) Nasopharyngeal Nasopharyngeal Swab     Status: None   Collection Time: 11/24/19  5:05 PM   Specimen: Nasopharyngeal Swab  Result Value Ref Range Status   SARS Coronavirus 2 NEGATIVE NEGATIVE Final    Comment: (NOTE) SARS-CoV-2 target nucleic acids are NOT DETECTED.  The SARS-CoV-2 RNA is generally detectable in upper and lower respiratory specimens during the acute phase of infection. Negative results do not preclude SARS-CoV-2 infection, do not rule out co-infections with other pathogens, and should not be used as the sole basis for treatment or other  patient management decisions. Negative results must be combined with clinical observations, patient history, and epidemiological information. The expected result is Negative.  Fact Sheet for Patients: SugarRoll.be  Fact Sheet for Healthcare Providers: https://www.woods-mathews.com/  This test is not yet approved or cleared by the [place] FDA and  has been authorized for detection and/or diagnosis of SARS-CoV-2 by FDA under an Emergency Use Authorization (EUA). This EUA will remain  in effect (meaning this test can be used) for the duration of the COVID-19 declaration under Se ction 564(b)(1) of the Act, 21 U.S.C. section 360bbb-3(b)(1), unless the authorization is terminated or revoked sooner.  Performed at [facility], [place] [address] [place], [place] [address]      Labs: CBC: Recent Labs  Lab 11/24/19  1337  WBC 8.3  HGB 14.1  HCT 39.5  MCV 90.4  PLT 546    Basic Metabolic Panel: Recent Labs  Lab 11/24/19 1337 11/25/19 0539  NA 138 144  K 3.8 4.0  CL 105 108  CO2 23 29  GLUCOSE 117* 85  BUN 21 17  CREATININE 1.24* 0.92  CALCIUM 9.4 9.0  MG  --  2.1    Lipid Profile Recent Labs    11/24/19 1638  CHOL 166  HDL 69  LDLCALC 59  TRIG 192*  CHOLHDL 2.4    Thyroid function studies Recent Labs    11/25/19 0539  TSH 3.048      Time coordinating discharge: 25 minutes  SIGNED:  Vernell Leep, MD, FACP, [facility]. [facility]  To contact the attending provider between 7A-7P or the covering provider during after hours 7P-7A, please log into the web site [email or web address] and access using universal [place] password for that web site. If you do not have the password, please call the hospital operator.

## 2020-01-12 ENCOUNTER — Other Ambulatory Visit: Payer: Medicare Other

## 2020-01-12 ENCOUNTER — Other Ambulatory Visit: Payer: Self-pay

## 2020-01-12 DIAGNOSIS — Z20822 Contact with and (suspected) exposure to covid-19: Secondary | ICD-10-CM

## 2020-01-13 LAB — SARS-COV-2, NAA 2 DAY TAT

## 2020-01-13 LAB — NOVEL CORONAVIRUS, NAA: SARS-CoV-2, NAA: NOT DETECTED

## 2020-01-19 ENCOUNTER — Other Ambulatory Visit: Payer: Self-pay

## 2020-01-19 ENCOUNTER — Other Ambulatory Visit: Payer: Medicare Other

## 2020-01-19 DIAGNOSIS — Z20822 Contact with and (suspected) exposure to covid-19: Secondary | ICD-10-CM

## 2020-01-21 LAB — SARS-COV-2, NAA 2 DAY TAT

## 2020-01-21 LAB — NOVEL CORONAVIRUS, NAA: SARS-CoV-2, NAA: NOT DETECTED

## 2020-05-29 ENCOUNTER — Other Ambulatory Visit: Payer: Self-pay | Admitting: Family Medicine

## 2020-05-29 DIAGNOSIS — Z1231 Encounter for screening mammogram for malignant neoplasm of breast: Secondary | ICD-10-CM

## 2020-06-29 ENCOUNTER — Ambulatory Visit
Admission: RE | Admit: 2020-06-29 | Discharge: 2020-06-29 | Disposition: A | Discharge status: Home / Self Care | Payer: Medicare Other | Source: Ambulatory Visit | Attending: Family Medicine | Admitting: Family Medicine

## 2020-06-29 ENCOUNTER — Other Ambulatory Visit: Payer: Self-pay

## 2020-06-29 DIAGNOSIS — Z1231 Encounter for screening mammogram for malignant neoplasm of breast: Secondary | ICD-10-CM | POA: Diagnosis not present

## 2020-09-14 ENCOUNTER — Other Ambulatory Visit: Payer: Self-pay

## 2020-09-14 ENCOUNTER — Ambulatory Visit: Payer: Medicare Other | Admitting: Dermatology

## 2020-09-14 DIAGNOSIS — I8310 Varicose veins of unspecified lower extremity with inflammation: Secondary | ICD-10-CM

## 2020-09-14 DIAGNOSIS — L821 Other seborrheic keratosis: Secondary | ICD-10-CM

## 2020-09-14 DIAGNOSIS — L814 Other melanin hyperpigmentation: Secondary | ICD-10-CM

## 2020-09-14 DIAGNOSIS — L570 Actinic keratosis: Secondary | ICD-10-CM

## 2020-09-14 DIAGNOSIS — Z1283 Encounter for screening for malignant neoplasm of skin: Secondary | ICD-10-CM

## 2020-09-14 DIAGNOSIS — D229 Melanocytic nevi, unspecified: Secondary | ICD-10-CM

## 2020-09-14 DIAGNOSIS — L578 Other skin changes due to chronic exposure to nonionizing radiation: Secondary | ICD-10-CM | POA: Diagnosis not present

## 2020-09-14 DIAGNOSIS — D1800 Hemangioma unspecified site: Secondary | ICD-10-CM

## 2020-09-14 NOTE — Patient Instructions (Addendum)
If you have any questions or concerns for your doctor, please call our main line at [phone] and press option 4 to reach your doctor's medical assistant. If no one answers, please leave a voicemail as directed and we will return your call as soon as possible. Messages left after 4 pm will be answered the following business day.   You may also send [place] a message via [place]. We typically respond to MyChart messages within 1-2 business days.  For prescription refills, please ask your pharmacy to contact our office. Our fax number is [phone].  If you have an urgent issue when the clinic is closed that cannot wait until the next business day, you can page your doctor at the number below.    Please note that while we do our best to be available for urgent issues outside of office hours, we are not available 24/7.   If you have an urgent issue and are unable to reach [place], you may choose to seek medical care at your doctor's office, retail clinic, urgent care center, or emergency room.  If you have a medical emergency, please immediately call 911 or go to the emergency department.  Pager Numbers  - Dr. Nehemiah Massed: [phone]  - Dr. Laurence Ferrari: [phone]  - Dr. Nicole Kindred: [phone]  In the event of inclement weather, please call our main line at [phone] for an update on the status of any delays or closures.  Dermatology Medication Tips: Please keep the boxes that topical medications come in in order to help keep track of the instructions about where and how to use these. Pharmacies typically print the medication instructions only on the boxes and not directly on the medication tubes.   If your medication is too expensive, please contact our office at [phone] option 4 or send [place] a message through [place].   We are unable to tell what your co-pay for medications will be in advance as this is different depending on your insurance coverage. However, we may be able to find a substitute  medication at lower cost or fill out paperwork to get insurance to cover a needed medication.   If a prior authorization is required to get your medication covered by your insurance company, please allow [place] 1-2 business days to complete this process.  Drug prices often vary depending on where the prescription is filled and some pharmacies may offer cheaper prices.  The website www.goodrx.com contains coupons for medications through different pharmacies. The prices here do not account for what the cost may be with help from insurance (it may be cheaper with your insurance), but the website can give you the price if you did not use any insurance.  - You can print the associated coupon and take it with your prescription to the pharmacy.  - You may also stop by our office during regular business hours and pick up a GoodRx coupon card.  - If you need your prescription sent electronically to a different pharmacy, notify our office through [facility] or by phone at [phone] option 4.   Actinic Keratosis  What is an actinic keratosis? An actinic keratosis (plural: actinic keratoses) is growth on the surface of the skin that usually appears as a red, hard, crusty or scaly bump.  What causes actinic keratoses? Repeated prolonged sun exposure causes skin damage, especially in fair-skinned persons. Sun-damaged skin becomes dry and wrinkled and may form rough, scaly spots called actinic keratoses. These rough spots remain on the skin even though the  crust or scale on top is picked off.  Why treat actinic keratoses? Actinic keratoses are not skin cancers, but because they may sometimes turn cancerous they are called "pre-cancerous". Not all will turn to skin cancer, and it usually takes several years for this to happen. Because it is much easier to treat an actinic keratosis then it is to remove a skin cancer, actinic keratoses should be treated to prevent future skin cancer.  How are  actinic keratoses treated? The most common way of treating actinic keratoses is to freeze them with liquid nitrogen. Freezing causes scabbing and shedding of the sun-damaged skin. Healing after a removal usually takes two weeks, depending on the size and location of the keratosis. Hands and legs heal more slowly than the face. The skin's final appearance is usually excellent. There are several topical medications that can be used to treat actinic keratoses. These medications generally have side effects of redness, crusting, and pain.Some are used for a few days, and some for several months before the actinic keratosis is completely gone. Photodynamic therapy is another alternative to freezing actinic keratoses.This treatment is done in a physician's office.A medication is applied to the area of skin with actinic keratoses, and it is allowed to soak in for one or more hours. A special light is then applied to the skin.Side effects include redness, burning, and peeling.  How can you prevent actinic keratoses? Protection from the sun is the best way to prevent actinic keratoses.The use of proper clothing and sunscreens can prevent the sun damage that leads to an actinic keratosis. Unfortunately, some sun damage is permanent. Once sun damage has progressed to the point where actinic keratoses develop, new keratoses may appear even without further sun exposure. However, even in skin that is already heavily sun damaged, good sun protection can help reduce the number of actinic keratoses that will appear.

## 2020-09-14 NOTE — Progress Notes (Signed)
New Patient Visit  Subjective  Lori Wagner is a 70 y.o. female who presents for the following: check spot (Nose, pt not sure how long it has been there, will bleed when rubbed hard) and total body skin exam (No hx of skin ca). The patient presents for Total-Body Skin Exam (TBSE) for skin cancer screening and mole check.  New patient referral from Dr. [name].  The following portions of the chart were reviewed this encounter and updated as appropriate:   Tobacco  Allergies  Meds  Problems  Med Hx  Surg Hx  Fam Hx     Review of Systems:  No other skin or systemic complaints except as noted in HPI or Assessment and Plan.  Objective  Well appearing patient in no apparent distress; mood and affect are within normal limits.  A full examination was performed including scalp, head, eyes, ears, nose, lips, neck, chest, axillae, abdomen, back, buttocks, bilateral upper extremities, bilateral lower extremities, hands, feet, fingers, toes, fingernails, and toenails. All findings within normal limits unless otherwise noted below.  Objective  L mid dorsum nose x 1, glabella x 1, L forehead x 1 (3): Pink scaly macules    Assessment & Plan  AK (actinic keratosis) (3) L mid dorsum nose x 1, glabella x 1, L forehead x 1  Destruction of lesion - L mid dorsum nose x 1, glabella x 1, L forehead x 1 Complexity: simple   Destruction method: cryotherapy   Informed consent: discussed and consent obtained   Timeout:  patient name, date of birth, surgical site, and procedure verified Lesion destroyed using liquid nitrogen: Yes   Region frozen until ice ball extended beyond lesion: Yes   Outcome: patient tolerated procedure well with no complications   Post-procedure details: wound care instructions given    Skin cancer screening   Lentigines - Scattered tan macules - Due to sun exposure - Benign-appering, observe - Recommend daily broad spectrum sunscreen SPF 30+ to  sun-exposed areas, reapply every 2 hours as needed. - Call for any changes  Seborrheic Keratoses - Stuck-on, waxy, tan-brown papules and/or plaques  - Benign-appearing - Discussed benign etiology and prognosis. - Observe - Call for any changes  Melanocytic Nevi - Tan-brown and/or pink-flesh-colored symmetric macules and papules - Benign appearing on exam today - Observation - Call clinic for new or changing moles - Recommend daily use of broad spectrum spf 30+ sunscreen to sun-exposed areas.   Hemangiomas - Red papules - Discussed benign nature - Observe - Call for any changes  Actinic Damage - Chronic condition, secondary to cumulative UV/sun exposure - diffuse scaly erythematous macules with underlying dyspigmentation - Recommend daily broad spectrum sunscreen SPF 30+ to sun-exposed areas, reapply every 2 hours as needed.  - Staying in the shade or wearing long sleeves, sun glasses (UVA+UVB protection) and wide brim hats (4-inch brim around the entire circumference of the hat) are also recommended for sun protection.  - Call for new or changing lesions.  Skin cancer screening performed today.  Varicose Veins - Dilated blue, purple or red veins at the lower extremities - Reassured - These can be treated by sclerotherapy (a procedure to inject a medicine into the veins to make them disappear) if desired, but the treatment is not covered by insurance  Return in about 2 months (around 11/14/2020) for recheck AKs .  I, Othelia Pulling, RMA, am acting as scribe for Sarina Ser, MD .  Documentation: I have reviewed the above documentation  for accuracy and completeness, and I agree with the above.  Sarina Ser, MD

## 2020-09-15 ENCOUNTER — Other Ambulatory Visit: Payer: Self-pay | Admitting: Otolaryngology

## 2020-09-15 DIAGNOSIS — M542 Cervicalgia: Secondary | ICD-10-CM

## 2020-09-19 ENCOUNTER — Encounter: Payer: Self-pay | Admitting: Dermatology

## 2020-10-11 ENCOUNTER — Ambulatory Visit
Admission: RE | Admit: 2020-10-11 | Discharge: 2020-10-11 | Disposition: A | Discharge status: Home / Self Care | Payer: Medicare Other | Source: Ambulatory Visit | Attending: Otolaryngology | Admitting: Otolaryngology

## 2020-10-11 ENCOUNTER — Other Ambulatory Visit: Payer: Self-pay

## 2020-10-11 DIAGNOSIS — M542 Cervicalgia: Secondary | ICD-10-CM | POA: Insufficient documentation

## 2020-10-13 ENCOUNTER — Other Ambulatory Visit: Payer: Self-pay | Admitting: Otolaryngology

## 2020-10-13 DIAGNOSIS — R590 Localized enlarged lymph nodes: Secondary | ICD-10-CM

## 2020-10-16 ENCOUNTER — Other Ambulatory Visit: Payer: Self-pay | Admitting: Radiology

## 2020-10-16 NOTE — Progress Notes (Signed)
Patient on schedule for Submandibular LN biopsy  10/18/2020. Spoke with patient on phone with Pre procedure instructions given. Made aware to be here @ 0800, NPO after MN prior to procedure since patient wants sedation, and driver post procedure/recovery/discharge. Stated understanding.

## 2020-10-17 ENCOUNTER — Other Ambulatory Visit: Payer: Self-pay | Admitting: Radiology

## 2020-10-18 ENCOUNTER — Ambulatory Visit
Admission: RE | Admit: 2020-10-18 | Discharge: 2020-10-18 | Disposition: A | Discharge status: Home / Self Care | Payer: Medicare Other | Source: Ambulatory Visit | Attending: Otolaryngology | Admitting: Otolaryngology

## 2020-10-18 ENCOUNTER — Other Ambulatory Visit: Payer: Self-pay

## 2020-10-18 ENCOUNTER — Other Ambulatory Visit: Payer: Self-pay | Admitting: Interventional Radiology

## 2020-10-18 DIAGNOSIS — R590 Localized enlarged lymph nodes: Secondary | ICD-10-CM | POA: Insufficient documentation

## 2020-10-18 DIAGNOSIS — R0989 Other specified symptoms and signs involving the circulatory and respiratory systems: Secondary | ICD-10-CM

## 2020-10-18 MED ORDER — SODIUM CHLORIDE 0.9 % IV SOLN: INTRAVENOUS | Status: DC

## 2020-10-18 MED ORDER — MIDAZOLAM HCL 2 MG/2ML IJ SOLN
INTRAMUSCULAR | Status: AC
  Filled 2020-10-18: qty 2

## 2020-10-18 MED ORDER — FENTANYL CITRATE (PF) 100 MCG/2ML IJ SOLN
INTRAMUSCULAR | Status: AC
  Filled 2020-10-18: qty 2

## 2020-10-18 NOTE — Progress Notes (Signed)
Dr. Dwaine Gale at bedside and discussed with patient that he spoke with ordering MD for procedure that Dr. Aurea Graff recommendation is to cancel the scheduled biopsy and recommends imaging with contrast.  Patient's questions were answered.  PIV removed and patient left with husband and states she will follow up with ordering provider. In addition to this conversation, noted patient to have sinus bradycardia 45-38 bpm and patient states has been working with cardiologist with medications and will contact cardiologist regarding sinus bradycardia noted today.

## 2020-10-20 ENCOUNTER — Other Ambulatory Visit: Payer: Self-pay | Admitting: Otolaryngology

## 2020-10-20 DIAGNOSIS — K1230 Oral mucositis (ulcerative), unspecified: Secondary | ICD-10-CM

## 2020-10-29 ENCOUNTER — Ambulatory Visit
Admission: RE | Admit: 2020-10-29 | Discharge: 2020-10-29 | Disposition: A | Discharge status: Home / Self Care | Payer: Medicare Other | Source: Ambulatory Visit | Attending: Otolaryngology | Admitting: Otolaryngology

## 2020-10-29 ENCOUNTER — Other Ambulatory Visit: Payer: Self-pay

## 2020-10-29 DIAGNOSIS — R6884 Jaw pain: Secondary | ICD-10-CM | POA: Diagnosis not present

## 2020-10-29 DIAGNOSIS — Z8619 Personal history of other infectious and parasitic diseases: Secondary | ICD-10-CM | POA: Insufficient documentation

## 2020-10-29 DIAGNOSIS — K1230 Oral mucositis (ulcerative), unspecified: Secondary | ICD-10-CM | POA: Insufficient documentation

## 2020-10-29 MED ORDER — GADOBUTROL 1 MMOL/ML IV SOLN
6.0000 mL | Freq: Once | INTRAVENOUS | Status: AC | PRN
  Administered 2020-10-29: 6 mL via INTRAVENOUS

## 2020-11-01 ENCOUNTER — Other Ambulatory Visit: Payer: Self-pay

## 2020-11-01 ENCOUNTER — Encounter: Payer: Self-pay | Admitting: Physical Therapy

## 2020-11-01 ENCOUNTER — Ambulatory Visit: Payer: Medicare Other | Attending: Otolaryngology | Admitting: Physical Therapy

## 2020-11-01 DIAGNOSIS — R42 Dizziness and giddiness: Secondary | ICD-10-CM | POA: Diagnosis present

## 2020-11-01 NOTE — Therapy (Signed)
[place] MAIN [facility] SERVICES [address] [place], [place], [address] Phone: [phone]   Fax:  [phone]  Physical Therapy Evaluation  Patient Details  Name: Lori Wagner MRN: [id number] Date of Birth: 07/08/1950 No data recorded  Encounter Date: 11/01/2020   PT End of Session - 11/01/20 1350     Visit Number 1    Number of Visits 9    Date for PT Re-Evaluation 12/27/20    PT Start Time 1347    PT Stop Time 1440    PT Time Calculation (min) 53 min    Equipment Utilized During Treatment Gait belt    Activity Tolerance Patient tolerated treatment well    Behavior During Therapy WFL for tasks assessed/performed             Past Medical History:  Diagnosis Date   Anxiety    Arthritis    knees,hip , back and hands   Chronic kidney disease    Hx of kidney stones, years ago   Coronary artery disease    Dysrhythmia    SVT- controlled with Beta blocker   Family history of bone cancer    Family history of lung cancer    Family history of ovarian cancer    Fatigue    HOH (hard of hearing)    tinnitis both ears   Hypertension     can cause lightheadedness   Motion sickness    Neuromuscular disorder (HCC)    carpal tunnel both hands   PONV (postoperative nausea and vomiting)    Post herpetic neuralgia    left side of face   RA (rheumatoid arthritis) ([place])    Shingles 02/2017   left side of face   Sleep apnea    Hx of/ when overweight, no CPAP   SVT (supraventricular tachycardia) (HCC)    Thyroid disease    HX OF/ NO LONGER TAKING THYROID MEDS, THYROID TESTS CAME BACK NORMAL   Vertigo    Hx of    Past Surgical History:  Procedure Laterality Date   ANKLE SURGERY Left    APPENDECTOMY     BLADDER REPAIR     CARDIAC CATHETERIZATION     CARDIAC SURGERY     CATARACT EXTRACTION W/PHACO Left 06/03/2017   Procedure: CATARACT EXTRACTION PHACO AND INTRAOCULAR LENS PLACEMENT ([place])  LEFT;  Surgeon: Eulogio Bear,  MD;  Location: [place];  Service: Ophthalmology;  Laterality: Left;   CATARACT EXTRACTION W/PHACO Right 07/08/2017   Procedure: CATARACT EXTRACTION PHACO AND INTRAOCULAR LENS PLACEMENT ([place]) RIGHT;  Surgeon: Eulogio Bear, MD;  Location: [place];  Service: Ophthalmology;  Laterality: Right;   COLONOSCOPY N/A 01/08/2017   Procedure: COLONOSCOPY;  Surgeon: [name], MD;  Location: [facility] ENDOSCOPY;  Service: Gastroenterology;  Laterality: N/A;   CORONARY ARTERY BYPASS GRAFT  2001   x4   GASTRIC BYPASS     SLEEVE   HAND SURGERY Right    2 TRIGGER FINGERS   KNEE ARTHROSCOPY Right    renal colic     TRIGGER FINGER RELEASE Right 01/17/2016   Procedure: RELEASE TRIGGER FINGER/A-1 PULLEY THUMB;  Surgeon: Corky Mull, MD;  Location: [place];  Service: Orthopedics;  Laterality: Right;    There were no vitals filed for this visit.     Subjective Assessment - 11/03/20 1005     Subjective Patient states that her dizziness and imbalance symptoms began when she had shingles on the left side  of her face in 2018. Patient states that she tries to avoid looking down as this brings on her symptoms and this makes it difficult to train and walk her dog.    Pertinent History History obtained from MR and patient report. Patient reports in the fall of 2018, she had shingles in her left ear that affected her face, left mandible, half her tongue and left ear that went undiagnosed for awhile. Patient reports she had significant dizziness and she could not stand up without falling down. Patient states she was sick for 6 weeks and states she has had posttraumatic problems ever since. Patient reports that she has seen Dr. Pryor Ochoa, ENT physician, in regards to her symptoms. Patient states that Dr. Pryor Ochoa did some tests that revealed damage to the left ear and some central nervous symptoms as well. Patient reports that she is getting daily episodes of dizziness and reports that  she has modified her movements and adopted strategies to try to avoid bringing on her symptoms of dizziness. Patient states she trained herself to look at the horizon as looking down brings her symptoms and causes imbalance. Patient reports that looking down, quick turns and head turns provoke her symptoms and patient reports that staying vertical and focusing on a vertical target as well as touching surfaces as needed will help to decrease her symptoms. Patient reports that she veers to the right when she walks at times. Patient describes her dizziness as it feels like suddenly she is going in the wrong direction, vertigo, unsteadiness, aural fullness, and lightheadedness, which she states also happens if her blood pressure drops. Patient reports the dizziness lasts seconds. Patient's dizziness symptoms are motion provoked, positional, and intermittent. Patient reports her vestibular issues have not been as bad in the past few months. Patient reports that she likes to train her dog, but states that if she looks down to train the dog this will cause loss of balance. Patient's goal is to be able to look down at her dog without dizziness and imbalance. Patient reports that she no longer climbs ladders and that she does not ascend/descend steps without using a rail now due to her symptoms. 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    Diagnostic tests CT neck revealed Single enlarged left submandibular lymph node. No visible underlying  inflammation or mass. 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    Patient Stated Goals Patient reports she would like to be able to look down at her dog without dizziness and imbalance.    Currently in Pain? --   none stated                 [facility] PT Assessment - 11/03/20 1010       Assessment   Medical  Diagnosis Dizziness and giddiness    Referring Provider (PT) Dr. Loletha Grayer Vaught    Onset Date/Surgical Date --   October 2018   Prior Therapy no prior vestibular therapy      Precautions   Precautions None      Restrictions   Weight Bearing Restrictions No      Balance Screen   Has the patient fallen in the past 6 months Yes    How many times? 1   mechanical fall   Has the patient had a decrease  in activity level because of a fear of falling?  No    Is the patient reluctant to leave their home because of a fear of falling?  No      Home Environment   Living Environment Private residence    Available Help at Discharge Family    Type of [place] to enter      Prior Function   Level of Independence Independent with community mobility without device;Independent with basic ADLs;Independent with gait;Independent with transfers    Leisure patient likes to do woodworking and to train and walk her dog      Cognition   Overall Cognitive Status Within Functional Limits for tasks assessed      Standardized Balance Assessment   Standardized Balance Assessment Dynamic Gait Index      Dynamic Gait Index   Level Surface Normal    Change in Gait Speed Normal    Gait with Horizontal Head Turns Normal    Gait with Vertical Head Turns Mild Impairment    Gait and Pivot Turn Normal   turning left increased sway but no steps and recreated dizziness/imbalance sensation   Step Over Obstacle Normal    Step Around Obstacles Normal    Steps Mild Impairment    Total Score 22              VESTIBULAR AND BALANCE EVALUATION   HISTORY:  Subjective history of current problem: History obtained from MR and patient report. Patient reports in the fall of 2018, she had shingles in her left ear that affected her face, left mandible, half her tongue and left ear that went undiagnosed for awhile. Patient reports she had significant dizziness and she could not stand up without falling  down. Patient states she was sick for 6 weeks and states she has had posttraumatic problems ever since. Patient reports that she has seen Dr. Pryor Ochoa, ENT physician, in regards to her symptoms. Patient states that Dr. Pryor Ochoa did some tests that revealed damage to the left ear and some central nervous symptoms as well. Patient reports that she is getting daily episodes of dizziness and reports that she has modified her movements and adopted strategies to try to avoid bringing on her symptoms of dizziness. Patient states she trained herself to look at the horizon as looking down brings her symptoms and causes imbalance. Patient reports that looking down, quick turns and head turns provoke her symptoms and patient reports that staying vertical and focusing on a vertical target as well as touching surfaces as needed will help to decrease her symptoms. Patient reports that she veers to the right when she walks at times. Patient describes her dizziness as it feels like suddenly she is going in the wrong direction, vertigo, unsteadiness, aural fullness, and lightheadedness, which she states also happens if her blood pressure drops. Patient reports the dizziness lasts seconds. Patient's dizziness symptoms are motion provoked, positional, and intermittent. Patient reports her vestibular issues have not been as bad in the past few months. Patient reports that she likes to train her dog, but states that if she looks down to train the dog this will cause loss of balance. Patient's goal is to be able to look down at her dog without dizziness and imbalance. Patient reports that she no longer climbs ladders and that she does not ascend/descend steps without using a rail now due to her symptoms. 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   IMAGING: EXAM: MRI FACE TRIGEMINAL WITHOUT AND WITH CONTRAST FINDINGS: Limited intracranial/Trigeminal nerves: Limited portions of the brain appear normal  with exception of some chronic small-vessel ischemic change of the pons. No evidence of atrophy, likely acute infarction, intra-axial mass lesion, hemorrhage, hydrocephalus or extra-axial collection. Both fifth nerves appear normal. On the right, there is a small vessel adjacent to the root entry zone, possibly a superior cerebellar artery branch. No vessel seen at the left root entry zone. Meckel's cave regions appear normal. Infratemporal fossa region appears normal. Other regional cranial nerves appear normal. IMPRESSION: There does appear to be a vessel in contact with the root entry zone of the trigeminal nerve on the right, probably a branch of the superior cerebellar artery.  EXAM: CT NECK WITHOUT CONTRAST IMPRESSION: Single enlarged left submandibular lymph node. No visible underlying inflammation or mass.  Description of dizziness: pt states it feels like suddenly she is going in the wrong direction, vertigo, unsteadiness, lightheadedness (also happens with blood pressure drops), falling, aural fullness  Frequency: daily Duration: seconds Symptom nature: motion provoked, positional, intermittent  Provocative Factors: quick turns, head turns, looking down Easing Factors: staying on vertical and focusing on a vertical target, touching surfaces as needed  Progression of symptoms: better History of similar episodes: none before 2018  Falls (yes/no): no Number of falls in past 6 months: reports one near miss while training the dog.   Auditory complaints (tinnitus, pain, drainage): denies drainage Vision (last eye exam, diplopia, recent changes): denies any recent vision changes  Current Symptoms: (dysarthria, dysphagia, drop attacks, bowel and bladder changes, recent weight loss/gain)    Review of systems negative for red flags.     EXAMINATION      MUSCULOSKELETAL SCREEN: Cervical Spine ROM: AROM cervical spine WFL flexion, extension, right and left  rotation.  Gait: Patient arrives ambulating without AD. Patient ambulates with fair cadence with reciprocal arm swing with step through gait pattern.  Scanning of visual environment with gait is: fair  Balance: Patient is challenged by eyes closed, uneven surfaces, narrow base of support, and activities with head and body turns.   POSTURAL CONTROL TESTS:  Clinical Test of Sensory Interaction for Balance (CTSIB):  CONDITION TIME STRATEGY SWAY  Eyes open, firm surface 30 seconds ankle +1  Eyes closed, firm surface 30 seconds ankle +1  Eyes open, foam surface 30 seconds ankle +2  Eyes closed, foam surface 30 seconds Ankle, hip +3    OCULOMOTOR / VESTIBULAR TESTING: Oculomotor Exam- Room Light  Normal Abnormal Comments  Ocular Alignment N    Ocular ROM N    Spontaneous Nystagmus N    Gaze evoked Nystagmus N    Smooth Pursuit  Abn Few saccadic movements noted; pt reports mild symptoms  Saccades  Abn Very mild hypometric saccades noted all fields  VOR  Abn   VOR Cancellation N    Left Head Impulse  Abn   Right Head Impulse N    Head Shaking Nystagmus   deferred   BPPV TESTS: deferred  FUNCTIONAL OUTCOME MEASURES:  Results Comments  DHI 26/100 Low perception of handicap  ABC Scale 82.8% Safe for community mobility  DGI 22/24 Safe for community mobility  FOTO 48/100 Given the patient's risk adjustment variables, like-patients nationally had a FS score of  56/100 at intake; in need of intervention  Dizziness Functional Status (DFS) 61.7 Range (27-67)   VOR X 1 exercise: Demonstrated and educated as to VOR X1.  Patient performed VOR X 1 horizontal in sitting 3 reps of 1 minute each with verbal cues for technique.  Patient reports the target is staying in focus and reports /10 dizziness.  Patient performed VOR X 1 horizontal in standing 3 reps of 1 minute each with verbal cues for technique.   MedBridge: Access Code: [email or web address] URL: [email or web address] Date:  11/01/2020 Prepared by: Lady Deutscher  Exercises Seated VOR X1 exercise - 3 x daily - 7 x weekly - 1 sets - 3 reps - 1 minute hold    PT Education - 11/01/20 1705     Education Details discussed plan of care and goals; issued VOR X 1 horizontal in sitting for HEP via [place]    Person(s) Educated Patient    Methods Explanation;Demonstration;Verbal cues    Comprehension Verbalized understanding;Returned demonstration               PT Short Term Goals - 11/03/20 0915       PT SHORT TERM GOAL #1   Title Pt will be independent with HEP in order to improve balance and decrease dizziness symptoms in order to improve function at home and in the community.    Time 4    Period Weeks    Status New    Target Date 11/30/20                PT Long Term Goals - 11/01/20 1350       PT LONG TERM GOAL #1   Title Patient will have improved FOTO score of 6 points or greater in order to demonstrate improvements in patient's ADLs and functional performance.    Baseline scored 48/100 on 11/01/20;    Time 8    Period Weeks    Status New    Target Date 12/27/20      PT LONG TERM GOAL #2   Title Patient will report 50% or greater improvement in her symptoms of dizziness and imbalance with provoking motions or positions.    Baseline pt reports dizziness with quick turns, heada turns and bending forward    Time 8    Period Weeks    Status New    Target Date 12/27/20      PT LONG TERM GOAL #3   Title Patient will be able to train and walk her dog without loss of balance when she looks down or bends over.    Baseline on 11/01/20, pt reports that she avoids looking down and bending over as this causes loss of balance;    Time 8    Period Weeks    Status New    Target Date 12/27/20                    Plan - 11/03/20 1025     Clinical Impression Statement Patient presents to the clinic with reports that she began to have sudden onset of dizziness in October 2018 when she  had shingles that affected the left side of her face and ear. Patient reports that she continues to have daily episodes of dizziness and imbalance, but states she modfies her movements to try to decrease these episodes. Patient with potential indicators of central and peripheral signs with vestibular ocular motor testing. Patient scored 48/100 on the FOTO outcome measure with like-patients nationally had a score of 56/100. Patient would benefit from vestibular PT services to try to address functional deficits and goals as set on plan of care.    Personal Factors  and Comorbidities Comorbidity 3+    Comorbidities Shingles left face/ear 2018, sinusitis, CAD, hypotension, hypothyrodism, clinical depression, chronic fatigue and immune dysfunction syndrome    Examination-Activity Limitations [place]   looking down, quick turns and head turns   Examination-Participation Restrictions Other   training her dog   Stability/Clinical Decision Making Evolving/Moderate complexity    Rehab Potential Good    PT Frequency 1x / week    PT Duration 8 weeks    PT Treatment/Interventions [place] Repostioning;Gait training;Stair training;Therapeutic exercise;Balance training;Neuromuscular re-education;Therapeutic activities;Vestibular;Patient/family education    PT Next Visit Plan review HEP, work on feet together and semitandem progressions with head turns on firm and foam, amb while scanning for targets    PT Home Exercise Plan Access Code: [email or web address]  URL: [email or web address]  Date: 11/01/2020  Prepared by: Lady Deutscher    Exercises  Seated VOR X1 exercise - 3 x daily - 7 x weekly - 1 sets - 3 reps - 1 minute hold    Consulted and Agree with Plan of Care Patient             Patient will benefit from skilled therapeutic intervention in order to improve the following deficits and impairments:  Decreased balance,Dizziness  Visit Diagnosis: Dizziness and giddiness     Problem List Patient Active  Problem List   Diagnosis Date Noted   Chest pain 11/24/2019   Genetic testing 02/02/2019   Family history of ovarian cancer    Family history of bone cancer    Family history of lung cancer    CAD (coronary artery disease) 07/03/2017   Hypotension 07/03/2017   Proctitis 01/07/2017   Constipation    LLQ abdominal pain    Abnormal CT of the abdomen    Tremor 08/07/2016   SVT (supraventricular tachycardia) ([place]) 06/25/2016   Trigger finger of right thumb 01/18/2016   Carpal tunnel syndrome 10/13/2015   Bilateral carpal tunnel syndrome 10/13/2015   Arthralgia of multiple joints 07/03/2015   CFIDS (chronic fatigue and immune dysfunction syndrome) ([place]) 07/03/2015   Degeneration of intervertebral disc of lumbar region 07/03/2015   Hand paresthesia 07/03/2015   Major depressive disorder, recurrent, severe without psychotic features ([place]) 12/21/2014   Panic disorder 12/21/2014   Impingement syndrome of shoulder 07/22/2014   Infraspinatus tenosynovitis 07/22/2014   Impingement syndrome of left shoulder 07/22/2014   Other synovitis and tenosynovitis, left shoulder 07/22/2014   Left supraspinatus tenosynovitis 07/22/2014   Acquired hypothyroidism 07/12/2014   Pain in shoulder 07/12/2014   Clinical depression 07/12/2014   Hyperlipidemia 07/12/2014   Lady Deutscher PT, DPT [phone] Lady Deutscher 11/01/2020, 5:11 PM  [place] MAIN [facility] SERVICES [address] [place], [place], [address] Phone: [phone]   Fax:  [phone]  Name: Lori Wagner MRN: [id number] Date of Birth: 1950-09-03

## 2020-11-02 ENCOUNTER — Other Ambulatory Visit: Payer: Self-pay

## 2020-11-02 ENCOUNTER — Ambulatory Visit: Payer: Medicare Other | Admitting: Dermatology

## 2020-11-02 DIAGNOSIS — L738 Other specified follicular disorders: Secondary | ICD-10-CM

## 2020-11-02 DIAGNOSIS — L578 Other skin changes due to chronic exposure to nonionizing radiation: Secondary | ICD-10-CM | POA: Diagnosis not present

## 2020-11-02 DIAGNOSIS — L821 Other seborrheic keratosis: Secondary | ICD-10-CM | POA: Diagnosis not present

## 2020-11-02 DIAGNOSIS — L570 Actinic keratosis: Secondary | ICD-10-CM

## 2020-11-02 NOTE — Patient Instructions (Signed)

## 2020-11-02 NOTE — Progress Notes (Signed)
   Follow-Up Visit   Subjective  Lori Wagner is a 70 y.o. female who presents for the following: Ak f/u (L mid dorsum nose, glabella, L forehead, 74m f/u from LN2).  The following portions of the chart were reviewed this encounter and updated as appropriate:   Tobacco  Allergies  Meds  Problems  Med Hx  Surg Hx  Fam Hx      Review of Systems:  No other skin or systemic complaints except as noted in HPI or Assessment and Plan.  Objective  Well appearing patient in no apparent distress; mood and affect are within normal limits.  A focused examination was performed including face. Relevant physical exam findings are noted in the Assessment and Plan.  glabella Yellow lobulated paps  L mid dorsum nose x 1 Residual pink scaly macule   Assessment & Plan   Seborrheic Keratoses - Stuck-on, waxy, tan-brown papules and/or plaques  - Benign-appearing - Discussed benign etiology and prognosis. - Observe - Call for any changes  Sebaceous hyperplasia glabella Benign, observe.    AK (actinic keratosis) L mid dorsum nose x 1 Destruction of lesion - L mid dorsum nose x 1 Complexity: simple   Destruction method: cryotherapy   Informed consent: discussed and consent obtained   Timeout:  patient name, date of birth, surgical site, and procedure verified Lesion destroyed using liquid nitrogen: Yes   Region frozen until ice ball extended beyond lesion: Yes   Outcome: patient tolerated procedure well with no complications   Post-procedure details: wound care instructions given    Actinic Damage - chronic, secondary to cumulative UV radiation exposure/sun exposure over time - diffuse scaly erythematous macules with underlying dyspigmentation - Recommend daily broad spectrum sunscreen SPF 30+ to sun-exposed areas, reapply every 2 hours as needed.  - Recommend staying in the shade or wearing long sleeves, sun glasses (UVA+UVB protection) and wide brim hats (4-inch brim around  the entire circumference of the hat). - Call for new or changing lesions.  Return in about 1 year (around 11/02/2021) for TBSE, Hx of AKs.  I, Othelia Pulling, RMA, am acting as scribe for Sarina Ser, MD . Documentation: I have reviewed the above documentation for accuracy and completeness, and I agree with the above.  Sarina Ser, MD

## 2020-11-06 ENCOUNTER — Encounter: Payer: Self-pay | Admitting: Dermatology

## 2020-11-07 ENCOUNTER — Ambulatory Visit: Payer: Medicare Other | Admitting: Physical Therapy

## 2020-11-07 ENCOUNTER — Encounter: Payer: Self-pay | Admitting: Physical Therapy

## 2020-11-07 ENCOUNTER — Other Ambulatory Visit: Payer: Self-pay

## 2020-11-07 DIAGNOSIS — R42 Dizziness and giddiness: Secondary | ICD-10-CM | POA: Diagnosis not present

## 2020-11-07 NOTE — Therapy (Signed)
[place] MAIN [facility] SERVICES [address] [place], [place], [address] Phone: [phone]   Fax:  [phone]  Physical Therapy Treatment  Patient Details  Name: Lori Wagner MRN: [id number] Date of Birth: [birth year] Referring Provider (PT): Dr. Carmin Richmond   Encounter Date: 11/07/2020   PT End of Session - 11/07/20 1101     Visit Number 2    Number of Visits 9    Date for PT Re-Evaluation 12/27/20    PT Start Time 1101    PT Stop Time 1144    PT Time Calculation (min) 43 min    Equipment Utilized During Treatment Gait belt    Activity Tolerance Patient tolerated treatment well    Behavior During Therapy WFL for tasks assessed/performed             Past Medical History:  Diagnosis Date   Anxiety    Arthritis    knees,hip , back and hands   Chronic kidney disease    Hx of kidney stones, years ago   Coronary artery disease    Dysrhythmia    SVT- controlled with Beta blocker   Family history of bone cancer    Family history of lung cancer    Family history of ovarian cancer    Fatigue    HOH (hard of hearing)    tinnitis both ears   Hypertension     can cause lightheadedness   Motion sickness    Neuromuscular disorder (HCC)    carpal tunnel both hands   PONV (postoperative nausea and vomiting)    Post herpetic neuralgia    left side of face   RA (rheumatoid arthritis) ([place])    Shingles 02/2017   left side of face   Sleep apnea    Hx of/ when overweight, no CPAP   SVT (supraventricular tachycardia) (HCC)    Thyroid disease    HX OF/ NO LONGER TAKING THYROID MEDS, THYROID TESTS CAME BACK NORMAL   Vertigo    Hx of    Past Surgical History:  Procedure Laterality Date   ANKLE SURGERY Left    APPENDECTOMY     BLADDER REPAIR     CARDIAC CATHETERIZATION     CARDIAC SURGERY     CATARACT EXTRACTION W/PHACO Left 06/03/2017   Procedure: CATARACT EXTRACTION PHACO AND INTRAOCULAR LENS PLACEMENT ([place])  LEFT;  Surgeon:  Eulogio Bear, MD;  Location: [place];  Service: Ophthalmology;  Laterality: Left;   CATARACT EXTRACTION W/PHACO Right 07/08/2017   Procedure: CATARACT EXTRACTION PHACO AND INTRAOCULAR LENS PLACEMENT ([place]) RIGHT;  Surgeon: Eulogio Bear, MD;  Location: [place];  Service: Ophthalmology;  Laterality: Right;   COLONOSCOPY N/A 01/08/2017   Procedure: COLONOSCOPY;  Surgeon: [name], MD;  Location: [facility] ENDOSCOPY;  Service: Gastroenterology;  Laterality: N/A;   CORONARY ARTERY BYPASS GRAFT  2001   x4   GASTRIC BYPASS     SLEEVE   HAND SURGERY Right    2 TRIGGER FINGERS   KNEE ARTHROSCOPY Right    renal colic     TRIGGER FINGER RELEASE Right 01/17/2016   Procedure: RELEASE TRIGGER FINGER/A-1 PULLEY THUMB;  Surgeon: Corky Mull, MD;  Location: [place];  Service: Orthopedics;  Laterality: Right;    There were no vitals filed for this visit.   Subjective Assessment - 11/07/20 1103     Subjective Patient states that she has been trying to do the VOR X 1 exercise.  Pertinent History History obtained from MR and patient report. Patient reports in the fall of 2018, she had shingles in her left ear that affected her face, left mandible, half her tongue and left ear that went undiagnosed for awhile. Patient reports she had significant dizziness and she could not stand up without falling down. Patient states she was sick for 6 weeks and states she has had posttraumatic problems ever since. Patient reports that she has seen Dr. Pryor Ochoa, ENT physician, in regards to her symptoms. Patient states that Dr. Pryor Ochoa did some tests that revealed damage to the left ear and some central nervous symptoms as well. Patient reports that she is getting daily episodes of dizziness and reports that she has modified her movements and adopted strategies to try to avoid bringing on her symptoms of dizziness. Patient states she trained herself to look at the horizon as looking  down brings her symptoms and causes imbalance. Patient reports that looking down, quick turns and head turns provoke her symptoms and patient reports that staying vertical and focusing on a vertical target as well as touching surfaces as needed will help to decrease her symptoms. Patient reports that she veers to the right when she walks at times. Patient describes her dizziness as it feels like suddenly she is going in the wrong direction, vertigo, unsteadiness, aural fullness, and lightheadedness, which she states also happens if her blood pressure drops. Patient reports the dizziness lasts seconds. Patient's dizziness symptoms are motion provoked, positional, and intermittent. Patient reports her vestibular issues have not been as bad in the past few months. Patient reports that she likes to train her dog, but states that if she looks down to train the dog this will cause loss of balance. Patient's goal is to be able to look down at her dog without dizziness and imbalance. Patient reports that she no longer climbs ladders and that she does not ascend/descend steps without using a rail now due to her symptoms. 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    Diagnostic tests CT neck revealed Single enlarged left submandibular lymph node. No visible underlying  inflammation or mass. 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    Patient Stated Goals Patient reports she would like to be able to look down at her dog without dizziness and imbalance.            Neuromuscular Re-education:  VOR X 1 exercise:  Patient performed VOR X 1 horizontal in standing with conflicting background 1 rep of 1 minute.  Patient performed VOR X 1 vertical in standing with plain and then conflicting background 3 reps of 1 minute each.  Patient reports 2/10 dizziness  with vertical VOR X 1 activity.  Added conflicting background vertical VOR x 1 to HEP.   Ambulation with head turns:  Patient performed 45' trials of forwards and retro ambulation with horizontal, diagonal and vertical head turns with CGA.  Patient with evidence of mild imbalance with ambulation with head turns.   Body Wall Rolls:  Patient performed 5 reps of supported, body wall rolls with eyes open.  Patient reports 5-6/10 dizziness with this activity.  Added to HEP.   Airex pad:  On Airex pad, patient performed feet together progressions and semi-tandem progressions with alternating lead leg with and without horizontal, vertical  and diagonal head turns and body turns with CGA.  Patient demonstrates mild unsteadiness with activities on Airex pad.   Added diagonal head turns semitandem with alternating lead leg to HEP added conflicting background to VOR x1 in standing and body wall rolls to HEP.    Patient progressing with vestibular therapy and able to work on VOR X 1 with conflicting background and vertical progressions as well as Airex pad progressions this date. Patient challenged by body wall rolls, and activities with head turns on firm and uneven surfaces this date. Will plan on trying ball sorting on firm and red foam floor mat as well as figure 8s with cones next session as patient reports she has difficulty when training her dog with doing figure 8s as well as being able to look down and to the side while moving with the dog. Patient would benefit from continued PT services to further address goals and subjective symptoms of dizziness.       PT Education - 11/07/20 1101     Education Details discussed exercise technique; Added diagonal head turns semitandem with alternating lead leg to HEP added conflicting background to VOR x1 in standing and body wall rolls to HEP.     Person(s) Educated Patient    Methods Explanation, demonstrated, handout   Comprehension Verbalized  understanding              PT Short Term Goals - 11/03/20 0915       PT SHORT TERM GOAL #1   Title Pt will be independent with HEP in order to improve balance and decrease dizziness symptoms in order to improve function at home and in the community.    Time 4    Period Weeks    Status New    Target Date 11/30/20               PT Long Term Goals - 11/01/20 1350       PT LONG TERM GOAL #1   Title Patient will have improved FOTO score of 6 points or greater in order to demonstrate improvements in patient's ADLs and functional performance.    Baseline scored 48/100 on 11/01/20;    Time 8    Period Weeks    Status New    Target Date 12/27/20      PT LONG TERM GOAL #2   Title Patient will report 50% or greater improvement in her symptoms of dizziness and imbalance with provoking motions or positions.    Baseline pt reports dizziness with quick turns, heada turns and bending forward    Time 8    Period Weeks    Status New    Target Date 12/27/20      PT LONG TERM GOAL #3   Title Patient will be able to train and walk her dog without loss of balance when she looks down or bends over.    Baseline on 11/01/20, pt reports that she avoids looking down and bending over as this causes loss of balance;    Time 8    Period Weeks    Status New    Target Date 12/27/20               Plan - 11/10/20 1202     Clinical Impression Statement Patient progressing with vestibular therapy and able to work on VOR X 1 with conflicting background and vertical progressions as well as Airex pad progressions this date. Patient challenged by body wall rolls, and activities  with head turns on firm and uneven surfaces this date. Will plan on trying ball sorting on firm and red foam floor mat as well as figure 8s with cones next session as patient reports she has difficulty when training her dog with doing figure 8s as well as being able to look down and to the side while moving with the dog.  Patient would benefit from continued PT services to further address goals and subjective symptoms of dizziness.    Personal Factors and Comorbidities Comorbidity 3+    Comorbidities Shingles left face/ear 2018, sinusitis, CAD, hypotension, hypothyrodism, clinical depression, chronic fatigue and immune dysfunction syndrome    Examination-Activity Limitations [place]   looking down, quick turns and head turns   Examination-Participation Restrictions Other   training her dog   Stability/Clinical Decision Making Evolving/Moderate complexity    Rehab Potential Good    PT Frequency 1x / week    PT Duration 8 weeks    PT Treatment/Interventions Canalith Repostioning;Gait training;Stair training;Therapeutic exercise;Balance training;Neuromuscular re-education;Therapeutic activities;Vestibular;Patient/family education    PT Next Visit Plan review HEP, work on feet together and semitandem progressions with head turns on firm and foam, amb while scanning for targets    PT Home Exercise Plan Access Code: [email or web address]  URL: [email or web address]  Date: 11/01/2020  Prepared by: Lady Deutscher    Exercises  Seated VOR X1 exercise - 3 x daily - 7 x weekly - 1 sets - 3 reps - 1 minute hold    Consulted and Agree with Plan of Care Patient                   Patient will benefit from skilled therapeutic intervention in order to improve the following deficits and impairments:     Visit Diagnosis: Dizziness and giddiness     Problem List Patient Active Problem List   Diagnosis Date Noted   Chest pain 11/24/2019   Genetic testing 02/02/2019   Family history of ovarian cancer    Family history of bone cancer    Family history of lung cancer    CAD (coronary artery disease) 07/03/2017   Hypotension 07/03/2017   Proctitis 01/07/2017   Constipation    LLQ abdominal pain    Abnormal CT of the abdomen    Tremor 08/07/2016   SVT (supraventricular tachycardia) (HCC) 06/25/2016   Trigger  finger of right thumb 01/18/2016   Carpal tunnel syndrome 10/13/2015   Bilateral carpal tunnel syndrome 10/13/2015   Arthralgia of multiple joints 07/03/2015   CFIDS (chronic fatigue and immune dysfunction syndrome) ([place]) 07/03/2015   Degeneration of intervertebral disc of lumbar region 07/03/2015   Hand paresthesia 07/03/2015   Major depressive disorder, recurrent, severe without psychotic features ([place]) 12/21/2014   Panic disorder 12/21/2014   Impingement syndrome of shoulder 07/22/2014   Infraspinatus tenosynovitis 07/22/2014   Impingement syndrome of left shoulder 07/22/2014   Other synovitis and tenosynovitis, left shoulder 07/22/2014   Left supraspinatus tenosynovitis 07/22/2014   Acquired hypothyroidism 07/12/2014   Pain in shoulder 07/12/2014   Clinical depression 07/12/2014   Hyperlipidemia 07/12/2014   Lady Deutscher PT, DPT [phone] Lady Deutscher 11/07/2020, 11:47 AM  [place] MAIN [facility] SERVICES [address] [place], [place], [address] Phone: [phone]   Fax:  [phone]  Name: Lori Wagner MRN: [id number] Date of Birth: 1951/03/20

## 2020-11-28 ENCOUNTER — Encounter: Payer: Self-pay | Admitting: Physical Therapy

## 2020-11-28 ENCOUNTER — Ambulatory Visit: Payer: Medicare Other | Attending: Otolaryngology | Admitting: Physical Therapy

## 2020-11-28 ENCOUNTER — Other Ambulatory Visit: Payer: Self-pay

## 2020-11-28 DIAGNOSIS — R42 Dizziness and giddiness: Secondary | ICD-10-CM | POA: Diagnosis not present

## 2020-11-28 NOTE — Therapy (Signed)
[place] MAIN [facility] SERVICES [address] [place], [place], [address] Phone: [phone]   Fax:  [phone]  Physical Therapy Treatment  Patient Details  Name: Lori Wagner MRN: [id number] Date of Birth: 1951/04/22 Referring Provider (PT): Dr. Carmin Richmond   Encounter Date: 11/28/2020   PT End of Session - 11/28/20 1116     Visit Number 3    Number of Visits 9    Date for PT Re-Evaluation 12/27/20    PT Start Time 1115    PT Stop Time 1158    PT Time Calculation (min) 43 min    Equipment Utilized During Treatment Gait belt    Activity Tolerance Patient tolerated treatment well    Behavior During Therapy WFL for tasks assessed/performed             Past Medical History:  Diagnosis Date   Anxiety    Arthritis    knees,hip , back and hands   Chronic kidney disease    Hx of kidney stones, years ago   Coronary artery disease    Dysrhythmia    SVT- controlled with Beta blocker   Family history of bone cancer    Family history of lung cancer    Family history of ovarian cancer    Fatigue    HOH (hard of hearing)    tinnitis both ears   Hypertension     can cause lightheadedness   Motion sickness    Neuromuscular disorder (HCC)    carpal tunnel both hands   PONV (postoperative nausea and vomiting)    Post herpetic neuralgia    left side of face   RA (rheumatoid arthritis) ([place])    Shingles 02/2017   left side of face   Sleep apnea    Hx of/ when overweight, no CPAP   SVT (supraventricular tachycardia) (HCC)    Thyroid disease    HX OF/ NO LONGER TAKING THYROID MEDS, THYROID TESTS CAME BACK NORMAL   Vertigo    Hx of    Past Surgical History:  Procedure Laterality Date   ANKLE SURGERY Left    APPENDECTOMY     BLADDER REPAIR     CARDIAC CATHETERIZATION     CARDIAC SURGERY     CATARACT EXTRACTION W/PHACO Left 06/03/2017   Procedure: CATARACT EXTRACTION PHACO AND INTRAOCULAR LENS PLACEMENT ([place])  LEFT;  Surgeon:  Eulogio Bear, MD;  Location: [facility];  Service: Ophthalmology;  Laterality: Left;   CATARACT EXTRACTION W/PHACO Right 07/08/2017   Procedure: CATARACT EXTRACTION PHACO AND INTRAOCULAR LENS PLACEMENT ([place]) RIGHT;  Surgeon: Eulogio Bear, MD;  Location: [facility];  Service: Ophthalmology;  Laterality: Right;   COLONOSCOPY N/A 01/08/2017   Procedure: COLONOSCOPY;  Surgeon: [name], MD;  Location: [facility] ENDOSCOPY;  Service: Gastroenterology;  Laterality: N/A;   CORONARY ARTERY BYPASS GRAFT  2001   x4   GASTRIC BYPASS     SLEEVE   HAND SURGERY Right    2 TRIGGER FINGERS   KNEE ARTHROSCOPY Right    renal colic     TRIGGER FINGER RELEASE Right 01/17/2016   Procedure: RELEASE TRIGGER FINGER/A-1 PULLEY THUMB;  Surgeon: Corky Mull, MD;  Location: [facility];  Service: Orthopedics;  Laterality: Right;    There were no vitals filed for this visit.   Subjective Assessment - 11/28/20 1116     Subjective Patient states that she did fine this past week. Patient states that the body wall  is the best exercise. Patient states she had difficulty doing the pillow exercises because they were not challenging enough. Patient reports states she did not have much dizziness this past week.    Pertinent History History obtained from MR and patient report. Patient reports in the fall of 2018, she had shingles in her left ear that affected her face, left mandible, half her tongue and left ear that went undiagnosed for awhile. Patient reports she had significant dizziness and she could not stand up without falling down. Patient states she was sick for 6 weeks and states she has had posttraumatic problems ever since. Patient reports that she has seen Dr. Pryor Ochoa, ENT physician, in regards to her symptoms. Patient states that Dr. Pryor Ochoa did some tests that revealed damage to the left ear and some central nervous symptoms as well. Patient reports that she is getting daily  episodes of dizziness and reports that she has modified her movements and adopted strategies to try to avoid bringing on her symptoms of dizziness. Patient states she trained herself to look at the horizon as looking down brings her symptoms and causes imbalance. Patient reports that looking down, quick turns and head turns provoke her symptoms and patient reports that staying vertical and focusing on a vertical target as well as touching surfaces as needed will help to decrease her symptoms. Patient reports that she veers to the right when she walks at times. Patient describes her dizziness as it feels like suddenly she is going in the wrong direction, vertigo, unsteadiness, aural fullness, and lightheadedness, which she states also happens if her blood pressure drops. Patient reports the dizziness lasts seconds. Patient's dizziness symptoms are motion provoked, positional, and intermittent. Patient reports her vestibular issues have not been as bad in the past few months. Patient reports that she likes to train her dog, but states that if she looks down to train the dog this will cause loss of balance. Patient's goal is to be able to look down at her dog without dizziness and imbalance. Patient reports that she no longer climbs ladders and that she does not ascend/descend steps without using a rail now due to her symptoms. 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    Diagnostic tests CT neck revealed Single enlarged left submandibular lymph node. No visible underlying  inflammation or mass. 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    Patient Stated Goals Patient reports she would like to be able to look down at her dog without dizziness and imbalance.            Neuromuscular Re-education:  Diona Foley Sorting Activity: On red foam mat, performed  transferring multicolored balls from bin placed on floor to another bin placed 180 degrees on the other side of patient while patient visually tracks the ball so that patient is required to turn 180 degrees Left and Right to turn to bend over and pick and place balls in the bins with CGA. Pt with evidence of mild imbalance that patient was able to self-correct and patient reports increase in symptoms with this activity.   Figure eight cone activity: Figure eight walking with bending to touch cones placed in figure eight pattern on the floor.  Repeated activity with patient looking down and back towards the cones as she was walking in  a double figure eight pattern.  Patient performed multiple reps of above activities with CGA. Patient had no losses of balance.   Diona Foley toss over shoulder: Patient performed multiple 175' trials of forward and retro ambulation while tossing ball over one shoulder with return catch over opposite shoulder with CGA. Patient reports no dizziness with this activity but mild imbalance.  Discussed doing unsupported EO and supported EC with husband spotting body wall rolls, tandem stance with head turns and body turns on firm and on pillow semi-tandem for home. Will stop VOR X1 for now and try progressions next time to see if that reproduces symptoms.      PT Education - 11/28/20 1116     Education Details Reviewed home exercise program and discussed performing tandem stance with head turns at kitchen counter and added progression of unsupported body wall rolls with eyes open and with husband spotting patient supported body wall rolls with eyes closed.    Person(s) Educated Patient    Methods Explanation;Verbal cues    Comprehension Verbalized understanding;Returned demonstration              PT Short Term Goals - 11/03/20 0915       PT SHORT TERM GOAL #1   Title Pt will be independent with HEP in order to improve balance and decrease dizziness symptoms in order to  improve function at home and in the community.    Time 4    Period Weeks    Status New    Target Date 11/30/20               PT Long Term Goals - 11/01/20 1350       PT LONG TERM GOAL #1   Title Patient will have improved FOTO score of 6 points or greater in order to demonstrate improvements in patient's ADLs and functional performance.    Baseline scored 48/100 on 11/01/20;    Time 8    Period Weeks    Status New    Target Date 12/27/20      PT LONG TERM GOAL #2   Title Patient will report 50% or greater improvement in her symptoms of dizziness and imbalance with provoking motions or positions.    Baseline pt reports dizziness with quick turns, heada turns and bending forward    Time 8    Period Weeks    Status New    Target Date 12/27/20      PT LONG TERM GOAL #3   Title Patient will be able to train and walk her dog without loss of balance when she looks down or bends over.    Baseline on 11/01/20, pt reports that she avoids looking down and bending over as this causes loss of balance;    Time 8    Period Weeks    Status New    Target Date 12/27/20               Plan - 12/01/20 1230     Clinical Impression Statement Patient states that she did well this past week and had reduced dizziness. Patient reports that the body wall rolls exercise is the most challenging and discussed ways to progress for home exercise program. Patient reported mild reproduction of her dizziness symptoms with the ball sorting activity on the red foam mat. Will plan on trying VOR X1 with conflicting background while standing on foam next session. Patient would benefit from continued PT services to further address goals.  Personal Factors and Comorbidities Comorbidity 3+    Comorbidities Shingles left face/ear 2018, sinusitis, CAD, hypotension, hypothyrodism, clinical depression, chronic fatigue and immune dysfunction syndrome    Examination-Activity Limitations [place]   looking down, quick  turns and head turns   Examination-Participation Restrictions Other   training her dog   Stability/Clinical Decision Making Evolving/Moderate complexity    Rehab Potential Good    PT Frequency 1x / week    PT Duration 8 weeks    PT Treatment/Interventions [place] Repostioning;Gait training;Stair training;Therapeutic exercise;Balance training;Neuromuscular re-education;Therapeutic activities;Vestibular;Patient/family education    PT Next Visit Plan review HEP, work on feet together and semitandem progressions with head turns on firm and foam, amb while scanning for targets    PT Home Exercise Plan Access Code: [email or web address]  URL: [email or web address]  Date: 11/01/2020  Prepared by: Lady Deutscher    Exercises  Seated VOR X1 exercise - 3 x daily - 7 x weekly - 1 sets - 3 reps - 1 minute hold    Consulted and Agree with Plan of Care Patient                   Patient will benefit from skilled therapeutic intervention in order to improve the following deficits and impairments:     Visit Diagnosis: Dizziness and giddiness     Problem List Patient Active Problem List   Diagnosis Date Noted   Chest pain 11/24/2019   Genetic testing 02/02/2019   Family history of ovarian cancer    Family history of bone cancer    Family history of lung cancer    CAD (coronary artery disease) 07/03/2017   Hypotension 07/03/2017   Proctitis 01/07/2017   Constipation    LLQ abdominal pain    Abnormal CT of the abdomen    Tremor 08/07/2016   SVT (supraventricular tachycardia) (HCC) 06/25/2016   Trigger finger of right thumb 01/18/2016   Carpal tunnel syndrome 10/13/2015   Bilateral carpal tunnel syndrome 10/13/2015   Arthralgia of multiple joints 07/03/2015   CFIDS (chronic fatigue and immune dysfunction syndrome) ([place]) 07/03/2015   Degeneration of intervertebral disc of lumbar region 07/03/2015   Hand paresthesia 07/03/2015   Major depressive disorder, recurrent, severe without  psychotic features ([place]) 12/21/2014   Panic disorder 12/21/2014   Impingement syndrome of shoulder 07/22/2014   Infraspinatus tenosynovitis 07/22/2014   Impingement syndrome of left shoulder 07/22/2014   Other synovitis and tenosynovitis, left shoulder 07/22/2014   Left supraspinatus tenosynovitis 07/22/2014   Acquired hypothyroidism 07/12/2014   Pain in shoulder 07/12/2014   Clinical depression 07/12/2014   Hyperlipidemia 07/12/2014   Lady Deutscher PT, DPT [phone] Lady Deutscher 11/28/2020, 4:46 PM  [place] MAIN [facility] SERVICES [address] [place], [place], [address] Phone: [phone]   Fax:  [phone]  Name: Eufemia Leretha Wagner MRN: [id number] Date of Birth: 1951/01/01

## 2020-12-05 ENCOUNTER — Encounter: Payer: Self-pay | Admitting: Physical Therapy

## 2020-12-05 ENCOUNTER — Other Ambulatory Visit: Payer: Self-pay

## 2020-12-05 ENCOUNTER — Ambulatory Visit: Payer: Medicare Other | Admitting: Physical Therapy

## 2020-12-05 DIAGNOSIS — R42 Dizziness and giddiness: Secondary | ICD-10-CM

## 2020-12-05 NOTE — Therapy (Signed)
[place] MAIN [facility] SERVICES [address] [place], [place], [address] Phone: [phone]   Fax:  [phone]  Physical Therapy Treatment  Patient Details  Name: Lori Wagner MRN: [id number] Date of Birth: [birth year] Referring Provider (PT): Dr. Carmin Richmond   Encounter Date: 12/05/2020   PT End of Session - 12/05/20 1113     Visit Number 4    Number of Visits 9    Date for PT Re-Evaluation 12/27/20    PT Start Time 1114    PT Stop Time 1200    PT Time Calculation (min) 46 min    Equipment Utilized During Treatment Gait belt    Activity Tolerance Patient tolerated treatment well    Behavior During Therapy WFL for tasks assessed/performed             Past Medical History:  Diagnosis Date   Anxiety    Arthritis    knees,hip , back and hands   Chronic kidney disease    Hx of kidney stones, years ago   Coronary artery disease    Dysrhythmia    SVT- controlled with Beta blocker   Family history of bone cancer    Family history of lung cancer    Family history of ovarian cancer    Fatigue    HOH (hard of hearing)    tinnitis both ears   Hypertension     can cause lightheadedness   Motion sickness    Neuromuscular disorder (HCC)    carpal tunnel both hands   PONV (postoperative nausea and vomiting)    Post herpetic neuralgia    left side of face   RA (rheumatoid arthritis) ([place])    Shingles 02/2017   left side of face   Sleep apnea    Hx of/ when overweight, no CPAP   SVT (supraventricular tachycardia) (HCC)    Thyroid disease    HX OF/ NO LONGER TAKING THYROID MEDS, THYROID TESTS CAME BACK NORMAL   Vertigo    Hx of    Past Surgical History:  Procedure Laterality Date   ANKLE SURGERY Left    APPENDECTOMY     BLADDER REPAIR     CARDIAC CATHETERIZATION     CARDIAC SURGERY     CATARACT EXTRACTION W/PHACO Left 06/03/2017   Procedure: CATARACT EXTRACTION PHACO AND INTRAOCULAR LENS PLACEMENT ([place])  LEFT;  Surgeon:  Eulogio Bear, MD;  Location: [place];  Service: Ophthalmology;  Laterality: Left;   CATARACT EXTRACTION W/PHACO Right 07/08/2017   Procedure: CATARACT EXTRACTION PHACO AND INTRAOCULAR LENS PLACEMENT ([place]) RIGHT;  Surgeon: Eulogio Bear, MD;  Location: [place];  Service: Ophthalmology;  Laterality: Right;   COLONOSCOPY N/A 01/08/2017   Procedure: COLONOSCOPY;  Surgeon: [name], MD;  Location: [facility] ENDOSCOPY;  Service: Gastroenterology;  Laterality: N/A;   CORONARY ARTERY BYPASS GRAFT  2001   x4   GASTRIC BYPASS     SLEEVE   HAND SURGERY Right    2 TRIGGER FINGERS   KNEE ARTHROSCOPY Right    renal colic     TRIGGER FINGER RELEASE Right 01/17/2016   Procedure: RELEASE TRIGGER FINGER/A-1 PULLEY THUMB;  Surgeon: Corky Mull, MD;  Location: [place];  Service: Orthopedics;  Laterality: Right;    There were no vitals filed for this visit.   Subjective Assessment - 12/05/20 1117     Subjective Patient states that she is challenged by tandem stance balance activities and states this has  always been a challenging activity for her. Patient reports she was able to do eyes closed body wall rolls without dizziness at home now. Patient states she worked with her dog this past week and states she has not had too much trouble. Patient states she is showing the dog this  weekend so this will be a test to see how her dizziness symptoms are.    Pertinent History History obtained from MR and patient report. Patient reports in the fall of 2018, she had shingles in her left ear that affected her face, left mandible, half her tongue and left ear that went undiagnosed for awhile. Patient reports she had significant dizziness and she could not stand up without falling down. Patient states she was sick for 6 weeks and states she has had posttraumatic problems ever since. Patient reports that she has seen Dr. Pryor Ochoa, ENT physician, in regards to her symptoms. Patient  states that Dr. Pryor Ochoa did some tests that revealed damage to the left ear and some central nervous symptoms as well. Patient reports that she is getting daily episodes of dizziness and reports that she has modified her movements and adopted strategies to try to avoid bringing on her symptoms of dizziness. Patient states she trained herself to look at the horizon as looking down brings her symptoms and causes imbalance. Patient reports that looking down, quick turns and head turns provoke her symptoms and patient reports that staying vertical and focusing on a vertical target as well as touching surfaces as needed will help to decrease her symptoms. Patient reports that she veers to the right when she walks at times. Patient describes her dizziness as it feels like suddenly she is going in the wrong direction, vertigo, unsteadiness, aural fullness, and lightheadedness, which she states also happens if her blood pressure drops. Patient reports the dizziness lasts seconds. Patient's dizziness symptoms are motion provoked, positional, and intermittent. Patient reports her vestibular issues have not been as bad in the past few months. Patient reports that she likes to train her dog, but states that if she looks down to train the dog this will cause loss of balance. Patient's goal is to be able to look down at her dog without dizziness and imbalance. Patient reports that she no longer climbs ladders and that she does not ascend/descend steps without using a rail now due to her symptoms. 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    Diagnostic tests CT neck revealed Single enlarged left submandibular lymph node. No visible underlying  inflammation or mass. 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    Patient Stated Goals Patient reports she would like to be  able to look down at her dog without dizziness and imbalance.            Neuromuscular Re-education:  Diona Foley toss:  Patient performed forward ambulation while second person tossed ball at varying heights and directions-to left, right and center while the patient tracked the ball with her head and eyes. Patient reports no dizziness and had  2 very mild imbalances that patient was able to self-correct with CGA.   VOR X 1 exercise:  Patient performed VOR X 1 horizontal in standing on Airex pad with conflicting background 2 reps of 1 minute each with good technique.  Patient denies dizziness with  this activity.   Quick Turns:  Patient performed multiple reps of walking with random quick turns to the left and right with CGA.  Patient reports no dizziness with this activity and had no losses of balance this date.   Ambulation with head turns:  Patient performed multiple reps of 125' forwards and retro ambulation with horizontal and vertical head turns with CGA  Patient reports no dizziness with this activity. Patient with a few mild imbalances with patient able to self-correct.   Rockerboard: On medium wooden rocker board, worked on side to side and anterior/posterior sways with and without horizontal and vertical head turns with CGA.  Patient had a few losses of balance with reaching for ballet bar for support. Patient reported mild dizziness.   Slow Marching: Patient performed on firm surface, slow marching with CGA at ballet bar. Patient reaching to touch for support multiple times during these activities due to loss of balance.   Note: Patient largely denied dizziness during session and was challenging only by a few high level balance activities this date.  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      PT Education - 12/05/20 1113     Education Details Discussed plan of  care; discussed exercise progressions    Person(s) Educated Patient    Methods Explanation;Verbal cues    Comprehension Verbalized understanding              PT Short Term Goals - 11/03/20 0915       PT SHORT TERM GOAL #1   Title Pt will be independent with HEP in order to improve balance and decrease dizziness symptoms in order to improve function at home and in the community.    Time 4    Period Weeks    Status New    Target Date 11/30/20               PT Long Term Goals - 11/01/20 1350       PT LONG TERM GOAL #1   Title Patient will have improved FOTO score of 6 points or greater in order to demonstrate improvements in patient's ADLs and functional performance.    Baseline scored 48/100 on 11/01/20;    Time 8    Period Weeks    Status New    Target Date 12/27/20      PT LONG TERM GOAL #2   Title Patient will report 50% or greater improvement in her symptoms of dizziness and imbalance with provoking motions or positions.    Baseline pt reports dizziness with quick turns, heada turns and bending forward    Time 8    Period Weeks    Status New    Target Date 12/27/20      PT LONG TERM GOAL #3   Title Patient will be able to train and walk her dog without loss of balance when she looks down or bends over.    Baseline on 11/01/20, pt reports that she avoids looking down and bending over as this causes loss of balance;    Time 8    Period Weeks    Status New    Target Date 12/27/20                   Plan - 12/05/20 1939     Clinical Impression Statement Patient returns to the  clinic reporting that she had minimal symptoms again this week. Patient is going to try to show her dog in a competition this weekend and patient to report next session if she was able to do the show without any dizziness or imbalance. Patient reporting that now she is able to perform body wall rolls with eyes closed at home without dizziness. Patient's balance was challenged by  rockerboard, slow marching and ambulation with head turning activities. Will plan on adding slow marching to home exercise program next session and will plan on repeating functional outcome measures next session if patient continues to report good progress with symptoms reduction. Patient would benefit from continued PT services to work on progressions of balance and vestibular exercise to address goals.    Personal Factors and Comorbidities Comorbidity 3+    Comorbidities Shingles left face/ear 2018, sinusitis, CAD, hypotension, hypothyrodism, clinical depression, chronic fatigue and immune dysfunction syndrome    Examination-Activity Limitations [place]   looking down, quick turns and head turns   Examination-Participation Restrictions Other   training her dog   Stability/Clinical Decision Making Evolving/Moderate complexity    Rehab Potential Good    PT Frequency 1x / week    PT Duration 8 weeks    PT Treatment/Interventions Canalith Repostioning;Gait training;Stair training;Therapeutic exercise;Balance training;Neuromuscular re-education;Therapeutic activities;Vestibular;Patient/family education    PT Next Visit Plan review HEP, work on feet together and semitandem progressions with head turns on firm and foam, amb while scanning for targets    PT Home Exercise Plan Access Code: [email or web address]  URL: [email or web address]  Date: 11/01/2020  Prepared by: Lady Deutscher    Exercises  Seated VOR X1 exercise - 3 x daily - 7 x weekly - 1 sets - 3 reps - 1 minute hold    Consulted and Agree with Plan of Care Patient             Patient will benefit from skilled therapeutic intervention in order to improve the following deficits and impairments:  Decreased balance, Dizziness  Visit Diagnosis: Dizziness and giddiness     Problem List Patient Active Problem List   Diagnosis Date Noted   Chest pain 11/24/2019   Genetic testing 02/02/2019   Family history of ovarian cancer    Family  history of bone cancer    Family history of lung cancer    CAD (coronary artery disease) 07/03/2017   Hypotension 07/03/2017   Proctitis 01/07/2017   Constipation    LLQ abdominal pain    Abnormal CT of the abdomen    Tremor 08/07/2016   SVT (supraventricular tachycardia) (HCC) 06/25/2016   Trigger finger of right thumb 01/18/2016   Carpal tunnel syndrome 10/13/2015   Bilateral carpal tunnel syndrome 10/13/2015   Arthralgia of multiple joints 07/03/2015   CFIDS (chronic fatigue and immune dysfunction syndrome) ([place]) 07/03/2015   Degeneration of intervertebral disc of lumbar region 07/03/2015   Hand paresthesia 07/03/2015   Major depressive disorder, recurrent, severe without psychotic features ([place]) 12/21/2014   Panic disorder 12/21/2014   Impingement syndrome of shoulder 07/22/2014   Infraspinatus tenosynovitis 07/22/2014   Impingement syndrome of left shoulder 07/22/2014   Other synovitis and tenosynovitis, left shoulder 07/22/2014   Left supraspinatus tenosynovitis 07/22/2014   Acquired hypothyroidism 07/12/2014   Pain in shoulder 07/12/2014   Clinical depression 07/12/2014   Hyperlipidemia 07/12/2014    Lady Deutscher 12/05/2020, 7:43 PM  [place] MAIN [facility] SERVICES [place], [place], [address] Phone: [phone]   Fax:  [phone]  Name: Lori Wagner MRN: [id number] Date of Birth: April 11, 1951

## 2020-12-12 ENCOUNTER — Ambulatory Visit: Payer: Medicare Other | Admitting: Physical Therapy

## 2020-12-12 ENCOUNTER — Other Ambulatory Visit: Payer: Self-pay

## 2020-12-12 DIAGNOSIS — R42 Dizziness and giddiness: Secondary | ICD-10-CM | POA: Diagnosis not present

## 2020-12-12 NOTE — Therapy (Signed)
[place] MAIN [facility] SERVICES [address] [place], [place], [address] Phone: [phone]   Fax:  [phone]  Physical Therapy Treatment/discharge summary Dates of service: 11/01/2020 - 12/12/2020 Total number of visits: 5  Patient Details  Name: Lori Wagner MRN: [id number] Date of Birth: 12-Jul-1950 Referring Provider (PT): Dr. Carmin Richmond   Encounter Date: 12/12/2020   PT End of Session - 12/12/20 1114     Visit Number 5    Number of Visits 9    Date for PT Re-Evaluation 12/27/20    PT Start Time 1114    PT Stop Time 1154    PT Time Calculation (min) 40 min    Equipment Utilized During Treatment Gait belt    Activity Tolerance Patient tolerated treatment well    Behavior During Therapy WFL for tasks assessed/performed             Past Medical History:  Diagnosis Date   Anxiety    Arthritis    knees,hip , back and hands   Chronic kidney disease    Hx of kidney stones, years ago   Coronary artery disease    Dysrhythmia    SVT- controlled with Beta blocker   Family history of bone cancer    Family history of lung cancer    Family history of ovarian cancer    Fatigue    HOH (hard of hearing)    tinnitis both ears   Hypertension     can cause lightheadedness   Motion sickness    Neuromuscular disorder (HCC)    carpal tunnel both hands   PONV (postoperative nausea and vomiting)    Post herpetic neuralgia    left side of face   RA (rheumatoid arthritis) ([place])    Shingles 02/2017   left side of face   Sleep apnea    Hx of/ when overweight, no CPAP   SVT (supraventricular tachycardia) (HCC)    Thyroid disease    HX OF/ NO LONGER TAKING THYROID MEDS, THYROID TESTS CAME BACK NORMAL   Vertigo    Hx of    Past Surgical History:  Procedure Laterality Date   ANKLE SURGERY Left    APPENDECTOMY     BLADDER REPAIR     CARDIAC CATHETERIZATION     CARDIAC SURGERY     CATARACT EXTRACTION W/PHACO Left 06/03/2017    Procedure: CATARACT EXTRACTION PHACO AND INTRAOCULAR LENS PLACEMENT ([place])  LEFT;  Surgeon: Eulogio Bear, MD;  Location: [place];  Service: Ophthalmology;  Laterality: Left;   CATARACT EXTRACTION W/PHACO Right 07/08/2017   Procedure: CATARACT EXTRACTION PHACO AND INTRAOCULAR LENS PLACEMENT ([place]) RIGHT;  Surgeon: Eulogio Bear, MD;  Location: [place];  Service: Ophthalmology;  Laterality: Right;   COLONOSCOPY N/A 01/08/2017   Procedure: COLONOSCOPY;  Surgeon: [name], MD;  Location: [facility] ENDOSCOPY;  Service: Gastroenterology;  Laterality: N/A;   CORONARY ARTERY BYPASS GRAFT  2001   x4   GASTRIC BYPASS     SLEEVE   HAND SURGERY Right    2 TRIGGER FINGERS   KNEE ARTHROSCOPY Right    renal colic     TRIGGER FINGER RELEASE Right 01/17/2016   Procedure: RELEASE TRIGGER FINGER/A-1 PULLEY THUMB;  Surgeon: Corky Mull, MD;  Location: [place];  Service: Orthopedics;  Laterality: Right;    There were no vitals filed for this visit.   Subjective Assessment - 12/14/20 1527     Subjective Patient states that  she was able to do the dog competition this past Friday and Saturday. Patient states she did not have any balance or dizziness issues during the competition.   Furthermore, patient reports she did not have any balance or dizziness issues this past week. Patient reports that she has been performing HEP without issues and she has no questions or concerns in regards to HEP.    Pertinent History History obtained from MR and patient report. Patient reports in the fall of 2018, she had shingles in her left ear that affected her face, left mandible, half her tongue and left ear that went undiagnosed for awhile. Patient reports she had significant dizziness and she could not stand up without falling down. Patient states she was sick for 6 weeks and states she has had posttraumatic problems ever since. Patient reports that she has seen Dr. Pryor Ochoa, ENT  physician, in regards to her symptoms. Patient states that Dr. Pryor Ochoa did some tests that revealed damage to the left ear and some central nervous symptoms as well. Patient reports that she is getting daily episodes of dizziness and reports that she has modified her movements and adopted strategies to try to avoid bringing on her symptoms of dizziness. Patient states she trained herself to look at the horizon as looking down brings her symptoms and causes imbalance. Patient reports that looking down, quick turns and head turns provoke her symptoms and patient reports that staying vertical and focusing on a vertical target as well as touching surfaces as needed will help to decrease her symptoms. Patient reports that she veers to the right when she walks at times. Patient describes her dizziness as it feels like suddenly she is going in the wrong direction, vertigo, unsteadiness, aural fullness, and lightheadedness, which she states also happens if her blood pressure drops. Patient reports the dizziness lasts seconds. Patient's dizziness symptoms are motion provoked, positional, and intermittent. Patient reports her vestibular issues have not been as bad in the past few months. Patient reports that she likes to train her dog, but states that if she looks down to train the dog this will cause loss of balance. Patient's goal is to be able to look down at her dog without dizziness and imbalance. Patient reports that she no longer climbs ladders and that she does not ascend/descend steps without using a rail now due to her symptoms. 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    Diagnostic tests CT neck revealed Single enlarged left submandibular lymph node. No visible underlying  inflammation or mass. 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    Patient  Stated Goals Patient reports she would like to be able to look down at her dog without dizziness and imbalance.            Neuromuscular Re-education:  Patient states that she was able to do the dog competition this past Friday and Saturday. Patient states she did not have any balance or dizziness issues during the competition.  Furthermore, patient reports she did not have any balance or dizziness issues this past week.   Clinical Test of Sensory Interaction for Balance (CTSIB): CONDITION TIME STRATEGY SWAY  Eyes open, firm surface 30 seconds ankle   Eyes closed, firm surface 30 seconds ankle +1  Eyes open, foam surface 30 seconds ankle +1  Eyes closed, foam surface 30 seconds Ankle +2    FUNCTIONAL OUTCOME MEASURES:  Results Comments  [place] 4/100 Low perception of handicap; normal  ABC Scale 96.8% Decrease falls risk; normal  DGI 23/24 Safe for community mobility  FOTO 70/100 Exceeded projected amount of change; demonstrated 22 points of improvement  Dizziness functional status 62.5 Scale (27-67)   Discussed functional outcome test results and compared to prior testing. Discussed progress towards goals and discharge plans. Patient in agreement with discharge from PT services and will plan on continuing to perform HEP upon discharge. Discussed and reviewed home exercise program and provided handout of slow marching, walking with head turns and walking with quick turns for home exercise program.    PT Education - 12/12/20 1114     Education Details discussed functional outcome testing and compared test results to prior results; discussed discharge plans   Person(s) Educated Patient    Methods Explanation    Comprehension Verbalized understanding              PT Short Term Goals - 12/14/20 1525       PT SHORT TERM GOAL #1   Title Pt will be independent with HEP in order to improve balance and decrease dizziness symptoms in order to improve function at home and in the  community.    Baseline on 12/12/20: pt reports I with HEP and has no questions or concerns about HEP    Time 4    Period Weeks    Status Achieved    Target Date 11/30/20               PT Long Term Goals - 12/12/20 1148       PT LONG TERM GOAL #1   Title Patient will have improved FOTO score of 6 points or greater in order to demonstrate improvements in patient's ADLs and functional performance.    Baseline scored 48/100 on 11/01/20;    Time 8    Period Weeks    Status Achieved      PT LONG TERM GOAL #2   Title Patient will report 50% or greater improvement in her symptoms of dizziness and imbalance with provoking motions or positions.    Baseline pt reports dizziness with quick turns, head turns and bending forward; reports 100% improvement reports no issues with these movements at the dog show    Time 8    Period Weeks    Status Achieved      PT LONG TERM GOAL #3   Title Patient will be able to train and walk her dog without loss of balance when she looks down or bends over.    Baseline on 11/01/20, pt reports that she avoids looking down and bending over as this causes loss of balance;    Time 8    Period Weeks    Status Achieved              Plan - 12/14/20 1534     Clinical Impression Statement Patient reports that she had no further episodes of dizziness or balance difficulties this past week and that she was able to participate in the dog competition last Friday and Saturday without dizziness. Repeated functional outcome testing. Patient scored 4/100 on the Dizziness Handicap Inventory indicating low perception of handicap. Patient scored 96.8% on the ABC scale and 23/24 on the DGI indicating decreased falls risk and safe for community mobility. Patient scored 70/100 on FOTO outcome test. Patient has met 1/1 STG  and 4/4 LTGs as set on plan of care. Patient is independent with HEP and plans on continuing to perform upon discharge. Patient in agreement with discharge from  PT services at this time. Will discharge patient with all goals met.    Personal Factors and Comorbidities Comorbidity 3+    Comorbidities Shingles left face/ear 2018, sinusitis, CAD, hypotension, hypothyrodism, clinical depression, chronic fatigue and immune dysfunction syndrome    Examination-Activity Limitations [place]   looking down, quick turns and head turns   Examination-Participation Restrictions Other   training her dog   Stability/Clinical Decision Making Evolving/Moderate complexity    Rehab Potential Good    PT Frequency 1x / week    PT Duration 8 weeks    PT Treatment/Interventions [place] Repostioning;Gait training;Stair training;Therapeutic exercise;Balance training;Neuromuscular re-education;Therapeutic activities;Vestibular;Patient/family education    PT Next Visit Plan review HEP, work on feet together and semitandem progressions with head turns on firm and foam, amb while scanning for targets    PT Home Exercise Plan Access Code: [email or web address]  URL: [email or web address]  Date: 11/01/2020  Prepared by: Lady Deutscher    Exercises  Seated VOR X1 exercise - 3 x daily - 7 x weekly - 1 sets - 3 reps - 1 minute hold    Consulted and Agree with Plan of Care Patient              Patient will benefit from skilled therapeutic intervention in order to improve the following deficits and impairments:     Visit Diagnosis: Dizziness and giddiness     Problem List Patient Active Problem List   Diagnosis Date Noted   Chest pain 11/24/2019   Genetic testing 02/02/2019   Family history of ovarian cancer    Family history of bone cancer    Family history of lung cancer    CAD (coronary artery disease) 07/03/2017   Hypotension 07/03/2017   Proctitis 01/07/2017   Constipation    LLQ abdominal pain    Abnormal CT of the abdomen    Tremor 08/07/2016   SVT (supraventricular tachycardia) (HCC) 06/25/2016   Trigger finger of right thumb 01/18/2016   Carpal tunnel  syndrome 10/13/2015   Bilateral carpal tunnel syndrome 10/13/2015   Arthralgia of multiple joints 07/03/2015   CFIDS (chronic fatigue and immune dysfunction syndrome) ([place]) 07/03/2015   Degeneration of intervertebral disc of lumbar region 07/03/2015   Hand paresthesia 07/03/2015   Major depressive disorder, recurrent, severe without psychotic features ([place]) 12/21/2014   Panic disorder 12/21/2014   Impingement syndrome of shoulder 07/22/2014   Infraspinatus tenosynovitis 07/22/2014   Impingement syndrome of left shoulder 07/22/2014   Other synovitis and tenosynovitis, left shoulder 07/22/2014   Left supraspinatus tenosynovitis 07/22/2014   Acquired hypothyroidism 07/12/2014   Pain in shoulder 07/12/2014   Clinical depression 07/12/2014   Hyperlipidemia 07/12/2014   Lady Deutscher PT, DPT [id number] Lady Deutscher 12/12/2020, 4:41 PM  [place] MAIN [facility] SERVICES [address] [place], [place], [address] Phone: [phone]   Fax:  [phone]  Name: Lori Wagner MRN: [id number] Date of Birth: 19-Jun-1950

## 2020-12-14 ENCOUNTER — Encounter: Payer: Self-pay | Admitting: Physical Therapy

## 2020-12-19 ENCOUNTER — Ambulatory Visit: Payer: Medicare Other | Admitting: Physical Therapy

## 2020-12-26 ENCOUNTER — Ambulatory Visit: Payer: Medicare Other | Admitting: Physical Therapy

## 2021-01-09 ENCOUNTER — Encounter: Payer: Medicare Other | Admitting: Physical Therapy

## 2021-06-08 ENCOUNTER — Other Ambulatory Visit: Payer: Self-pay | Admitting: Family Medicine

## 2021-06-08 DIAGNOSIS — Z1231 Encounter for screening mammogram for malignant neoplasm of breast: Secondary | ICD-10-CM

## 2021-06-28 ENCOUNTER — Encounter: Payer: Self-pay | Admitting: Urology

## 2021-06-28 ENCOUNTER — Ambulatory Visit: Payer: Medicare Other | Admitting: Urology

## 2021-06-28 ENCOUNTER — Other Ambulatory Visit: Payer: Self-pay

## 2021-06-28 VITALS — BP 114/76 | HR 66 | Ht 65.0 in

## 2021-06-28 DIAGNOSIS — N200 Calculus of kidney: Secondary | ICD-10-CM

## 2021-06-28 DIAGNOSIS — R3129 Other microscopic hematuria: Secondary | ICD-10-CM | POA: Diagnosis not present

## 2021-06-28 DIAGNOSIS — R300 Dysuria: Secondary | ICD-10-CM

## 2021-06-28 LAB — MICROSCOPIC EXAMINATION
Bacteria, UA: NONE SEEN
Epithelial Cells (non renal): NONE SEEN /hpf (ref 0–10)

## 2021-06-28 LAB — URINALYSIS, COMPLETE
Bilirubin, UA: NEGATIVE
Glucose, UA: NEGATIVE
Ketones, UA: NEGATIVE
Leukocytes,UA: NEGATIVE
Nitrite, UA: NEGATIVE
Protein,UA: NEGATIVE
Specific Gravity, UA: >1.03 — ABNORMAL HIGH (ref 1.005–1.030)
Urobilinogen, Ur: 0.2 mg/dL (ref 0.2–1.0)
pH, UA: 5.5 (ref 5.0–7.5)

## 2021-06-28 NOTE — Progress Notes (Signed)
06/28/21 1:13 PM   Lori Wagner April 21, 1951 [id number]  CC: Dysuria, microscopic hematuria, left-sided flank pain  HPI: 71 year old female who was seen by PCP a few weeks ago for UTI symptoms of dysuria and urgency/frequency, and she also had some left-sided flank pain at that time.  UA on 06/01/2021 showed 4-10 WBCs, >50 RBCs, few bacteria, rare squamous cells and culture grew 50-100k mixed flora.  She was treated with Cipro for possible UTI.  She is still having some persistent dysuria and urgency/frequency.  She had a normal urinalysis in December 2022.  She has a history of kidney stones, and required ureteroscopy about 10 years ago.  She denies any gross hematuria.  Urinalysis today is completely benign.    PMH: Past Medical History:  Diagnosis Date   Anxiety    Arthritis    knees,hip , back and hands   Chronic kidney disease    Hx of kidney stones, years ago   Coronary artery disease    Dysrhythmia    SVT- controlled with Beta blocker   Family history of bone cancer    Family history of lung cancer    Family history of ovarian cancer    Fatigue    HOH (hard of hearing)    tinnitis both ears   Hypertension     can cause lightheadedness   Motion sickness    Neuromuscular disorder (HCC)    carpal tunnel both hands   PONV (postoperative nausea and vomiting)    Post herpetic neuralgia    left side of face   RA (rheumatoid arthritis) ([place])    Shingles 02/2017   left side of face   Sleep apnea    Hx of/ when overweight, no CPAP   SVT (supraventricular tachycardia) (HCC)    Thyroid disease    HX OF/ NO LONGER TAKING THYROID MEDS, THYROID TESTS CAME BACK NORMAL   Vertigo    Hx of    Surgical History: Past Surgical History:  Procedure Laterality Date   ANKLE SURGERY Left    APPENDECTOMY     BLADDER REPAIR     CARDIAC CATHETERIZATION     CARDIAC SURGERY     CATARACT EXTRACTION W/PHACO Left 06/03/2017   Procedure: CATARACT EXTRACTION PHACO AND INTRAOCULAR  LENS PLACEMENT ([place])  LEFT;  Surgeon: Eulogio Bear, MD;  Location: [place];  Service: Ophthalmology;  Laterality: Left;   CATARACT EXTRACTION W/PHACO Right 07/08/2017   Procedure: CATARACT EXTRACTION PHACO AND INTRAOCULAR LENS PLACEMENT ([place]) RIGHT;  Surgeon: Eulogio Bear, MD;  Location: [place];  Service: Ophthalmology;  Laterality: Right;   COLONOSCOPY N/A 01/08/2017   Procedure: COLONOSCOPY;  Surgeon: [name], MD;  Location: [facility] ENDOSCOPY;  Service: Gastroenterology;  Laterality: N/A;   CORONARY ARTERY BYPASS GRAFT  2001   x4   GASTRIC BYPASS     SLEEVE   HAND SURGERY Right    2 TRIGGER FINGERS   KNEE ARTHROSCOPY Right    renal colic     TRIGGER FINGER RELEASE Right 01/17/2016   Procedure: RELEASE TRIGGER FINGER/A-1 PULLEY THUMB;  Surgeon: Corky Mull, MD;  Location: [place];  Service: Orthopedics;  Laterality: Right;   Family History: Family History  Problem Relation Age of Onset   Hypertension Mother    Coronary artery disease Mother    Arthritis Mother    Osteoporosis Mother    Depression Mother    Anxiety disorder Mother    Coronary artery disease Father  Stroke Father    Coronary artery disease Brother    Bipolar disorder Brother    Drug abuse Brother    Coronary artery disease Maternal Aunt    Osteoporosis Maternal Aunt    Coronary artery disease Cousin    Osteoporosis Cousin    Anxiety disorder Daughter    Depression Daughter    Bone cancer Paternal Uncle    Lung cancer Paternal Grandmother    Ovarian cancer Cousin 28       CHEK2+   Breast cancer Neg Hx     Social History:  reports that she has never smoked. She has never used smokeless tobacco. She reports that she does not drink alcohol and does not use drugs.  Physical Exam: BP 114/76 (BP Location: Left Arm, Patient Position: Sitting, Cuff Size: Normal)    Pulse 66    Ht _0  (1.651 m)    BMI 23.63 kg/m    Constitutional:  Alert and oriented,  No acute distress. Cardiovascular: No clubbing, cyanosis, or edema. Respiratory: Normal respiratory effort, no increased work of breathing. GI: Abdomen is soft, nontender, nondistended, no abdominal masses  Assessment & Plan:   72 year old female with urinalysis with greater than 50 RBCs, but negative urine culture who presents with some persistent dysuria and urgency/frequency of unclear etiology.  Urinalysis today is completely benign.  I recommended a CT urogram with her history of microscopic hematuria with negative culture, as well as history of kidney stones.  If CT is normal, likely will need to pursue cystoscopy to rule out any bladder abnormalities.  If CT and cystoscopy normal, consider trial of topical estrogen cream for genitourinary syndrome of menopause   Nickolas Madrid, MD 06/28/2021  [place] [address] [place], [place] [address] [phone]

## 2021-06-28 NOTE — Patient Instructions (Signed)
With the microscopic blood in the urine we will order a CT scan to evaluate for kidney stones or other etiology of the blood or burning.  If the CT is completely normal likely will need to proceed with cystoscopy for bladder evaluation.  If both of these tests are benign, this may be related to genitourinary syndrome of menopause, which can be treated with topical estrogen cream with good results.  Cystoscopy Cystoscopy is a procedure that is used to help diagnose and sometimes treat conditions that affect the lower urinary tract. The lower urinary tract includes the bladder and the urethra. The urethra is the tube that drains urine from the bladder. Cystoscopy is done using a thin, tube-shaped instrument with a light and camera at the end (cystoscope). The cystoscope may be hard or flexible, depending on the goal of the procedure. The cystoscope is inserted through the urethra, into the bladder. Cystoscopy may be recommended if you have: Urinary tract infections that keep coming back. Blood in the urine (hematuria). An inability to control when you urinate (urinary incontinence) or an overactive bladder. Unusual cells found in a urine sample. A blockage in the urethra, such as a urinary stone. Painful urination. An abnormality in the bladder found during an intravenous pyelogram (IVP) or CT scan. What are the risks? Generally, this is a safe procedure. However, problems may occur, including: Infection. Bleeding.  What happens during the procedure?  You will be given one or more of the following: A medicine to numb the area (local anesthetic). The area around the opening of your urethra will be cleaned. The cystoscope will be passed through your urethra into your bladder. Germ-free (sterile) fluid will flow through the cystoscope to fill your bladder. The fluid will stretch your bladder so that your health care provider can clearly examine your bladder walls. Your doctor will look at the  urethra and bladder. The cystoscope will be removed The procedure may vary among health care providers  What can I expect after the procedure? After the procedure, it is common to have: Some soreness or pain in your urethra. Urinary symptoms. These include: Mild pain or burning when you urinate. Pain should stop within a few minutes after you urinate. This may last for up to a few days after the procedure. A small amount of blood in your urine for several days. Feeling like you need to urinate but producing only a small amount of urine. Follow these instructions at home: General instructions Return to your normal activities as told by your health care provider.  Drink plenty of fluids after the procedure. Keep all follow-up visits as told by your health care provider. This is important. Contact a health care provider if you: Have pain that gets worse or does not get better with medicine, especially pain when you urinate lasting longer than 72 hours after the procedure. Have trouble urinating. Get help right away if you: Have blood clots in your urine. Have a fever or chills. Are unable to urinate. Summary Cystoscopy is a procedure that is used to help diagnose and sometimes treat conditions that affect the lower urinary tract. Cystoscopy is done using a thin, tube-shaped instrument with a light and camera at the end. After the procedure, it is common to have some soreness or pain in your urethra. It is normal to have blood in your urine after the procedure.  If you were prescribed an antibiotic medicine, take it as told by your health care provider.  This information is  not intended to replace advice given to you by your health care provider. Make sure you discuss any questions you have with your health care provider. Document Revised: 05/05/2018 Document Reviewed: 05/05/2018 Elsevier Patient Education  [place].

## 2021-07-13 ENCOUNTER — Ambulatory Visit
Admission: RE | Admit: 2021-07-13 | Discharge: 2021-07-13 | Disposition: A | Discharge status: Home / Self Care | Payer: Medicare Other | Source: Ambulatory Visit | Attending: Urology | Admitting: Urology

## 2021-07-13 ENCOUNTER — Ambulatory Visit
Admission: RE | Admit: 2021-07-13 | Discharge: 2021-07-13 | Disposition: A | Discharge status: Home / Self Care | Payer: Medicare Other | Source: Ambulatory Visit | Attending: Family Medicine | Admitting: Family Medicine

## 2021-07-13 ENCOUNTER — Other Ambulatory Visit: Payer: Self-pay

## 2021-07-13 DIAGNOSIS — Z1231 Encounter for screening mammogram for malignant neoplasm of breast: Secondary | ICD-10-CM | POA: Insufficient documentation

## 2021-07-13 DIAGNOSIS — R3129 Other microscopic hematuria: Secondary | ICD-10-CM | POA: Diagnosis present

## 2021-07-13 LAB — POCT I-STAT CREATININE: Creatinine, Ser: 1 mg/dL (ref 0.44–1.00)

## 2021-07-13 MED ORDER — IOHEXOL 300 MG/ML  SOLN
100.0000 mL | Freq: Once | INTRAMUSCULAR | Status: AC | PRN
  Administered 2021-07-13: 100 mL via INTRAVENOUS

## 2021-07-24 ENCOUNTER — Telehealth: Payer: Self-pay

## 2021-07-24 NOTE — Telephone Encounter (Signed)
Called pt no answer. Left detailed message per DPR. Advised pt to call back to schedule cysto.

## 2021-07-24 NOTE — Telephone Encounter (Signed)
-----   Message from Billey Co, MD sent at 07/18/2021 12:59 PM EST ----- Good news, CT normal, would recommend cystoscopy as discussed in clinic, please schedule and review instructions  Nickolas Madrid, MD 07/18/2021

## 2021-07-25 ENCOUNTER — Other Ambulatory Visit: Payer: Self-pay

## 2021-07-25 ENCOUNTER — Emergency Department: Payer: Medicare Other

## 2021-07-25 ENCOUNTER — Emergency Department
Admission: EM | Admit: 2021-07-25 | Discharge: 2021-07-25 | Disposition: A | Discharge status: Home / Self Care | Payer: Medicare Other | Attending: Emergency Medicine | Admitting: Emergency Medicine

## 2021-07-25 DIAGNOSIS — R001 Bradycardia, unspecified: Secondary | ICD-10-CM | POA: Diagnosis not present

## 2021-07-25 DIAGNOSIS — R1030 Lower abdominal pain, unspecified: Secondary | ICD-10-CM | POA: Diagnosis present

## 2021-07-25 DIAGNOSIS — K5903 Drug induced constipation: Secondary | ICD-10-CM | POA: Diagnosis not present

## 2021-07-25 DIAGNOSIS — T402X5A Adverse effect of other opioids, initial encounter: Secondary | ICD-10-CM | POA: Diagnosis not present

## 2021-07-25 LAB — COMPREHENSIVE METABOLIC PANEL
ALT: 26 U/L (ref 0–44)
AST: 34 U/L (ref 15–41)
Albumin: 3.9 g/dL (ref 3.5–5.0)
Alkaline Phosphatase: 75 U/L (ref 38–126)
Anion gap: 12 (ref 5–15)
BUN: 23 mg/dL (ref 8–23)
CO2: 22 mmol/L (ref 22–32)
Calcium: 8.9 mg/dL (ref 8.9–10.3)
Chloride: 106 mmol/L (ref 98–111)
Creatinine, Ser: 0.89 mg/dL (ref 0.44–1.00)
GFR, Estimated: >60 mL/min (ref >60)
Glucose, Bld: 105 mg/dL — ABNORMAL HIGH (ref 70–99)
Potassium: 3.7 mmol/L (ref 3.5–5.1)
Sodium: 140 mmol/L (ref 135–145)
Total Bilirubin: 0.9 mg/dL (ref 0.3–1.2)
Total Protein: 7 g/dL (ref 6.5–8.1)

## 2021-07-25 LAB — CBC
HCT: 43 % (ref 36.0–46.0)
Hemoglobin: 14.5 g/dL (ref 12.0–15.0)
MCH: 31.7 pg (ref 26.0–34.0)
MCHC: 33.7 g/dL (ref 30.0–36.0)
MCV: 94.1 fL (ref 80.0–100.0)
Platelets: 275 10*3/uL (ref 150–400)
RBC: 4.57 MIL/uL (ref 3.87–5.11)
RDW: 11.7 % (ref 11.5–15.5)
WBC: 12.5 10*3/uL — ABNORMAL HIGH (ref 4.0–10.5)
nRBC: 0 % (ref 0.0–0.2)

## 2021-07-25 LAB — URINALYSIS, ROUTINE W REFLEX MICROSCOPIC
Bilirubin Urine: NEGATIVE
Glucose, UA: NEGATIVE mg/dL
Ketones, ur: 5 mg/dL — AB
Leukocytes,Ua: NEGATIVE
Nitrite: NEGATIVE
Protein, ur: NEGATIVE mg/dL
Specific Gravity, Urine: 1.028 (ref 1.005–1.030)
pH: 5 (ref 5.0–8.0)

## 2021-07-25 LAB — LIPASE, BLOOD: Lipase: 37 U/L (ref 11–51)

## 2021-07-25 MED ORDER — POLYETHYLENE GLYCOL 3350 17 G PO PACK
17.0000 g | PACK | Freq: Every day | ORAL | 0 refills | Status: DC
Start: 2021-07-25 — End: 2021-08-16

## 2021-07-25 MED ORDER — DOCUSATE SODIUM 50 MG/5ML PO LIQD
50.0000 mg | Freq: Once | ORAL | Status: AC
  Administered 2021-07-25: 50 mg
  Filled 2021-07-25 (×2): qty 10

## 2021-07-25 MED ORDER — MINERAL OIL RE ENEM
1.0000 | ENEMA | Freq: Once | RECTAL | Status: AC
Start: 2021-07-25 — End: 2021-07-25
  Administered 2021-07-25: 1 via RECTAL

## 2021-07-25 MED ORDER — IOHEXOL 300 MG/ML  SOLN
100.0000 mL | Freq: Once | INTRAMUSCULAR | Status: AC | PRN
  Administered 2021-07-25: 100 mL via INTRAVENOUS
  Filled 2021-07-25: qty 100

## 2021-07-25 MED ORDER — ONDANSETRON HCL 4 MG/2ML IJ SOLN
4.0000 mg | Freq: Once | INTRAMUSCULAR | Status: AC
  Administered 2021-07-25: 4 mg via INTRAVENOUS
  Filled 2021-07-25: qty 2

## 2021-07-25 MED ORDER — ONDANSETRON HCL 4 MG/2ML IJ SOLN
INTRAMUSCULAR | Status: AC
  Administered 2021-07-25: 4 mg via INTRAVENOUS
  Filled 2021-07-25: qty 2

## 2021-07-25 MED ORDER — FLEET ENEMA 7-19 GM/118ML RE ENEM
1.0000 | ENEMA | Freq: Once | RECTAL | Status: AC
  Administered 2021-07-25: 1 via RECTAL

## 2021-07-25 MED ORDER — ONDANSETRON HCL 4 MG/2ML IJ SOLN: 4.0000 mg | Freq: Once | INTRAMUSCULAR | Status: AC

## 2021-07-25 MED ORDER — SENNOSIDES-DOCUSATE SODIUM 8.6-50 MG PO TABS: 1.0000 | ORAL_TABLET | Freq: Every day | ORAL | 0 refills | Status: DC

## 2021-07-25 NOTE — ED Triage Notes (Signed)
Patient to ER via [facility] from home with complaints of constipation and abdominal pain she describes as non-radiating umbilical "cramping". Reports trying to have a bowel movement this morning but had a near-syncopal episode so called 911.  ? ?Reports she broke her left ring finger last week and has been taking tylenol with codeine, recently stopped. Reports hx of constipation previously after taking narcotics/ ? ?Nauseated- EMS administered 4mg  IV zofran and 200cc normal saline. ? ?VSS- 124/78, RR 24, 100% RA, HR 60. EKG shoes T wave inversion/ RBBB ? ?

## 2021-07-25 NOTE — ED Provider Notes (Signed)
[name] Provider Note  Patient Contact: 11:46 AM (approximate)   History   Abdominal Pain and Near Syncope   HPI  Lori Wagner is a 71 y.o. female who presents to the emergency department complaining of diffuse lower abdominal pain, constipation.  Patient states that a week ago she broke a finger, was placed on Tylenol with codeine.  Since then she has been constipated.  Patient states that she does have issues typically with narcotics and constipation but "forgot" and had not been taking any medications.  Patient states that she started MiraLAX and Dulcolax this morning.  She states that she started to have some abdominal cramping which has proceeded into frank pain.  She states that she tried to use the restroom, was straining so hard that she "about passed out."  Patient has no complaints at this time other than abdominal pain and constipation.  No changes in urination with dysuria, polyuria, hematuria.  She has had no emesis.     Physical Exam   Triage Vital Signs: ED Triage Vitals  Enc Vitals Group     BP 07/25/21 1117 116/70     Pulse Rate 07/25/21 1117 71     Resp 07/25/21 1117 20     Temp 07/25/21 1117 98.7 F (37.1 C)     Temp src --      SpO2 07/25/21 1117 100 %     Weight 07/25/21 1115 157 lb (71.2 kg)     Height 07/25/21 1115 5\' 5"  (1.651 m)     Head Circumference --      Peak Flow --      Pain Score 07/25/21 1115 6     Pain Loc --      Pain Edu? --      Excl. in [place]? --     Most recent vital signs: Vitals:   07/25/21 1532 07/25/21 1823  BP: (!) 115/56 129/67  Pulse: 62 61  Resp: 17 17  Temp: 98.4 F (36.9 C)   SpO2: 93% 95%     General: Alert and in no acute distress. Neck: No stridor. No cervical spine tenderness to palpation.  Cardiovascular:  Good peripheral perfusion.  No murmurs, rubs, gallops. Respiratory: Normal respiratory effort without tachypnea or retractions. Lungs CTAB. Good air entry to the bases with  no decreased or absent breath sounds. Gastrointestinal: Bowel sounds 4 quadrants with hyperactive bowel sounds in the right lower and bilateral upper quadrants.  Slightly decreased bowel sounds in the left lower quadrant..  Soft to palpation all quadrants but tender in the left and right lower quadrants..  Patient is guarding in the bilateral lower quadrants.  No rigidity.  No palpable masses. No distention. No CVA tenderness. Musculoskeletal: Full range of motion to all extremities.  Neurologic:  No gross focal neurologic deficits are appreciated.  Skin:   No rash noted Other:   ED Results / Procedures / Treatments   Labs (all labs ordered are listed, but only abnormal results are displayed) Labs Reviewed  COMPREHENSIVE METABOLIC PANEL - Abnormal; Notable for the following components:      Result Value   Glucose, Bld 105 (*)    All other components within normal limits  CBC - Abnormal; Notable for the following components:   WBC 12.5 (*)    All other components within normal limits  URINALYSIS, ROUTINE W REFLEX MICROSCOPIC - Abnormal; Notable for the following components:   Color, Urine YELLOW (*)    APPearance HAZY (*)  Hgb urine dipstick MODERATE (*)    Ketones, ur 5 (*)    Bacteria, UA RARE (*)    All other components within normal limits  LIPASE, BLOOD     EKG  ED ECG REPORT I, Charline Bills Wadsworth Skolnick,  personally viewed and interpreted this ECG.   Date: 07/25/2021  EKG Time: 1117 hrs.  Rate: 66 bpm  Rhythm: Patient has significant amount of artifact on EKG.  It appears that patient may have sinus arrhythmia.  Does appear to be a right bundle branch block.  Compared to previous EKG, significant artifact  Axis: Normal axis  Intervals:right bundle branch block  ST&T Change: No gross ST elevation or depression noted  Significant arc of artifact.  We will repeat EKG.   ED ECG REPORT I, Charline Bills Deddrick Saindon,  personally viewed and interpreted this ECG.   Date:  07/25/2021  EKG Time: 1207 hrs.  Rate: 56 bpm  Rhythm: unchanged from previous tracings, sinus bradycardia, RBBB, EKG compared to EKG earlier today as well as EKG from 11/26/2019  Axis: Normal axis  Intervals:right bundle branch block  ST&T Change: No ST elevation or depression noted  Sinus bradycardia, right bundle branch block.  Patient had EKG from earlier today with significant artifact complicating interpretation.  Repeated EKG which is consistent with her previous EKGs on file.  Patient has sinus bradycardia, right bundle branch block with no STEMI.     RADIOLOGY  I personally viewed and evaluated these images as part of my medical decision making, as well as reviewing the written report by the radiologist.  ED Provider Interpretation: Patient with constipation without other acute findings  CT ABDOMEN PELVIS W CONTRAST  Result Date: 07/25/2021 CLINICAL DATA:  Abdominal pain in BILATERAL lower quadrants, constipation, umbilical pain and cramping, near syncopal episode with attempted bowel movement this morning EXAM: CT ABDOMEN AND PELVIS WITH CONTRAST TECHNIQUE: Multidetector CT imaging of the abdomen and pelvis was performed using the standard protocol following bolus administration of intravenous contrast. RADIATION DOSE REDUCTION: This exam was performed according to the departmental dose-optimization program which includes automated exposure control, adjustment of the mA and/or kV according to patient size and/or use of iterative reconstruction technique. CONTRAST:  167mL OMNIPAQUE IOHEXOL 300 MG/ML SOLN IV. No oral contrast. COMPARISON:  07/13/2021 FINDINGS: Lower chest: Minimal dependent atelectasis at lung bases Hepatobiliary: Gallbladder and liver normal appearance Pancreas: Normal appearance Spleen: Normal appearance Adrenals/Urinary Tract: Adrenal glands, kidneys, and ureters normal appearance. Bladder decompressed, suboptimally assessed. No definite urinary tract calcification or  dilatation. Stomach/Bowel: Appendix surgically absent by history. Prior gastric stapling. Small bowel loops decompressed. Increased stool in descending colon through rectum with upper normal colonic wall thickness. More proximally, fluid distends portions of the colon though the transverse colon is fairly decompressed. No definite point of obstruction identified. Few uncomplicated sigmoid diverticula. No obstructing distal colonic lesion or point seen. Small bowel loops unremarkable. Vascular/Lymphatic: Atherosclerotic calcifications aorta without aneurysm. No adenopathy. Reproductive: Atrophic uterus and ovaries Other: No free air or free fluid. No hernia or inflammatory process. Musculoskeletal: Osseous demineralization. IMPRESSION: Increased stool in descending colon through rectum with upper normal colonic wall thickness, associated with mild fluid distension of the proximal colon, consistent with history of constipation. Minimal sigmoid diverticulosis without evidence of diverticulitis. No other acute intra-abdominal or intrapelvic abnormalities. Aortic Atherosclerosis (ICD10-I70.0). Electronically Signed   By: Lavonia Dana M.D.   On: 07/25/2021 14:11    PROCEDURES:  Critical Care performed: No  Procedures   MEDICATIONS ORDERED IN  ED: Medications  ondansetron (ZOFRAN) injection 4 mg (4 mg Intravenous Given 07/25/21 1340)  iohexol (OMNIPAQUE) 300 MG/ML solution 100 mL (100 mLs Intravenous Contrast Given 07/25/21 1350)  docusate (COLACE) 50 MG/5ML liquid 50 mg (50 mg Per Tube Given 07/25/21 1646)  sodium phosphate (FLEET) 7-19 GM/118ML enema 1 enema (1 enema Rectal Given 07/25/21 1645)  mineral oil enema 1 enema (1 enema Rectal Given 07/25/21 1645)     IMPRESSION / MDM / ASSESSMENT AND PLAN / ED COURSE  I reviewed the triage vital signs and the nursing notes.                              Differential diagnosis includes, but is not limited to, constipation, bowel obstruction,  diverticulitis   Patient's diagnosis is consistent with constipation.  Patient presents emergency department with drug-induced constipation secondary to narcotic.  Patient broke her finger, was placed on Tylenol with codeine.  She has difficulties with narcotics and constipation but forgot and had not started any medications prophylactically.  Patient has not had a bowel movement in a week.  She was tender across the lower abdomen.  Imaging revealed constipation without obstruction or other acute findings.  Patient is given enema here in the emergency department.  She will have prescriptions for Senokot and MiraLAX to ensure complete resolution of her constipation.  Follow-up primary care as needed.  Return precautions discussed with the patient. Patient is given ED precautions to return to the ED for any worsening or new symptoms.        FINAL CLINICAL IMPRESSION(S) / ED DIAGNOSES   Final diagnoses:  Drug-induced constipation     Rx / DC Orders   ED Discharge Orders          Ordered    senna-docusate (SENOKOT-S) 8.6-50 MG tablet  Daily        07/25/21 1859    polyethylene glycol (MIRALAX) 17 g packet  Daily        07/25/21 1859             Note:  This document was prepared using Dragon voice recognition software and may include unintentional dictation errors.   Brynda Peon 07/25/21 1902    Vanessa Carrollton, MD 07/26/21 [phone]

## 2021-08-16 ENCOUNTER — Ambulatory Visit: Payer: Medicare Other | Admitting: Urology

## 2021-08-16 ENCOUNTER — Other Ambulatory Visit: Payer: Self-pay

## 2021-08-16 ENCOUNTER — Encounter: Payer: Self-pay | Admitting: Urology

## 2021-08-16 VITALS — BP 103/61 | HR 71 | Ht 65.0 in | Wt 157.0 lb

## 2021-08-16 DIAGNOSIS — N958 Other specified menopausal and perimenopausal disorders: Secondary | ICD-10-CM | POA: Diagnosis not present

## 2021-08-16 MED ORDER — ESTRADIOL 0.1 MG/GM VA CREA: TOPICAL_CREAM | VAGINAL | 12 refills | Status: DC

## 2021-08-16 NOTE — Addendum Note (Signed)
Addended by: Donalee Citrin on: 08/16/2021 02:55 PM ? ? Modules accepted: Orders ? ?

## 2021-08-16 NOTE — Progress Notes (Signed)
Cystoscopy Procedure Note: ? ?Indication: Microscopic hematuria ? ?After informed consent and discussion of the procedure and its risks, Lori Wagner was positioned and prepped in the standard fashion. Cystoscopy was performed with a flexible cystoscope. The urethra, bladder neck and entire bladder was visualized in a standard fashion. The ureteral orifices were visualized in their normal location and orientation.  Bladder mucosa grossly normal throughout, no abnormalities on retroflexion. ? ?Imaging: ?CT hematuria benign ? ?Findings: ?Normal cystoscopy ? 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  RTC PA 9 months ? ?Nickolas Madrid, MD ?08/16/2021  ? ?

## 2021-10-18 ENCOUNTER — Ambulatory Visit (INDEPENDENT_AMBULATORY_CARE_PROVIDER_SITE_OTHER): Payer: Medicare Other | Admitting: Licensed Clinical Social Worker

## 2021-10-18 DIAGNOSIS — F4320 Adjustment disorder, unspecified: Secondary | ICD-10-CM | POA: Diagnosis not present

## 2021-10-18 NOTE — Progress Notes (Signed)
Comprehensive Clinical Assessment (CCA) Note  10/19/2021 Lori Wagner [id number]  [facility] in office visit for patient and LCSW clinician   Chief Complaint:  Chief Complaint  Patient presents with   Follow-up   Depression   Visit Diagnosis:  Encounter Diagnosis  Name Primary?   Adjustment disorder, unspecified type Yes     CCA Screening, Triage and Referral (STR)  Patient Reported Information How did you hear about [place]? Self  Referral name: No data recorded Referral phone number: No data recorded  Whom do you see for routine medical problems? No data recorded Practice/Facility Name: No data recorded Practice/Facility Phone Number: No data recorded Name of Contact: No data recorded Contact Number: No data recorded Contact Fax Number: No data recorded Prescriber Name: No data recorded Prescriber Address (if known): No data recorded  What Is the Reason for Your Visit/Call Today? [name] is a 71 year old female presenting to [place] for establishment about patient psychotherapy services. Patient reports that she currently takes effexor, buspar, Wellbutrin, and Trazodone. Patient reports that her primary care physician manages her medications. Patient denies any history of inpatient psychiatric hospitalizations. Patient denies current suicidal ideation, homicidal ideation, or perceptual disturbances. Patient reports that she has a complex medical history including heart disease, chronic fatigue, and arthritis. Patient reports that she had bariatric surgery in 2015 and has kept off all of her weight. Patient also reports that she has PTSD.  How Long Has This Been Causing You Problems? > than 6 months  What Do You Feel Would Help You the Most Today? Treatment for Depression or other mood problem   Have You Recently Been in Any Inpatient Treatment (Hospital/Detox/Crisis Center/28-Day Program)? No  Name/Location of Program/Hospital:No data recorded How Long Were You There? No data  recorded When Were You Discharged? No data recorded  Have You Ever Received Services From [facility] Before? Yes  Who [facility]? No data recorded  Have You Recently Had Any Thoughts About Hurting Yourself? No  Are You Planning to Commit Suicide/Harm Yourself At This time? No   Have you Recently Had Thoughts About [place]? No  Explanation: No data recorded  Have You Used Any Alcohol or Drugs in the Past 24 Hours? No  How Long Ago Did You Use Drugs or Alcohol? No data recorded What Did You Use and How Much? No data recorded  Do You Currently Have a Therapist/Psychiatrist? No  Name of Therapist/Psychiatrist: No data recorded  Have You Been Recently Discharged From Any Office Practice or Programs? No  Explanation of Discharge From Practice/Program: No data recorded    CCA Screening Triage Referral Assessment Type of Contact: Face-to-Face  Is this Initial or Reassessment? No data recorded Date Telepsych consult ordered in [facility]:  No data recorded Time Telepsych consult ordered in [facility]:  No data recorded  Patient Reported Information Reviewed? No data recorded Patient Left Without Being Seen? No data recorded Reason for Not Completing Assessment: No data recorded  Collateral Involvement: none   Does Patient Have a [place]? No data recorded Name and Contact of Legal Guardian: No data recorded If Minor and Not Living with Parent(s), Who has Custody? No data recorded Is CPS involved or ever been involved? Never  Is APS involved or ever been involved? Never   Patient Determined To Be At Risk for Harm To Self or Others Based on Review of Patient Reported Information or Presenting Complaint? No  Method: No data recorded Availability of Means:  No data recorded Intent: No data recorded Notification Required: No data recorded Additional Information for Danger to Others Potential: No data recorded Additional Comments for  Danger to Others Potential: No data recorded Are There Guns or Other Weapons in Your Home? No data recorded Types of Guns/Weapons: No data recorded Are These Weapons Safely Secured?                            No data recorded Who Could Verify You Are Able To Have These Secured: No data recorded Do You Have any Outstanding Charges, Pending Court Dates, Parole/Probation? No data recorded Contacted To Inform of Risk of Harm To Self or Others: Other: Comment (n/a)   Location of Assessment: Other (comment) ([facility])   Does Patient Present under Involuntary Commitment? No data recorded IVC Papers Initial File Date: No data recorded  [place] of Residence: [place]   Patient Currently Receiving the Following Services: Medication Management   Determination of Need: Routine (7 days)   Options For Referral: Medication Management; Outpatient Therapy     CCA Biopsychosocial Intake/Chief Complaint:  Stress, anxiety  Current Symptoms/Problems: No data recorded  Patient Reported Schizophrenia/Schizoaffective Diagnosis in Past: No   Strengths: teaching; education  Preferences: outpatient psychiatric supports  Abilities: enjoys making jewelry   Type of Services Patient Feels are Needed: medication management (PCP) and outpatient psychotherapy   Initial Clinical Notes/Concerns: No data recorded  Mental Health Symptoms Depression:  Difficulty Concentrating; Hopelessness   Duration of Depressive symptoms: Greater than two weeks   Mania:  None   Anxiety:   Worrying   Psychosis:  None   Duration of Psychotic symptoms: No data recorded  Trauma:  Hypervigilance   Obsessions:  None   Compulsions:  None   Inattention:  None   Hyperactivity/Impulsivity:  None   Oppositional/Defiant Behaviors:  None   Emotional Irregularity:  None   Other Mood/Personality Symptoms:  No data recorded   Mental Status Exam Appearance and self-care  Stature:  Tall   Weight:  Average  weight   Clothing:  Neat/clean   Grooming:  Well-groomed   Cosmetic use:  Age appropriate   Posture/gait:  Normal   Motor activity:  Not Remarkable   Sensorium  Attention:  Normal   Concentration:  Normal   Orientation:  X5   Recall/memory:  Normal   Affect and Mood  Affect:  Appropriate   Mood:  Euthymic   Relating  Eye contact:  Normal   Facial expression:  Responsive   Attitude toward examiner:  Cooperative   Thought and Language  Speech flow: Clear and Coherent   Thought content:  Appropriate to Mood and Circumstances   Preoccupation:  None   Hallucinations:  None   Organization:  No data recorded  [employer] of Knowledge:  Good   Intelligence:  Above Average   Abstraction:  Normal   Judgement:  Good   Reality Testing:  Realistic   Insight:  Good   Decision Making:  Normal   Social Functioning  Social Maturity:  Responsible   Social Judgement:  Normal   Stress  Stressors:  Work   Coping Ability:  Overwhelmed; Normal   Skill Deficits:  None   Supports:  Family; Friends/Service system     Religion: Religion/Spirituality Are You A Religious Person?: No  Leisure/Recreation: Leisure / Recreation Do You Have Hobbies?: Yes Leisure and Hobbies: Social worker; engaging in mentally stimulating activities;  Exercise/Diet: Exercise/Diet Do  You Exercise?: Yes What Type of Exercise Do You Do?: Run/Walk Have You Gained or Lost A Significant Amount of Weight in the Past Six Months?: Yes-Lost Do You Follow a Special Diet?: No Do You Have Any Trouble Sleeping?: Yes Explanation of Sleeping Difficulties: frequent waking   CCA Employment/Education Employment/Work Situation: Employment / Work Situation Employment Situation: Employed Where is Patient Currently Employed?: [employer] Are You Satisfied With Your Job?: No Has Patient ever Been in Passenger transport manager?: No  Education: Education Is Patient Currently Attending  School?: No Last Grade Completed: 12 Did Teacher, adult education From [employer]?: Yes Did Physicist, medical?: Yes What Type of College Degree Do you Have?: PHD Did You Attend Graduate School?: Yes What is Your Press photographer?: Education What Was Your Major?: Education Did You Have Any Special Interests In School?: Science Did You Have An Individualized Education Program (IIEP): No Did You Have Any Difficulty At School?: No Patient's Education Has Been Impacted by Current Illness: No   CCA Family/Childhood History Family and Relationship History: Family history Marital status: Married Does patient have children?: Yes How is patient's relationship with their children?: pt has a daughter  Childhood History:  Childhood History By whom was/is the patient raised?: Both parents Additional childhood history information: father finnish. Pt has older siblings--felt like she "lost" her brother when he moved away to college. pt remembers her parents arguing a lot Description of patient's relationship with caregiver when they were a child: stable Patient's description of current relationship with people who raised him/her: deceased Does patient have siblings?: Yes Number of Siblings: 2 Description of patient's current relationship with siblings: older brothers: 91 28 years older and one estranged Did patient suffer any verbal/emotional/physical/sexual abuse as a child?: No Did patient suffer from severe childhood neglect?: No Has patient ever been sexually abused/assaulted/raped as an adolescent or adult?: No Was the patient ever a victim of a crime or a disaster?: No Witnessed domestic violence?: No Has patient been affected by domestic violence as an adult?: No  Child/Adolescent Assessment:   N/a   CCA Substance Use Alcohol/Drug Use: Alcohol / Drug Use Pain Medications: SEE MAR Prescriptions: SEE MAR Over the Counter: SEE MAR History of alcohol / drug use?: No history of alcohol /  drug abuse Withdrawal Symptoms: None      ASAM's:  Six Dimensions of Multidimensional Assessment  Dimension 1:  Acute Intoxication and/or Withdrawal Potential:   Dimension 1:  Description of individual's past and current experiences of substance use and withdrawal: NONE  Dimension 2:  Biomedical Conditions and Complications:      Dimension 3:  Emotional, Behavioral, or Cognitive Conditions and Complications:     Dimension 4:  Readiness to Change:     Dimension 5:  Relapse, Continued use, or Continued Problem Potential:     Dimension 6:  Recovery/Living Environment:     ASAM Severity Score: ASAM's Severity Rating Score: 0  ASAM Recommended Level of Treatment: ASAM Recommended Level of Treatment: Level I Outpatient Treatment   Substance use Disorder (SUD) Substance Use Disorder (SUD)  Checklist Symptoms of Substance Use:  (NONE)  Recommendations for Services/Supports/Treatments: Recommendations for Services/Supports/Treatments Recommendations For Services/Supports/Treatments: Individual Therapy, Medication Management  DSM5 Diagnoses: Patient Active Problem List   Diagnosis Date Noted   Chest pain 11/24/2019   Genetic testing 02/02/2019   Family history of ovarian cancer    Family history of bone cancer    Family history of lung cancer    CAD (coronary artery disease)  07/03/2017   Hypotension 07/03/2017   Proctitis 01/07/2017   Constipation    LLQ abdominal pain    Abnormal CT of the abdomen    Tremor 08/07/2016   SVT (supraventricular tachycardia) ([place]) 06/25/2016   Trigger finger of right thumb 01/18/2016   Carpal tunnel syndrome 10/13/2015   Bilateral carpal tunnel syndrome 10/13/2015   Arthralgia of multiple joints 07/03/2015   CFIDS (chronic fatigue and immune dysfunction syndrome) ([place]) 07/03/2015   Degeneration of intervertebral disc of lumbar region 07/03/2015   Hand paresthesia 07/03/2015   Major depressive disorder, recurrent, severe without psychotic features  ([place]) 12/21/2014   Panic disorder 12/21/2014   Impingement syndrome of shoulder 07/22/2014   Infraspinatus tenosynovitis 07/22/2014   Impingement syndrome of left shoulder 07/22/2014   Other synovitis and tenosynovitis, left shoulder 07/22/2014   Left supraspinatus tenosynovitis 07/22/2014   Acquired hypothyroidism 07/12/2014   Pain in shoulder 07/12/2014   Clinical depression 07/12/2014   Hyperlipidemia 07/12/2014    Patient Centered Plan: Patient is on the following Treatment Plan(s):  Anxiety   Referrals to Alternative Service(s): Referred to Alternative Service(s):   Place:   Date:   Time:    Referred to Alternative Service(s):   Place:   Date:   Time:    Referred to Alternative Service(s):   Place:   Date:   Time:    Referred to Alternative Service(s):   Place:   Date:   Time:      Collaboration of Care: Other n/a at time of session  Patient/Guardian was advised Release of Information must be obtained prior to any record release in order to collaborate their care with an outside provider. Patient/Guardian was advised if they have not already done so to contact the registration department to sign all necessary forms in order for [place] to release information regarding their care.   Consent: Patient/Guardian gives verbal consent for treatment and assignment of benefits for services provided during this visit. Patient/Guardian expressed understanding and agreed to proceed.   Jaloni Sorber R Tena Linebaugh, LCSW

## 2021-10-19 NOTE — Plan of Care (Signed)
Developed tx plan based on pt self reported input  

## 2021-11-08 ENCOUNTER — Encounter: Payer: Self-pay | Admitting: Dermatology

## 2021-11-08 ENCOUNTER — Ambulatory Visit: Payer: Medicare Other | Admitting: Dermatology

## 2021-11-08 DIAGNOSIS — L814 Other melanin hyperpigmentation: Secondary | ICD-10-CM | POA: Diagnosis not present

## 2021-11-08 DIAGNOSIS — D229 Melanocytic nevi, unspecified: Secondary | ICD-10-CM

## 2021-11-08 DIAGNOSIS — L821 Other seborrheic keratosis: Secondary | ICD-10-CM

## 2021-11-08 DIAGNOSIS — Z1283 Encounter for screening for malignant neoplasm of skin: Secondary | ICD-10-CM | POA: Diagnosis not present

## 2021-11-08 DIAGNOSIS — Z872 Personal history of diseases of the skin and subcutaneous tissue: Secondary | ICD-10-CM

## 2021-11-08 DIAGNOSIS — D1800 Hemangioma unspecified site: Secondary | ICD-10-CM

## 2021-11-08 DIAGNOSIS — L578 Other skin changes due to chronic exposure to nonionizing radiation: Secondary | ICD-10-CM

## 2021-11-08 DIAGNOSIS — L738 Other specified follicular disorders: Secondary | ICD-10-CM

## 2021-11-08 NOTE — Patient Instructions (Signed)
Due to recent changes in healthcare laws, you may see results of your pathology and/or laboratory studies on MyChart before the doctors have had a chance to review them. We understand that in some cases there may be results that are confusing or concerning to you. Please understand that not all results are received at the same time and often the doctors may need to interpret multiple results in order to provide you with the best plan of care or course of treatment. Therefore, we ask that you please give us 2 business days to thoroughly review all your results before contacting the office for clarification. Should we see a critical lab result, you will be contacted sooner.   If You Need Anything After Your Visit  If you have any questions or concerns for your doctor, please call our main line at [phone] and press option 4 to reach your doctor's medical assistant. If no one answers, please leave a voicemail as directed and we will return your call as soon as possible. Messages left after 4 pm will be answered the following business day.   You may also send us a message via MyChart. We typically respond to MyChart messages within 1-2 business days.  For prescription refills, please ask your pharmacy to contact our office. Our fax number is [phone].  If you have an urgent issue when the clinic is closed that cannot wait until the next business day, you can page your doctor at the number below.    Please note that while we do our best to be available for urgent issues outside of office hours, we are not available 24/7.   If you have an urgent issue and are unable to reach us, you may choose to seek medical care at your doctor's office, retail clinic, urgent care center, or emergency room.  If you have a medical emergency, please immediately call 911 or go to the emergency department.  Pager Numbers  - Dr. Kowalski: [phone]  - Dr. Moye: [phone]  - Dr. Stewart:  [phone]  In the event of inclement weather, please call our main line at [phone] for an update on the status of any delays or closures.  Dermatology Medication Tips: Please keep the boxes that topical medications come in in order to help keep track of the instructions about where and how to use these. Pharmacies typically print the medication instructions only on the boxes and not directly on the medication tubes.   If your medication is too expensive, please contact our office at [phone] option 4 or send us a message through MyChart.   We are unable to tell what your co-pay for medications will be in advance as this is different depending on your insurance coverage. However, we may be able to find a substitute medication at lower cost or fill out paperwork to get insurance to cover a needed medication.   If a prior authorization is required to get your medication covered by your insurance company, please allow us 1-2 business days to complete this process.  Drug prices often vary depending on where the prescription is filled and some pharmacies may offer cheaper prices.  The website www.goodrx.com contains coupons for medications through different pharmacies. The prices here do not account for what the cost may be with help from insurance (it may be cheaper with your insurance), but the website can give you the price if you did not use any insurance.  - You can print the associated coupon and take it with   your prescription to the pharmacy.  - You may also stop by our office during regular business hours and pick up a GoodRx coupon card.  - If you need your prescription sent electronically to a different pharmacy, notify our office through [facility] MyChart or by phone at [phone] option 4.     Si Usted Necesita Algo Despus de Su Visita  Tambin puede enviarnos un mensaje a travs de MyChart. Por lo general respondemos a los mensajes de MyChart en el transcurso de 1 a 2  das hbiles.  Para renovar recetas, por favor pida a su farmacia que se ponga en contacto con nuestra oficina. Nuestro nmero de fax es el [phone].  Si tiene un asunto urgente cuando la clnica est cerrada y que no puede esperar hasta el siguiente da hbil, puede llamar/localizar a su doctor(a) al nmero que aparece a continuacin.   Por favor, tenga en cuenta que aunque hacemos todo lo posible para estar disponibles para asuntos urgentes fuera del horario de oficina, no estamos disponibles las 24 horas del da, los 7 das de la semana.   Si tiene un problema urgente y no puede comunicarse con nosotros, puede optar por buscar atencin mdica  en el consultorio de su doctor(a), en una clnica privada, en un centro de atencin urgente o en una sala de emergencias.  Si tiene una emergencia mdica, por favor llame inmediatamente al 911 o vaya a la sala de emergencias.  Nmeros de bper  - Dr. Kowalski: [phone]  - Dra. Moye: [phone]  - Dra. Stewart: [phone]  En caso de inclemencias del tiempo, por favor llame a nuestra lnea principal al [phone] para una actualizacin sobre el estado de cualquier retraso o cierre.  Consejos para la medicacin en dermatologa: Por favor, guarde las cajas en las que vienen los medicamentos de uso tpico para ayudarle a seguir las instrucciones sobre dnde y cmo usarlos. Las farmacias generalmente imprimen las instrucciones del medicamento slo en las cajas y no directamente en los tubos del medicamento.   Si su medicamento es muy caro, por favor, pngase en contacto con nuestra oficina llamando al [phone] y presione la opcin 4 o envenos un mensaje a travs de MyChart.   No podemos decirle cul ser su copago por los medicamentos por adelantado ya que esto es diferente dependiendo de la cobertura de su seguro. Sin embargo, es posible que podamos encontrar un medicamento sustituto a menor costo o llenar un formulario para que el  seguro cubra el medicamento que se considera necesario.   Si se requiere una autorizacin previa para que su compaa de seguros cubra su medicamento, por favor permtanos de 1 a 2 das hbiles para completar este proceso.  Los precios de los medicamentos varan con frecuencia dependiendo del lugar de dnde se surte la receta y alguna farmacias pueden ofrecer precios ms baratos.  El sitio web www.goodrx.com tiene cupones para medicamentos de diferentes farmacias. Los precios aqu no tienen en cuenta lo que podra costar con la ayuda del seguro (puede ser ms barato con su seguro), pero el sitio web puede darle el precio si no utiliz ningn seguro.  - Puede imprimir el cupn correspondiente y llevarlo con su receta a la farmacia.  - Tambin puede pasar por nuestra oficina durante el horario de atencin regular y recoger una tarjeta de cupones de GoodRx.  - Si necesita que su receta se enve electrnicamente a una farmacia diferente, informe a nuestra oficina a travs de MyChart de [place]   o por telfono llamando al [phone] y presione la opcin 4.  

## 2021-11-08 NOTE — Progress Notes (Unsigned)
Follow-Up Visit   Subjective  Lori Wagner is a 71 y.o. female who presents for the following: Total body skin exam (Hx of AKs). The patient presents for Total-Body Skin Exam (TBSE) for skin cancer screening and mole check.  The patient has spots, moles and lesions to be evaluated, some may be new or changing and the patient has concerns that these could be cancer.   The following portions of the chart were reviewed this encounter and updated as appropriate:   Tobacco  Allergies  Meds  Problems  Med Hx  Surg Hx  Fam Hx     Review of Systems:  No other skin or systemic complaints except as noted in HPI or Assessment and Plan.  Objective  Well appearing patient in no apparent distress; mood and affect are within normal limits.  A full examination was performed including scalp, head, eyes, ears, nose, lips, neck, chest, axillae, abdomen, back, buttocks, bilateral upper extremities, bilateral lower extremities, hands, feet, fingers, toes, fingernails, and toenails. All findings within normal limits unless otherwise noted below.  glabella Small yellow papules with a central dell   Assessment & Plan  Sebaceous hyperplasia glabella Benign, observe.   Discussed cosmetic procedure, noncovered.  $60 for 1st lesion and $15 for each additional lesion if done on the same day.  Maximum charge $350.  One touch-up treatment included no charge. Discussed risks of treatment including dyspigmentation, small scar, and/or recurrence. Recommend daily broad spectrum sunscreen SPF 30+/photoprotection to treated areas once healed.   Lentigines - Scattered tan macules - Due to sun exposure - Benign-appearing, observe - Recommend daily broad spectrum sunscreen SPF 30+ to sun-exposed areas, reapply every 2 hours as needed. - Call for any changes  Seborrheic Keratoses - Stuck-on, waxy, tan-brown papules and/or plaques  - Benign-appearing - Discussed benign etiology and prognosis. -  Observe - Call for any changes - face  Melanocytic Nevi - Tan-brown and/or pink-flesh-colored symmetric macules and papules - Benign appearing on exam today - Observation - Call clinic for new or changing moles - Recommend daily use of broad spectrum spf 30+ sunscreen to sun-exposed areas.  - trunk  Hemangiomas - Red papules - Discussed benign nature - Observe - Call for any changes - trunk  Actinic Damage - Chronic condition, secondary to cumulative UV/sun exposure - diffuse scaly erythematous macules with underlying dyspigmentation - Recommend daily broad spectrum sunscreen SPF 30+ to sun-exposed areas, reapply every 2 hours as needed.  - Staying in the shade or wearing long sleeves, sun glasses (UVA+UVB protection) and wide brim hats (4-inch brim around the entire circumference of the hat) are also recommended for sun protection.  - Call for new or changing lesions.  Skin cancer screening performed today.  History of PreCancerous Actinic Keratosis  - site(s) of PreCancerous Actinic Keratosis clear today. - these may recur and new lesions may form requiring treatment to prevent transformation into skin cancer - observe for new or changing spots and contact [facility] for appointment if occur - photoprotection with sun protective clothing; sunglasses and broad spectrum sunscreen with SPF of at least 30 + and frequent self skin exams recommended - yearly exams by a dermatologist recommended for persons with history of PreCancerous Actinic Keratoses   Return if symptoms worsen or fail to improve.  I, Othelia Pulling, RMA, am acting as scribe for Sarina Ser, MD . Documentation: I have reviewed the above documentation for accuracy and completeness, and I agree with the above.  Sarina Ser,  MD

## 2021-11-13 ENCOUNTER — Encounter: Payer: Self-pay | Admitting: Dermatology

## 2021-11-30 ENCOUNTER — Ambulatory Visit (INDEPENDENT_AMBULATORY_CARE_PROVIDER_SITE_OTHER): Payer: Medicare Other | Admitting: Licensed Clinical Social Worker

## 2021-11-30 DIAGNOSIS — F4320 Adjustment disorder, unspecified: Secondary | ICD-10-CM

## 2021-11-30 NOTE — Plan of Care (Signed)
  Problem: Anxiety Disorder  Goal:  Reduce overall frequency, intensity, and duration of the anxiety so that daily functioning is not impaired per pt self report 3 out of 5 sessions documented.   Outcome: Progressing Goal: Enhance ability to effectively cope with the full variety of life's worries and anxiety per self report 3 out of 5 sessions documented  Outcome: Progressing Goal: Develop healthy thinking patterns and beliefs about self, others, and the world that lead to the alleviation and help prevent the relapse of depression per self report 3 out of 5 sessions documented.   Outcome: Progressing Intervention: REVIEW PLEASE SKILLS (TREAT PHYSICAL ILLNESS, BALANCE EATING, AVOID MOOD-ALTERING SUBSTANCES, BALANCE SLEEP AND GET EXERCISE) WITH Lori Wagner Note: Reviewed--pt doing well and being intentional about activities Intervention: Assist with relaxation techniques, as appropriate (deep breathing exercises, meditation, guided imagery) Note: Reviewed Intervention: Encourage verbalization of feelings/concerns/expectations Note: Explored pt awareness of self, relationship expectations, and psychological impact of intolerance Intervention: Encourage self-care activities Note: Reviewed

## 2021-11-30 NOTE — Progress Notes (Addendum)
Virtual Visit via Audio Note  I connected with Lori Wagner on 11/30/21 at  9:00 AM EDT by an audio  enabled telemedicine application and verified that I am speaking with the correct person using two identifiers.  Video connection was lost when less than 50% of the duration of the visit was complete, at which time the remainder of the visit was completed via audio only.  Location: Patient: home Provider: remote office [place], [place])   I discussed the limitations of evaluation and management by telemedicine and the availability of in person appointments. The patient expressed understanding and agreed to proceed.  I discussed the assessment and treatment plan with the patient. The patient was provided an opportunity to ask questions and all were answered. The patient agreed with the plan and demonstrated an understanding of the instructions.   The patient was advised to call back or seek an in-person evaluation if the symptoms worsen or if the condition fails to improve as anticipated.  I provided 60 minutes of non-face-to-face time during this encounter.   [place], LCSW   THERAPIST PROGRESS NOTE  Session Time: 9-10a  Participation Level: Active  Behavioral Response: NAAlertAnxious  Type of Therapy: Individual Therapy  Treatment Goals addressed: Problem: Anxiety Disorder  Goal:  Reduce overall frequency, intensity, and duration of the anxiety so that daily functioning is not impaired per pt self report 3 out of 5 sessions documented.   Outcome: Progressing Goal: Enhance ability to effectively cope with the full variety of life's worries and anxiety per self report 3 out of 5 sessions documented  Outcome: Progressing Goal: Develop healthy thinking patterns and beliefs about self, others, and the world that lead to the alleviation and help prevent the relapse of depression per self report 3 out of 5 sessions documented.   Outcome: Progressing Intervention:  REVIEW PLEASE SKILLS (TREAT PHYSICAL ILLNESS, BALANCE EATING, AVOID MOOD-ALTERING SUBSTANCES, BALANCE SLEEP AND GET EXERCISE) WITH [name] Note: Reviewed--pt doing well and being intentional about activities Intervention: Assist with relaxation techniques, as appropriate (deep breathing exercises, meditation, guided imagery) Note: Reviewed Intervention: Encourage verbalization of feelings/concerns/expectations Note: Explored pt awareness of self, relationship expectations, and psychological impact of intolerance Intervention: Encourage self-care activities Note: Reviewed   ProgressTowards Goals: Progressing  Interventions: CBT and Supportive  Summary: Lori Wagner is a 71 y.o. female who presents with continuing symptoms related to adjustment disorder.Pt reports that she is compliant with using coping skills to manage current symptoms. Allowed pt to review what coping skills work best for her.  Allowed pt to explore and express thoughts and feelings associated with recent life situations and external stressors.Patient reports that she is continuing to feel distressed about recent estrangement from lifelong friend. Patient reports that she is sad that it happened, but that she feels strongly about the decision.   Patient explored the pros and cons of her friendship with this individual throughout the years. Patient reports that her friend recently has developed an intolerant world be including racism, judgmental comments about individuals looks, and feeling the need to control The music that others listen to, the TV shows that other people watch, and is overly critical of personal beliefs that are not in line with her own. Patient reports that she feels her friend lacks sensitivity, empathy, and compassion towards others. Patient  feels that her friend may be jealous of her.  Discussed importance of setting limits and boundaries with individuals if the relationship becomes toxic.  Discussed  self-awareness, and patients thoughts and  feelings about success, and whether patient feels that she is successful. Patient answers the question "no". Allowed patient to explore her answer, and encouraged her to continue expanding her self-awareness from this session to the next And we will further discuss patients definition of success, expectations of self, and any changes that patient can make that will make her feel more successful.  Continued recommendations are as follows: self care behaviors, positive social engagements, focusing on overall work/home/life balance, and focusing on positive physical and emotional wellness.  .   Suicidal/Homicidal: No  Therapist Response: Pt is continuing to apply interventions learned in session into daily life situations. Pt is currently on track to meet goals utilizing interventions mentioned above. Personal growth and progress noted. Treatment to continue as indicated.   Plan: Return again in 4 weeks.  Diagnosis:  Encounter Diagnosis  Name Primary?   Adjustment disorder, unspecified type Yes   Collaboration of Care: Other n/a at time of session  Patient/Guardian was advised Release of Information must be obtained prior to any record release in order to collaborate their care with an outside provider. Patient/Guardian was advised if they have not already done so to contact the registration department to sign all necessary forms in order for [place] to release information regarding their care.   Consent: Patient/Guardian gives verbal consent for treatment and assignment of benefits for services provided during this visit. Patient/Guardian expressed understanding and agreed to proceed.   [place], LCSW 11/30/2021

## 2022-01-07 ENCOUNTER — Ambulatory Visit (INDEPENDENT_AMBULATORY_CARE_PROVIDER_SITE_OTHER): Payer: Medicare Other | Admitting: Licensed Clinical Social Worker

## 2022-01-07 DIAGNOSIS — F4320 Adjustment disorder, unspecified: Secondary | ICD-10-CM | POA: Diagnosis not present

## 2022-01-07 NOTE — Progress Notes (Signed)
Virtual Visit via Audio Note  I connected with Lori Wagner on 01/07/22 at  9:00 AM EDT by an audio  enabled telemedicine application and verified that I am speaking with the correct person using two identifiers.  Video connection was lost when less than 50% of the duration of the visit was complete, at which time the remainder of the visit was completed via audio only.  Location: Patient: home Provider: remote office [place], [place])   I discussed the limitations of evaluation and management by telemedicine and the availability of in person appointments. The patient expressed understanding and agreed to proceed.  I discussed the assessment and treatment plan with the patient. The patient was provided an opportunity to ask questions and all were answered. The patient agreed with the plan and demonstrated an understanding of the instructions.   The patient was advised to call back or seek an in-person evaluation if the symptoms worsen or if the condition fails to improve as anticipated.  I provided 60 minutes of non-face-to-face time during this encounter.   [place], LCSW   THERAPIST PROGRESS NOTE  Session Time: 9-10a  Participation Level: Active  Behavioral Response: NAAlertAnxious  Type of Therapy: Individual Therapy  Treatment Goals addressed: Problem: Anxiety Disorder  Goal:  Reduce overall frequency, intensity, and duration of the anxiety so that daily functioning is not impaired per pt self report 3 out of 5 sessions documented.   Outcome: Progressing Goal: Enhance ability to effectively cope with the full variety of life's worries and anxiety per self report 3 out of 5 sessions documented  Outcome: Progressing Goal: Develop healthy thinking patterns and beliefs about self, others, and the world that lead to the alleviation and help prevent the relapse of depression per self report 3 out of 5 sessions documented.   Outcome: Progressing Intervention:  REVIEW PLEASE SKILLS (TREAT PHYSICAL ILLNESS, BALANCE EATING, AVOID MOOD-ALTERING SUBSTANCES, BALANCE SLEEP AND GET EXERCISE) WITH Icie Note: Reviewed--pt doing well and being intentional about activities Intervention: Assist with relaxation techniques, as appropriate (deep breathing exercises, meditation, guided imagery) Note: Reviewed Intervention: Encourage verbalization of feelings/concerns/expectations Note: Explored pt awareness of self, relationship expectations, and psychological impact of intolerance Intervention: Encourage self-care activities Note: Reviewed   ProgressTowards Goals: Progressing  Interventions: CBT and Supportive  Summary: Lori Wagner is a 71 y.o. female who presents with continuing symptoms related to adjustment disorder.Pt reports that she is compliant with using coping skills to manage current symptoms. Allowed pt to review what coping skills work best for her.   Middle brother bipolar--used the family for years financially. He would use mother. He would ask me for money and I said no  2-3 years ago--tried to reach out to him. Blamed me for estrangement w/ son. Inpatient facility--wanted to talk to me. Nervous. "I cant' reconnect with him"  Robin: tried to work with her. She walked away from me.  Friend in [place]. Invited herself with me and robin. She contacted me and we reconnected.   Had another friend in [place]. I don't want to be alone--don't want to be used.  [employer] [employer] is a user. Sometimes I put out all the effort.   Shirlean Mylar was best friend and other friend really nasty about her. Beverlee Nims.  Make me mad if friends don't write letters back.  Mariya died. Two othe friends die. One died and I didn't know  Lost a lot of people.cousin in [place]--wife never told pt. She went out of her way to keep him away from  family  Aunt died of a broken heart. Forced her to change her will. Mortimer Fries wants to change the will.went to funeral (aunt). Left  everything--took all valuable. Took stuff and its in my house. Took rings--she started giving me things. Mortimer Fries and glenna were weird about things.   Couldn't stand her. Bobby schizophrenic. First wife killed herself. He married 3 months later.   Lost friends and family one after another  Classes starting up: woodworking classes.  Tylenol  Reading books. Always read non fiction. Don't want to read politics (trump)  Continued recommendations are as follows: self care behaviors, positive social engagements, focusing on overall work/home/life balance, and focusing on positive physical and emotional wellness.  .   Suicidal/Homicidal: No  Therapist Response: Pt is continuing to apply interventions learned in session into daily life situations. Pt is currently on track to meet goals utilizing interventions mentioned above. Personal growth and progress noted. Treatment to continue as indicated.   Plan: Return again in 4 weeks.  Diagnosis:  Encounter Diagnosis  Name Primary?   Adjustment disorder, unspecified type Yes   Collaboration of Care: Other n/a at time of session  Patient/Guardian was advised Release of Information must be obtained prior to any record release in order to collaborate their care with an outside provider. Patient/Guardian was advised if they have not already done so to contact the registration department to sign all necessary forms in order for [place] to release information regarding their care.   Consent: Patient/Guardian gives verbal consent for treatment and assignment of benefits for services provided during this visit. Patient/Guardian expressed understanding and agreed to proceed.   [place], LCSW 01/07/2022

## 2022-01-08 NOTE — Plan of Care (Signed)
  Problem: Anxiety Disorder  Goal:  Reduce overall frequency, intensity, and duration of the anxiety so that daily functioning is not impaired per pt self report 3 out of 5 sessions documented.   Outcome: Progressing Goal: Enhance ability to effectively cope with the full variety of life's worries and anxiety per self report 3 out of 5 sessions documented  Outcome: Progressing Goal: Develop healthy thinking patterns and beliefs about self, others, and the world that lead to the alleviation and help prevent the relapse of depression per self report 3 out of 5 sessions documented.   Outcome: Progressing Intervention: Assist with relaxation techniques, as appropriate (deep breathing exercises, meditation, guided imagery) Note: Reviewed  Intervention: Encourage verbalization of feelings/concerns/expectations Note: Explored/expressed thoughts and feelings

## 2022-02-22 ENCOUNTER — Ambulatory Visit (INDEPENDENT_AMBULATORY_CARE_PROVIDER_SITE_OTHER): Payer: Medicare Other | Admitting: Licensed Clinical Social Worker

## 2022-02-22 DIAGNOSIS — F4321 Adjustment disorder with depressed mood: Secondary | ICD-10-CM

## 2022-02-22 NOTE — Plan of Care (Signed)
  Problem: Anxiety Disorder  Goal:  Reduce overall frequency, intensity, and duration of the anxiety so that daily functioning is not impaired per pt self report 3 out of 5 sessions documented.   Outcome: Progressing Goal: Enhance ability to effectively cope with the full variety of life's worries and anxiety per self report 3 out of 5 sessions documented  Outcome: Progressing Goal: Develop healthy thinking patterns and beliefs about self, others, and the world that lead to the alleviation and help prevent the relapse of depression per self report 3 out of 5 sessions documented.   Outcome: Progressing Intervention: REVIEW PLEASE SKILLS (TREAT PHYSICAL ILLNESS, BALANCE EATING, AVOID MOOD-ALTERING SUBSTANCES, BALANCE SLEEP AND GET EXERCISE) WITH Shanyla Note: Assessed current symptoms: encouraged stimulating activities/behaviors  Intervention: Encourage verbalization of feelings/concerns/expectations Note: Allowed pt to explore/express and identify personal goals

## 2022-02-22 NOTE — Progress Notes (Signed)
Virtual Visit via Audio Note  I connected with Lori Wagner on 02/22/22 at 10:00 AM EDT by an audio  enabled telemedicine application and verified that I am speaking with the correct person using two identifiers.  Video connection was lost when less than 50% of the duration of the visit was complete, at which time the remainder of the visit was completed via audio only.  Location: Patient: home Provider: remote office [place], [place])   I discussed the limitations of evaluation and management by telemedicine and the availability of in person appointments. The patient expressed understanding and agreed to proceed.  I discussed the assessment and treatment plan with the patient. The patient was provided an opportunity to ask questions and all were answered. The patient agreed with the plan and demonstrated an understanding of the instructions.   The patient was advised to call back or seek an in-person evaluation if the symptoms worsen or if the condition fails to improve as anticipated.  I provided 50 minutes of non-face-to-face time during this encounter.   Sina Lucchesi R Summerlynn Glauser, LCSW   THERAPIST PROGRESS NOTE  Session Time: 10-1050a  Participation Level: Active  Behavioral Response: NAAlertAnxious  Type of Therapy: Individual Therapy  Treatment Goals addressed: Problem: Anxiety Disorder  Goal:  Reduce overall frequency, intensity, and duration of the anxiety so that daily functioning is not impaired per pt self report 3 out of 5 sessions documented.   Outcome: Progressing Goal: Enhance ability to effectively cope with the full variety of life's worries and anxiety per self report 3 out of 5 sessions documented  Outcome: Progressing Goal: Develop healthy thinking patterns and beliefs about self, others, and the world that lead to the alleviation and help prevent the relapse of depression per self report 3 out of 5 sessions documented.   Outcome: Progressing Intervention:  REVIEW PLEASE SKILLS (TREAT PHYSICAL ILLNESS, BALANCE EATING, AVOID MOOD-ALTERING SUBSTANCES, BALANCE SLEEP AND GET EXERCISE) WITH Lori Wagner Note: Assessed current symptoms: encouraged stimulating activities/behaviors  Intervention: Encourage verbalization of feelings/concerns/expectations Note: Allowed pt to explore/express and identify personal goals   ProgressTowards Goals: Progressing  Interventions: CBT and Supportive  Summary: Lori Wagner is a 71 y.o. female who presents with continuing symptoms related to adjustment disorder.Pt reports that she is compliant with using coping skills to manage current symptoms.   Allowed pt to explore and express thoughts and feelings associated with recent life situations and external stressors. Explored recent med change to Cymbalta. Pt doesn't feel the meds are working. Encouraged pt to allow additional time to help and to contact prescriber if she feels that another change is needed.   Explored pts life living in other places versus [place] area. Pt feels that she has no friends and sees other friends with "more friends". Compared perspectives/lives and allowed pt to explore her own path and how it may be different paths. Allowed pt to share her perspectives and interpretations of how social engagement changes throughout the lifespan.   Encouraged pt to develop stimulating activities/groups/behaviors that will put her alongside like minded individuals with the mindset that social connections could be made.   Asked pt to clarify statements of feeling as if choices that others made were intentionally about her. Pt stated that she asked a friend about getting together and friend stated "I will see what I have going on closer to the time you get here". Pt took this as a rejection comment. Explored differing perspectives. Pt attentive and cooperative throughout session.   Continued recommendations are as  follows: self care behaviors, positive social  engagements, focusing on overall work/home/life balance, and focusing on positive physical and emotional wellness.  .   Suicidal/Homicidal: No  Therapist Response: Pt is continuing to apply interventions learned in session into daily life situations. Pt is currently on track to meet goals utilizing interventions mentioned above. Personal growth and progress noted. Treatment to continue as indicated.   Plan: Return again in 4 weeks.  Diagnosis:  Encounter Diagnosis  Name Primary?   Adjustment disorder with depressed mood Yes   Collaboration of Care: Other n/a at time of session  Patient/Guardian was advised Release of Information must be obtained prior to any record release in order to collaborate their care with an outside provider. Patient/Guardian was advised if they have not already done so to contact the registration department to sign all necessary forms in order for [place] to release information regarding their care.   Consent: Patient/Guardian gives verbal consent for treatment and assignment of benefits for services provided during this visit. Patient/Guardian expressed understanding and agreed to proceed.   [place], LCSW 02/22/2022

## 2022-04-25 ENCOUNTER — Ambulatory Visit (HOSPITAL_COMMUNITY): Payer: Medicare Other | Admitting: Licensed Clinical Social Worker

## 2022-04-25 ENCOUNTER — Encounter (HOSPITAL_COMMUNITY): Payer: Self-pay

## 2022-05-21 ENCOUNTER — Ambulatory Visit: Payer: Medicare Other | Admitting: Physician Assistant

## 2022-08-27 ENCOUNTER — Other Ambulatory Visit: Payer: Self-pay | Admitting: Family Medicine

## 2022-08-27 DIAGNOSIS — Z1231 Encounter for screening mammogram for malignant neoplasm of breast: Secondary | ICD-10-CM

## 2022-10-01 ENCOUNTER — Ambulatory Visit
Admission: RE | Admit: 2022-10-01 | Discharge: 2022-10-01 | Disposition: A | Discharge status: Home / Self Care | Payer: Medicare Other | Source: Ambulatory Visit | Attending: Family Medicine | Admitting: Family Medicine

## 2022-10-01 DIAGNOSIS — Z1231 Encounter for screening mammogram for malignant neoplasm of breast: Secondary | ICD-10-CM | POA: Insufficient documentation

## 2023-02-10 ENCOUNTER — Other Ambulatory Visit: Payer: Self-pay | Admitting: Family Medicine

## 2023-02-10 DIAGNOSIS — M5416 Radiculopathy, lumbar region: Secondary | ICD-10-CM

## 2023-02-24 ENCOUNTER — Inpatient Hospital Stay: Payer: Medicare Other

## 2023-02-24 ENCOUNTER — Inpatient Hospital Stay: Payer: Medicare Other | Attending: Internal Medicine | Admitting: Internal Medicine

## 2023-02-24 ENCOUNTER — Encounter: Payer: Self-pay | Admitting: Internal Medicine

## 2023-02-24 VITALS — BP 123/73 | HR 59 | Temp 98.4°F | Ht 65.0 in | Wt 175.0 lb

## 2023-02-24 DIAGNOSIS — D472 Monoclonal gammopathy: Secondary | ICD-10-CM | POA: Insufficient documentation

## 2023-02-24 NOTE — Assessment & Plan Note (Addendum)
#   SEP 2024- 0.2 g,/dl [Rhem; Dr.DefoorI-] -Immunofixation shows IgM monoclonal protein with lambda light chain specificity. Hb/platelets/WBC; Calcium; creatine- WNL.  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   # MGUS-long discussion with the patient regarding natural history of MGUS; small risk of progression to Lymphoma/ multiple myeloma. Patient is less likely to have any active disease at this time patient.   # Discussed the role of bone marrow biopsy for further characterization-risk stratification etc.  However I think is reasonable to hold off any further invasive procedure at this time-unless there is a rapid progression of disease. Will re-check MM panel; K/L light chains- in 6 months.    # Bil.multiple joints- s/p Evaluation with Rheumatology- stable.   # 2022 [Dr.Vaught; Bx- held]Solitary enlarged left submandibular node which measures 15 x 7 mm [stable in 2019]- monitor for now.   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  # CAD [s/p CABG, 2001]- on asprin   # Hx of Obesity [s/p gastric sleeve- 220-155]- stable.   # Family Hx of Ovarian cancer [s/p genetic testing]- NEG [as per patient].   Thank you Dr.Defoor for allowing me to participate in the care of your pleasant patient. Please do not hesitate to contact me with questions or concerns in the interim.  # DISPOSITION: # NO labs today # follow up in 6 month- MD; 2 weeks prior- labs -MM panel; cbc/cmp; K/l light chains- Dr.B

## 2023-02-24 NOTE — Progress Notes (Signed)
Has trouble sleeping, takes trazodone.  Has constipation, uses miralax.

## 2023-02-24 NOTE — Progress Notes (Signed)
[facility] CONSULT NOTE  Patient Care Team: Jerl Mina, MD as PCP - General (Family Medicine) Earna Coder, MD as Consulting Physician (Oncology)  CHIEF COMPLAINTS/PURPOSE OF CONSULTATION: Monoclonal gammopathy  HEMATOLOGY HISTORY  # CKD stage- [Dr.]  HISTORY OF PRESENTING ILLNESS: Patient ambulating-independently. Accompanied by husband.   Lori Wagner 72 y.o.  female has been referred to [place] for further evaluation/work-up for monoclonal gammopathy. Otherwise, No unusual bone pain.   Patient noted to have elevated M protein as part of workup of bilateral arthritis by Rheumatology.    Chronic mild fatigue.  Tingling numbness in extremities- denies at this time.   Review of Systems  Constitutional:  Negative for chills, diaphoresis, fever, malaise/fatigue and weight loss.  HENT:  Negative for nosebleeds and sore throat.   Eyes:  Negative for double vision.  Respiratory:  Negative for cough, hemoptysis, sputum production, shortness of breath and wheezing.   Cardiovascular:  Negative for chest pain, palpitations, orthopnea and leg swelling.  Gastrointestinal:  Negative for abdominal pain, blood in stool, constipation, diarrhea, heartburn, melena, nausea and vomiting.  Genitourinary:  Negative for dysuria, frequency and urgency.  Musculoskeletal:  Positive for back pain and joint pain.  Skin: Negative.  Negative for itching and rash.  Neurological:  Negative for dizziness, tingling, focal weakness, weakness and headaches.  Endo/Heme/Allergies:  Does not bruise/bleed easily.  Psychiatric/Behavioral:  Negative for depression. The patient is not nervous/anxious and does not have insomnia.     MEDICAL HISTORY:  Past Medical History:  Diagnosis Date   Actinic keratosis    Anxiety    Arthritis    knees,hip , back and hands   Chronic kidney disease    Hx of kidney stones, years ago   Coronary artery disease    Dysrhythmia    SVT- controlled with  Beta blocker   Family history of bone cancer    Family history of lung cancer    Family history of ovarian cancer    Fatigue    HOH (hard of hearing)    tinnitis both ears   Hypertension     can cause lightheadedness   Motion sickness    Neuromuscular disorder (HCC)    carpal tunnel both hands   PONV (postoperative nausea and vomiting)    Post herpetic neuralgia    left side of face   RA (rheumatoid arthritis) (HCC)    Shingles 02/2017   left side of face   Sleep apnea    Hx of/ when overweight, no CPAP   SVT (supraventricular tachycardia)    Thyroid disease    HX OF/ NO LONGER TAKING THYROID MEDS, THYROID TESTS CAME BACK NORMAL   Vertigo    Hx of    SURGICAL HISTORY: Past Surgical History:  Procedure Laterality Date   ANKLE SURGERY Left    APPENDECTOMY     BLADDER REPAIR     CARDIAC CATHETERIZATION     CARDIAC SURGERY     CATARACT EXTRACTION W/PHACO Left 06/03/2017   Procedure: CATARACT EXTRACTION PHACO AND INTRAOCULAR LENS PLACEMENT (IOC)  LEFT;  Surgeon: Nevada Crane, MD;  Location: [facility];  Service: Ophthalmology;  Laterality: Left;   CATARACT EXTRACTION W/PHACO Right 07/08/2017   Procedure: CATARACT EXTRACTION PHACO AND INTRAOCULAR LENS PLACEMENT (IOC) RIGHT;  Surgeon: Nevada Crane, MD;  Location: [facility];  Service: Ophthalmology;  Laterality: Right;   COLONOSCOPY N/A 01/08/2017   Procedure: COLONOSCOPY;  Surgeon: Toney Reil, MD;  Location: [facility];  Service:  Gastroenterology;  Laterality: N/A;   CORONARY ARTERY BYPASS GRAFT  2001   x4   GASTRIC BYPASS     SLEEVE   HAND SURGERY Right    2 TRIGGER FINGERS   KNEE ARTHROSCOPY Right    renal colic     TRIGGER FINGER RELEASE Right 01/17/2016   Procedure: RELEASE TRIGGER FINGER/A-1 PULLEY THUMB;  Surgeon: Christena Flake, MD;  Location: [facility];  Service: Orthopedics;  Laterality: Right;    SOCIAL HISTORY: Social History   Socioeconomic History   Marital  status: Married    Spouse name: Greggory Stallion   Number of children: 1   Years of education: Not on file   Highest education level: Bachelor's degree (e.g., BA, AB, BS)  Occupational History    Comment: retired  Tobacco Use   Smoking status: Never   Smokeless tobacco: Never  Vaping Use   Vaping status: Never Used  Substance and Sexual Activity   Alcohol use: No   Drug use: No   Sexual activity: Never  Other Topics Concern   Not on file  Social History Narrative   Not on file   Social Determinants of Health   Financial Resource Strain: Medium Risk (08/15/2022)   Received from [facility], [facility]   Overall Financial Resource Strain (CARDIA)    Difficulty of Paying Living Expenses: Somewhat hard  Food Insecurity: No Food Insecurity (02/24/2023)   Hunger Vital Sign    Worried About Running Out of Food in the Last Year: Never true    Ran Out of Food in the Last Year: Never true  Transportation Needs: No Transportation Needs (02/24/2023)   PRAPARE - Administrator, Civil Service (Medical): No    Lack of Transportation (Non-Medical): No  Physical Activity: Inactive (04/15/2017)   Exercise Vital Sign    Days of Exercise per Week: 0 days    Minutes of Exercise per Session: 0 min  Stress: Stress Concern Present (04/15/2017)   [employer] of Occupational Health - Occupational Stress Questionnaire    Feeling of Stress : To some extent  Social Connections: Moderately Integrated (04/15/2017)   Social Connection and Isolation Panel [NHANES]    Frequency of Communication with Friends and Family: More than three times a week    Frequency of Social Gatherings with Friends and Family: Once a week    Attends Religious Services: Never    Database administrator or Organizations: Yes    Attends Engineer, structural: More than 4 times per year    Marital Status: Married  Catering manager Violence: Not At Risk (02/24/2023)   Humiliation, Afraid, Rape, and Kick  questionnaire    Fear of Current or Ex-Partner: No    Emotionally Abused: No    Physically Abused: No    Sexually Abused: No    FAMILY HISTORY: Family History  Problem Relation Age of Onset   Hypertension Mother    Coronary artery disease Mother    Arthritis Mother    Osteoporosis Mother    Depression Mother    Anxiety disorder Mother    Coronary artery disease Father    Stroke Father    Coronary artery disease Brother    Bipolar disorder Brother    Drug abuse Brother    Coronary artery disease Maternal Aunt    Osteoporosis Maternal Aunt    Coronary artery disease Cousin    Osteoporosis Cousin    Anxiety disorder Daughter    Depression Daughter  Bone cancer Paternal Uncle    Lung cancer Paternal Grandmother    Ovarian cancer Cousin 46       CHEK2+   Breast cancer Neg Hx     ALLERGIES:  is allergic to epinephrine, sulfa antibiotics, ace inhibitors, morphine and codeine, penicillins, and cynara scolymus (artichoke).  MEDICATIONS:  Current Outpatient Medications  Medication Sig Dispense Refill   acetaminophen (TYLENOL) 500 MG tablet Take 500-1,000 mg by mouth every 6 (six) hours as needed for mild pain, moderate pain or fever.     aspirin 81 MG EC tablet Take by mouth.     atorvastatin (LIPITOR) 80 MG tablet Take 80 mg by mouth at bedtime.      bisoprolol (ZEBETA) 5 MG tablet Take 2.5 mg by mouth every evening.      buPROPion ER (WELLBUTRIN SR) 100 MG 12 hr tablet Take 100 mg by mouth every morning.     busPIRone (BUSPAR) 15 MG tablet Take 1 tablet by mouth 2 (two) times daily.     carbamazepine (TEGRETOL) 200 MG tablet Take 1 tablet by mouth in the morning and at bedtime.     isosorbide mononitrate (IMDUR) 60 MG 24 hr tablet Take 60 mg by mouth daily.      levothyroxine (SYNTHROID) 75 MCG tablet Take by mouth.     nitroGLYCERIN (NITROSTAT) 0.4 MG SL tablet Place 0.4 mg under the tongue every 5 (five) minutes as needed for chest pain.     traZODone (DESYREL) 50 MG  tablet Take 1-2 tablets (50-100 mg total) by mouth at bedtime.     No current facility-administered medications for this visit.      PHYSICAL EXAMINATION:   Vitals:   02/24/23 1051  BP: 123/73  Pulse: (!) 59  Temp: 98.4 F (36.9 C)  SpO2: 95%   Filed Weights   02/24/23 1051  Weight: 175 lb (79.4 kg)    Physical Exam Vitals and nursing note reviewed.  HENT:     Head: Normocephalic and atraumatic.     Mouth/Throat:     Pharynx: Oropharynx is clear.  Eyes:     Extraocular Movements: Extraocular movements intact.     Pupils: Pupils are equal, round, and reactive to light.  Cardiovascular:     Rate and Rhythm: Normal rate and regular rhythm.  Pulmonary:     Comments: Decreased breath sounds bilaterally.  Abdominal:     Palpations: Abdomen is soft.  Musculoskeletal:        General: Normal range of motion.     Cervical back: Normal range of motion.  Skin:    General: Skin is warm.  Neurological:     General: No focal deficit present.     Mental Status: She is alert and oriented to person, place, and time.  Psychiatric:        Behavior: Behavior normal.        Judgment: Judgment normal.     LABORATORY DATA:  I have reviewed the data as listed Lab Results  Component Value Date   WBC 12.5 (H) 07/25/2021   HGB 14.5 07/25/2021   HCT 43.0 07/25/2021   MCV 94.1 07/25/2021   PLT 275 07/25/2021   No results for input(s): "NA", "K", "CL", "CO2", "GLUCOSE", "BUN", "CREATININE", "CALCIUM", "GFRNONAA", "GFRAA", "PROT", "ALBUMIN", "AST", "ALT", "ALKPHOS", "BILITOT", "BILIDIR", "IBILI" in the last 8760 hours.   No results found.  No results found for: "KPAFRELGTCHN", "LAMBDASER", "KAPLAMBRATIO"   Monoclonal gammopathy # SEP 2024- 0.2 g,/dl [Rhem; Dr.DefoorI-] -Immunofixation shows IgM  monoclonal protein with lambda light chain specificity. Hb/platelets/WBC; Calcium; creatine- WNL.  Clinically suggestive of most likely MGUS-monoclonal gammopathy of unknown  significance.   # MGUS-long discussion with the patient regarding natural history of MGUS; small risk of progression to Lymphoma/ multiple myeloma. Patient is less likely to have any active disease at this time patient.   # Discussed the role of bone marrow biopsy for further characterization-risk stratification etc.  However I think is reasonable to hold off any further invasive procedure at this time-unless there is a rapid progression of disease. Will re-check MM panel; K/L light chains- in 6 months.    # Bil.multiple joints- s/p Evaluation with Rheumatology- stable.   # 2022 [Dr.Vaught; Bx- held]Solitary enlarged left submandibular node which measures 15 x 7 mm [stable in 2019]- monitor for now.   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  # CAD [s/p CABG, 2001]- on asprin   # Hx of Obesity [s/p gastric sleeve- 220-155]- stable.   # Family Hx of Ovarian cancer [s/p genetic testing]- NEG [as per patient].   Thank you Dr.Defoor for allowing me to participate in the care of your pleasant patient. Please do not hesitate to contact me with questions or concerns in the interim.  # DISPOSITION: # NO labs today # follow up in 6 month- MD; 2 weeks prior- labs -MM panel; cbc/cmp; K/l light chains- Dr.B      All questions were answered. The patient knows to call the clinic with any problems, questions or concerns.      Earna Coder, MD 02/24/2023 2:18 PM

## 2023-03-05 ENCOUNTER — Ambulatory Visit
Admission: RE | Admit: 2023-03-05 | Discharge: 2023-03-05 | Disposition: A | Discharge status: Home / Self Care | Payer: Medicare Other | Source: Ambulatory Visit | Attending: Family Medicine | Admitting: Family Medicine

## 2023-03-05 DIAGNOSIS — M5416 Radiculopathy, lumbar region: Secondary | ICD-10-CM

## 2023-04-02 ENCOUNTER — Other Ambulatory Visit: Payer: Self-pay | Admitting: Medical Genetics

## 2023-04-02 DIAGNOSIS — Z006 Encounter for examination for normal comparison and control in clinical research program: Secondary | ICD-10-CM

## 2023-04-10 ENCOUNTER — Other Ambulatory Visit
Admission: RE | Admit: 2023-04-10 | Discharge: 2023-04-10 | Disposition: A | Discharge status: Home / Self Care | Payer: Medicare Other | Source: Ambulatory Visit | Attending: Medical Genetics | Admitting: Medical Genetics

## 2023-04-10 DIAGNOSIS — Z006 Encounter for examination for normal comparison and control in clinical research program: Secondary | ICD-10-CM | POA: Insufficient documentation

## 2023-04-21 LAB — HELIX MOLECULAR SCREEN: Genetic Analysis Overall Interpretation: NEGATIVE

## 2023-08-11 ENCOUNTER — Inpatient Hospital Stay: Payer: Medicare Other | Attending: Internal Medicine

## 2023-08-11 DIAGNOSIS — D472 Monoclonal gammopathy: Secondary | ICD-10-CM | POA: Diagnosis present

## 2023-08-11 DIAGNOSIS — N189 Chronic kidney disease, unspecified: Secondary | ICD-10-CM | POA: Diagnosis not present

## 2023-08-11 DIAGNOSIS — I129 Hypertensive chronic kidney disease with stage 1 through stage 4 chronic kidney disease, or unspecified chronic kidney disease: Secondary | ICD-10-CM | POA: Insufficient documentation

## 2023-08-11 DIAGNOSIS — Z79899 Other long term (current) drug therapy: Secondary | ICD-10-CM | POA: Insufficient documentation

## 2023-08-11 LAB — CMP (CANCER CENTER ONLY)
ALT: 17 U/L (ref 0–44)
AST: 22 U/L (ref 15–41)
Albumin: 4 g/dL (ref 3.5–5.0)
Alkaline Phosphatase: 76 U/L (ref 38–126)
Anion gap: 9 (ref 5–15)
BUN: 21 mg/dL (ref 8–23)
CO2: 25 mmol/L (ref 22–32)
Calcium: 9 mg/dL (ref 8.9–10.3)
Chloride: 104 mmol/L (ref 98–111)
Creatinine: 0.94 mg/dL (ref 0.44–1.00)
GFR, Estimated: >60 mL/min (ref >60)
Glucose, Bld: 134 mg/dL — ABNORMAL HIGH (ref 70–99)
Potassium: 3.5 mmol/L (ref 3.5–5.1)
Sodium: 138 mmol/L (ref 135–145)
Total Bilirubin: 0.6 mg/dL (ref 0.0–1.2)
Total Protein: 7.1 g/dL (ref 6.5–8.1)

## 2023-08-11 LAB — CBC WITH DIFFERENTIAL (CANCER CENTER ONLY)
Abs Immature Granulocytes: 0.02 10*3/uL (ref 0.00–0.07)
Basophils Absolute: 0 10*3/uL (ref 0.0–0.1)
Basophils Relative: 1 %
Eosinophils Absolute: 0.1 10*3/uL (ref 0.0–0.5)
Eosinophils Relative: 2 %
HCT: 44.2 % (ref 36.0–46.0)
Hemoglobin: 14.6 g/dL (ref 12.0–15.0)
Immature Granulocytes: 0 %
Lymphocytes Relative: 33 %
Lymphs Abs: 1.9 10*3/uL (ref 0.7–4.0)
MCH: 31.9 pg (ref 26.0–34.0)
MCHC: 33 g/dL (ref 30.0–36.0)
MCV: 96.7 fL (ref 80.0–100.0)
Monocytes Absolute: 0.5 10*3/uL (ref 0.1–1.0)
Monocytes Relative: 9 %
Neutro Abs: 3.1 10*3/uL (ref 1.7–7.7)
Neutrophils Relative %: 55 %
Platelet Count: 221 10*3/uL (ref 150–400)
RBC: 4.57 MIL/uL (ref 3.87–5.11)
RDW: 12.1 % (ref 11.5–15.5)
WBC Count: 5.6 10*3/uL (ref 4.0–10.5)
nRBC: 0 % (ref 0.0–0.2)

## 2023-08-11 NOTE — Progress Notes (Deleted)
 08/13/2023 4:38 PM   Lori Wagner 03-Aug-1950 [id number]  Referring provider: Jerl Mina, MD [address] [place],  [place] [address]  Urological history: 1.  High risk hematuria -Non-smoker -CTU (06/2021) - NED  -cysto (07/2021) - NED   2.  Nephrolithiasis -Ureteroscopy approximately 10 years ago  No chief complaint on file.  HPI: Lori Wagner is a 73 y.o. female who presents today for hematuria.    Previous records reviewed.   She had microscopic hematuria on a urinalysis in January and in February.  UA ***  PMH: Past Medical History:  Diagnosis Date   Actinic keratosis    Anxiety    Arthritis    knees,hip , back and hands   Chronic kidney disease    Hx of kidney stones, years ago   Coronary artery disease    Dysrhythmia    SVT- controlled with Beta blocker   Family history of bone cancer    Family history of lung cancer    Family history of ovarian cancer    Fatigue    HOH (hard of hearing)    tinnitis both ears   Hypertension     can cause lightheadedness   Motion sickness    Neuromuscular disorder (HCC)    carpal tunnel both hands   PONV (postoperative nausea and vomiting)    Post herpetic neuralgia    left side of face   RA (rheumatoid arthritis) (HCC)    Shingles 02/2017   left side of face   Sleep apnea    Hx of/ when overweight, no CPAP   SVT (supraventricular tachycardia)    Thyroid disease    HX OF/ NO LONGER TAKING THYROID MEDS, THYROID TESTS CAME BACK NORMAL   Vertigo    Hx of    Surgical History: Past Surgical History:  Procedure Laterality Date   ANKLE SURGERY Left    APPENDECTOMY     BLADDER REPAIR     CARDIAC CATHETERIZATION     CARDIAC SURGERY     CATARACT EXTRACTION W/PHACO Left 06/03/2017   Procedure: CATARACT EXTRACTION PHACO AND INTRAOCULAR LENS PLACEMENT (IOC)  LEFT;  Surgeon: Nevada Crane, MD;  Location: [facility];  Service: Ophthalmology;  Laterality: Left;    CATARACT EXTRACTION W/PHACO Right 07/08/2017   Procedure: CATARACT EXTRACTION PHACO AND INTRAOCULAR LENS PLACEMENT (IOC) RIGHT;  Surgeon: Nevada Crane, MD;  Location: [facility];  Service: Ophthalmology;  Laterality: Right;   COLONOSCOPY N/A 01/08/2017   Procedure: COLONOSCOPY;  Surgeon: Toney Reil, MD;  Location: [facility];  Service: Gastroenterology;  Laterality: N/A;   CORONARY ARTERY BYPASS GRAFT  2001   x4   GASTRIC BYPASS     SLEEVE   HAND SURGERY Right    2 TRIGGER FINGERS   KNEE ARTHROSCOPY Right    renal colic     TRIGGER FINGER RELEASE Right 01/17/2016   Procedure: RELEASE TRIGGER FINGER/A-1 PULLEY THUMB;  Surgeon: Christena Flake, MD;  Location: [facility];  Service: Orthopedics;  Laterality: Right;    Home Medications:  Allergies as of 08/13/2023       Reactions   Epinephrine Other (See Comments)   Other Reaction: Other reaction-may go into SVT Pt states she wants this taken out.   Sulfa Antibiotics Hives, Rash   Other Reaction: Other reaction   Ace Inhibitors Cough   Morphine And Codeine Nausea And Vomiting   Penicillins Hives   Cynara Scolymus (artichoke) Hives  Medication List        Accurate as of August 11, 2023  4:38 PM. If you have any questions, ask your nurse or doctor.          acetaminophen 500 MG tablet Commonly known as: TYLENOL Take 500-1,000 mg by mouth every 6 (six) hours as needed for mild pain, moderate pain or fever.   aspirin EC 81 MG tablet Take by mouth.   atorvastatin 80 MG tablet Commonly known as: LIPITOR Take 80 mg by mouth at bedtime.   bisoprolol 5 MG tablet Commonly known as: ZEBETA Take 2.5 mg by mouth every evening.   buPROPion ER 100 MG 12 hr tablet Commonly known as: WELLBUTRIN SR Take 100 mg by mouth every morning.   busPIRone 15 MG tablet Commonly known as: BUSPAR Take 1 tablet by mouth 2 (two) times daily.   carbamazepine 200 MG tablet Commonly known as:  TEGRETOL Take 1 tablet by mouth in the morning and at bedtime.   isosorbide mononitrate 60 MG 24 hr tablet Commonly known as: IMDUR Take 60 mg by mouth daily.   levothyroxine 75 MCG tablet Commonly known as: SYNTHROID Take by mouth.   nitroGLYCERIN 0.4 MG SL tablet Commonly known as: NITROSTAT Place 0.4 mg under the tongue every 5 (five) minutes as needed for chest pain.   traZODone 50 MG tablet Commonly known as: DESYREL Take 1-2 tablets (50-100 mg total) by mouth at bedtime.        Allergies:  Allergies  Allergen Reactions   Epinephrine Other (See Comments)    Other Reaction: Other reaction-may go into SVT Pt states she wants this taken out.   Sulfa Antibiotics Hives and Rash    Other Reaction: Other reaction   Ace Inhibitors Cough   Morphine And Codeine Nausea And Vomiting   Penicillins Hives   Cynara Scolymus (Artichoke) Hives    Family History: Family History  Problem Relation Age of Onset   Hypertension Mother    Coronary artery disease Mother    Arthritis Mother    Osteoporosis Mother    Depression Mother    Anxiety disorder Mother    Coronary artery disease Father    Stroke Father    Coronary artery disease Brother    Bipolar disorder Brother    Drug abuse Brother    Coronary artery disease Maternal Aunt    Osteoporosis Maternal Aunt    Coronary artery disease Cousin    Osteoporosis Cousin    Anxiety disorder Daughter    Depression Daughter    Bone cancer Paternal Uncle    Lung cancer Paternal Grandmother    Ovarian cancer Cousin 64       CHEK2+   Breast cancer Neg Hx     Social History:  reports that she has never smoked. She has never used smokeless tobacco. She reports that she does not drink alcohol and does not use drugs.  ROS: Pertinent ROS in HPI  Physical Exam: There were no vitals taken for this visit.  Constitutional:  Well nourished. Alert and oriented, No acute distress. HEENT: [place] AT, moist mucus membranes.  Trachea midline,  no masses. Cardiovascular: No clubbing, cyanosis, or edema. Respiratory: Normal respiratory effort, no increased work of breathing. GU: No CVA tenderness.  No bladder fullness or masses.  Recession of labia minora, dry, pale vulvar vaginal mucosa and loss of mucosal ridges and folds.  Normal urethral meatus, no lesions, no prolapse, no discharge.   No urethral masses, tenderness and/or tenderness. No  bladder fullness, tenderness or masses. *** vagina mucosa, *** estrogen effect, no discharge, no lesions, *** pelvic support, *** cystocele and *** rectocele noted.  No cervical motion tenderness.  Uterus is freely mobile and non-fixed.  No adnexal/parametria masses or tenderness noted.  Anus and perineum are without rashes or lesions.   ***  Neurologic: Grossly intact, no focal deficits, moving all 4 extremities. Psychiatric: Normal mood and affect.    Laboratory Data: Lab Results  Component Value Date   WBC 5.6 08/11/2023   HGB 14.6 08/11/2023   HCT 44.2 08/11/2023   MCV 96.7 08/11/2023   PLT 221 08/11/2023   Lab Results  Component Value Date   CREATININE 0.94 08/11/2023   Lab Results  Component Value Date   AST 22 08/11/2023   Lab Results  Component Value Date   ALT 17 08/11/2023   Recent Data from [facility] Related to Urinalysis w/Microscopic Component 07/23/23 06/19/23 09/12/22 06/01/21 05/03/21 05/17/20  Color Yellow Yellow Yellow Brown Abnormal  Yellow Yellow  Clarity Clear Clear Clear Cloudy Abnormal  Clear Clear  Specific Gravity 1.027 1.02 1.020 1.020 1.020 1.025  pH, Urine 5 7 6.5 6.5 6.0 5.0  Protein, Urinalysis Negative Negative Negative Negative Negative Negative  Glucose, Urinalysis 3+ Abnormal  3+ Abnormal  Negative Negative Negative Negative  Ketones, Urinalysis Negative Negative Negative Negative Negative Negative  Blood, Urinalysis 1+ Abnormal  Negative Trace Abnormal  Large Abnormal  Small Abnormal  Small Abnormal   Nitrite, Urinalysis  Negative Negative Negative Negative Negative Negative  Leukocyte Esterase, Urinalysis Negative Negative Negative Negative Negative Negative  Bilirubin, Urinalysis Negative Negative -- -- -- --  Urobilinogen, Urinalysis 0.2 0.2 -- -- -- --  WBC, UA 2 1 -- -- -- --  Red Blood Cells, Urinalysis 9 High  7 High  0-3 >50 Abnormal  0-3 4-10 Abnormal   Bacteria, Urinalysis 0-5 0-5 Rare Abnormal  Few Abnormal  Rare Abnormal  None Seen  Squamous Epithelial Cells, Urinalysis 0 0 Rare Rare Rare Rare   Urinalysis See EPIC and HPI  I have reviewed the labs.   Pertinent Imaging: ***   Assessment & Plan:  ***  1. High risk hematuria -non smoker -work up in 2023 NED -discussed that the guidelines for repeat work up for [facility] are a worsening of the degree of hematuria or persistent microscopic hematuria to repeat studies every 2 to 5 years and the decision is made through SDM and we need to consider a gynecological or nephrological cause for the micro heme   2. GSM  ***     No follow-ups on file.  These notes generated with voice recognition software. I apologize for typographical errors.  Cloretta Ned  [facility] [address]  [address] [place], [place] [address] [phone]

## 2023-08-12 LAB — KAPPA/LAMBDA LIGHT CHAINS
Kappa free light chain: 17.1 mg/L (ref 3.3–19.4)
Kappa, lambda light chain ratio: 0.82 (ref 0.26–1.65)
Lambda free light chains: 20.8 mg/L (ref 5.7–26.3)

## 2023-08-13 ENCOUNTER — Ambulatory Visit: Admitting: Urology

## 2023-08-13 DIAGNOSIS — N958 Other specified menopausal and perimenopausal disorders: Secondary | ICD-10-CM

## 2023-08-13 DIAGNOSIS — R3129 Other microscopic hematuria: Secondary | ICD-10-CM

## 2023-08-13 LAB — MULTIPLE MYELOMA PANEL, SERUM
Albumin SerPl Elph-Mcnc: 3.7 g/dL (ref 2.9–4.4)
Albumin/Glob SerPl: 1.5 (ref 0.7–1.7)
Alpha 1: 0.2 g/dL (ref 0.0–0.4)
Alpha2 Glob SerPl Elph-Mcnc: 0.6 g/dL (ref 0.4–1.0)
B-Globulin SerPl Elph-Mcnc: 1 g/dL (ref 0.7–1.3)
Gamma Glob SerPl Elph-Mcnc: 0.6 g/dL (ref 0.4–1.8)
Globulin, Total: 2.5 g/dL (ref 2.2–3.9)
IgA: 334 mg/dL (ref 64–422)
IgG (Immunoglobin G), Serum: 772 mg/dL (ref 586–1602)
IgM (Immunoglobulin M), Srm: 151 mg/dL (ref 26–217)
Total Protein ELP: 6.2 g/dL (ref 6.0–8.5)

## 2023-08-25 ENCOUNTER — Encounter: Payer: Self-pay | Admitting: Internal Medicine

## 2023-08-25 ENCOUNTER — Inpatient Hospital Stay: Payer: Medicare Other | Admitting: Internal Medicine

## 2023-08-25 VITALS — BP 117/68 | HR 97 | Temp 99.8°F | Resp 20 | Ht 65.0 in | Wt 169.8 lb

## 2023-08-25 DIAGNOSIS — D472 Monoclonal gammopathy: Secondary | ICD-10-CM

## 2023-08-25 NOTE — Assessment & Plan Note (Addendum)
#   SEP 2024- 0.2 g,/dl [Rhem; Dr.DefoorI-] -Immunofixation shows IgM monoclonal protein with lambda light chain specificity. Hb/platelets/WBC; Calcium; creatine- WNL.  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 MARCH 2025-negative M protein positive for M spike on immunofixation.  Kappa lambda light chain ratio normal.  CBC CMP normal.  I again reviewed the biology of M spike- regarding natural history of MGUS; small risk of progression to Lymphoma/ multiple myeloma. Patient is less likely to have any active disease at this time patient.  Would not recommend a bone marrow biopsy at this time.  Will also hold off any imaging at this time.    # Bil.multiple joints- s/p Evaluation with Rheumatology- stable.   # 2022 [Dr.Vaught; Bx- held]Solitary enlarged left submandibular node which measures 15 x 7 mm [stable in 2019]- monitor for now.  None noted today.  Discussed again the risk of lymphomas-if worsening to inform us.  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   # CAD [s/p CABG, 2001]- on asprin   # Hx of Obesity [s/p gastric sleeve- 220-155]- stable.   # Family Hx of Ovarian cancer [s/p genetic testing]- NEG [as per patient].   # DISPOSITION: # follow up in 12 month- MD; 2 weeks prior- labs -MM panel; cbc/cmp; K/l light chains- Dr.B

## 2023-08-25 NOTE — Progress Notes (Signed)
 [facility] CONSULT NOTE  Patient Care Team: Jerl Mina, MD as PCP - General (Family Medicine) Earna Coder, MD as Consulting Physician (Oncology)  CHIEF COMPLAINTS/PURPOSE OF CONSULTATION: Monoclonal gammopathy  HEMATOLOGY HISTORY  # CKD stage- [Dr.]  HISTORY OF PRESENTING ILLNESS: Patient ambulating-independently. Accompanied by husband.   Lori Wagner 73 y.o.  female has been IgM monoclonal gammopathy is here for follow-up/review labs.. Otherwise, No unusual bone pain.    Chronic mild fatigue.  Patient has lower extremity paresthesias.  Not any worse.    Review of Systems  Constitutional:  Negative for chills, diaphoresis, fever, malaise/fatigue and weight loss.  HENT:  Negative for nosebleeds and sore throat.   Eyes:  Negative for double vision.  Respiratory:  Negative for cough, hemoptysis, sputum production, shortness of breath and wheezing.   Cardiovascular:  Negative for chest pain, palpitations, orthopnea and leg swelling.  Gastrointestinal:  Negative for abdominal pain, blood in stool, constipation, diarrhea, heartburn, melena, nausea and vomiting.  Genitourinary:  Negative for dysuria, frequency and urgency.  Musculoskeletal:  Positive for back pain and joint pain.  Skin: Negative.  Negative for itching and rash.  Neurological:  Negative for dizziness, tingling, focal weakness, weakness and headaches.  Endo/Heme/Allergies:  Does not bruise/bleed easily.  Psychiatric/Behavioral:  Negative for depression. The patient is not nervous/anxious and does not have insomnia.     MEDICAL HISTORY:  Past Medical History:  Diagnosis Date   Actinic keratosis    Anxiety    Arthritis    knees,hip , back and hands   Chronic kidney disease    Hx of kidney stones, years ago   Coronary artery disease    Dysrhythmia    SVT- controlled with Beta blocker   Family history of bone cancer    Family history of lung cancer    Family history of ovarian  cancer    Fatigue    HOH (hard of hearing)    tinnitis both ears   Hypertension     can cause lightheadedness   Motion sickness    Neuromuscular disorder (HCC)    carpal tunnel both hands   PONV (postoperative nausea and vomiting)    Post herpetic neuralgia    left side of face   RA (rheumatoid arthritis) (HCC)    Shingles 02/2017   left side of face   Sleep apnea    Hx of/ when overweight, no CPAP   SVT (supraventricular tachycardia) (HCC)    Thyroid disease    HX OF/ NO LONGER TAKING THYROID MEDS, THYROID TESTS CAME BACK NORMAL   Vertigo    Hx of    SURGICAL HISTORY: Past Surgical History:  Procedure Laterality Date   ANKLE SURGERY Left    APPENDECTOMY     BLADDER REPAIR     CARDIAC CATHETERIZATION     CARDIAC SURGERY     CATARACT EXTRACTION W/PHACO Left 06/03/2017   Procedure: CATARACT EXTRACTION PHACO AND INTRAOCULAR LENS PLACEMENT (IOC)  LEFT;  Surgeon: Nevada Crane, MD;  Location: [facility];  Service: Ophthalmology;  Laterality: Left;   CATARACT EXTRACTION W/PHACO Right 07/08/2017   Procedure: CATARACT EXTRACTION PHACO AND INTRAOCULAR LENS PLACEMENT (IOC) RIGHT;  Surgeon: Nevada Crane, MD;  Location: [facility];  Service: Ophthalmology;  Laterality: Right;   COLONOSCOPY N/A 01/08/2017   Procedure: COLONOSCOPY;  Surgeon: Toney Reil, MD;  Location: [facility] END[facility]COPY;  Service: Gastroenterology;  Laterality: N/A;   CORONARY ARTERY BYPASS GRAFT  2001   x4  GASTRIC BYPASS     SLEEVE   HAND SURGERY Right    2 TRIGGER FINGERS   KNEE ARTHROSCOPY Right    renal colic     TRIGGER FINGER RELEASE Right 01/17/2016   Procedure: RELEASE TRIGGER FINGER/A-1 PULLEY THUMB;  Surgeon: Christena Flake, MD;  Location: [facility];  Service: Orthopedics;  Laterality: Right;    SOCIAL HISTORY: Social History   Socioeconomic History   Marital status: Married    Spouse name: Greggory Stallion   Number of children: 1   Years of education: Not on file    Highest education level: Bachelor's degree (e.g., BA, AB, BS)  Occupational History    Comment: retired  Tobacco Use   Smoking status: Never   Smokeless tobacco: Never  Vaping Use   Vaping status: Never Used  Substance and Sexual Activity   Alcohol use: No   Drug use: No   Sexual activity: Never  Other Topics Concern   Not on file  Social History Narrative   Not on file   Social Drivers of Health   Financial Resource Strain: Medium Risk (08/18/2023)   Received from [facility]   Overall Financial Resource Strain (CARDIA)    Difficulty of Paying Living Expenses: Somewhat hard  Food Insecurity: No Food Insecurity (08/18/2023)   Received from [facility]   Hunger Vital Sign    Worried About Running Out of Food in the Last Year: Never true    Ran Out of Food in the Last Year: Never true  Transportation Needs: No Transportation Needs (08/18/2023)   Received from [facility] - Transportation    In the past 12 months, has lack of transportation kept you from medical appointments or from getting medications?: No    Lack of Transportation (Non-Medical): No  Physical Activity: Inactive (04/15/2017)   Exercise Vital Sign    Days of Exercise per Week: 0 days    Minutes of Exercise per Session: 0 min  Stress: Stress Concern Present (04/15/2017)   [employer] of Occupational Health - Occupational Stress Questionnaire    Feeling of Stress : To some extent  Social Connections: Moderately Integrated (04/15/2017)   Social Connection and Isolation Panel [NHANES]    Frequency of Communication with Friends and Family: More than three times a week    Frequency of Social Gatherings with Friends and Family: Once a week    Attends Religious Services: Never    Database administrator or Organizations: Yes    Attends Engineer, structural: More than 4 times per year    Marital Status: Married  Catering manager Violence:  Not At Risk (02/24/2023)   Humiliation, Afraid, Rape, and Kick questionnaire    Fear of Current or Ex-Partner: No    Emotionally Abused: No    Physically Abused: No    Sexually Abused: No    FAMILY HISTORY: Family History  Problem Relation Age of Onset   Hypertension Mother    Coronary artery disease Mother    Arthritis Mother    Osteoporosis Mother    Depression Mother    Anxiety disorder Mother    Coronary artery disease Father    Stroke Father    Coronary artery disease Brother    Bipolar disorder Brother    Drug abuse Brother    Coronary artery disease Maternal Aunt    Osteoporosis Maternal Aunt    Coronary artery disease Cousin    Osteoporosis Cousin  Anxiety disorder Daughter    Depression Daughter    Bone cancer Paternal Uncle    Lung cancer Paternal Grandmother    Ovarian cancer Cousin 67       CHEK2+   Breast cancer Neg Hx     ALLERGIES:  is allergic to epinephrine, sulfa antibiotics, ace inhibitors, morphine and codeine, penicillins, and cynara scolymus (artichoke).  MEDICATIONS:  Current Outpatient Medications  Medication Sig Dispense Refill   acetaminophen (TYLENOL) 500 MG tablet Take 500-1,000 mg by mouth every 6 (six) hours as needed for mild pain, moderate pain or fever.     aspirin 81 MG EC tablet Take by mouth.     atorvastatin (LIPITOR) 80 MG tablet Take 80 mg by mouth at bedtime.      azelastine (ASTELIN) 0.1 % nasal spray Place 2 sprays into both nostrils 2 (two) times daily.     bisoprolol (ZEBETA) 5 MG tablet Take 2.5 mg by mouth every evening.      buPROPion ER (WELLBUTRIN SR) 100 MG 12 hr tablet Take 100 mg by mouth every morning.     busPIRone (BUSPAR) 15 MG tablet Take 1 tablet by mouth 2 (two) times daily.     ezetimibe (ZETIA) 10 MG tablet Take 1 tablet by mouth daily.     isosorbide mononitrate (IMDUR) 60 MG 24 hr tablet Take 60 mg by mouth daily.      JARDIANCE 10 MG TABS tablet Take 10 mg by mouth daily.     levothyroxine (SYNTHROID)  75 MCG tablet Take by mouth.     nitroGLYCERIN (NITROSTAT) 0.4 MG SL tablet Place 0.4 mg under the tongue every 5 (five) minutes as needed for chest pain.     traZODone (DESYREL) 50 MG tablet Take 1-2 tablets (50-100 mg total) by mouth at bedtime.     No current facility-administered medications for this visit.      PHYSICAL EXAMINATION:   Vitals:   08/25/23 1424  BP: 117/68  Pulse: 97  Resp: 20  Temp: 99.8 F (37.7 C)  SpO2: 98%    Filed Weights   08/25/23 1424  Weight: 169 lb 12.8 oz (77 kg)     Physical Exam Vitals and nursing note reviewed.  HENT:     Head: Normocephalic and atraumatic.     Mouth/Throat:     Pharynx: Oropharynx is clear.  Eyes:     Extraocular Movements: Extraocular movements intact.     Pupils: Pupils are equal, round, and reactive to light.  Cardiovascular:     Rate and Rhythm: Normal rate and regular rhythm.  Pulmonary:     Comments: Decreased breath sounds bilaterally.  Abdominal:     Palpations: Abdomen is soft.  Musculoskeletal:        General: Normal range of motion.     Cervical back: Normal range of motion.  Skin:    General: Skin is warm.  Neurological:     General: No focal deficit present.     Mental Status: She is alert and oriented to person, place, and time.  Psychiatric:        Behavior: Behavior normal.        Judgment: Judgment normal.     LABORATORY DATA:  I have reviewed the data as listed Lab Results  Component Value Date   WBC 5.6 08/11/2023   HGB 14.6 08/11/2023   HCT 44.2 08/11/2023   MCV 96.7 08/11/2023   PLT 221 08/11/2023   Recent Labs    08/11/23 1440  NA 138  K 3.5  CL 104  CO2 25  GLUCOSE 134*  BUN 21  CREATININE 0.94  CALCIUM 9.0  GFRNONAA >60  PROT 7.1  ALBUMIN 4.0  AST 22  ALT 17  ALKPHOS 76  BILITOT 0.6     No results found.  Lab Results  Component Value Date   KPAFRELGTCHN 17.1 08/11/2023   LAMBDASER 20.8 08/11/2023   KAPLAMBRATIO 0.82 08/11/2023     Monoclonal  gammopathy # SEP 2024- 0.2 g,/dl [Rhem; Dr.DefoorI-] -Immunofixation shows IgM monoclonal protein with lambda light chain specificity. Hb/platelets/WBC; Calcium; creatine- WNL.  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 MARCH 2025-negative M protein positive for M spike on immunofixation.  Kappa lambda light chain ratio normal.  CBC CMP normal.  I again reviewed the biology of M spike- regarding natural history of MGUS; small risk of progression to Lymphoma/ multiple myeloma. Patient is less likely to have any active disease at this time patient.  Would not recommend a bone marrow biopsy at this time.  Will also hold off any imaging at this time.    # Bil.multiple joints- s/p Evaluation with Rheumatology- stable.   # 2022 [Dr.Vaught; Bx- held]Solitary enlarged left submandibular node which measures 15 x 7 mm [stable in 2019]- monitor for now.  None noted today.  Discussed again the risk of lymphomas-if worsening to inform us.  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   # CAD [s/p CABG, 2001]- on asprin   # Hx of Obesity [s/p gastric sleeve- 220-155]- stable.   # Family Hx of Ovarian cancer [s/p genetic testing]- NEG [as per patient].   # DISPOSITION: # follow up in 12 month- MD; 2 weeks prior- labs -MM panel; cbc/cmp; K/l light chains- Dr.B   All questions were answered. The patient knows to call the clinic with any problems, questions or concerns.    Earna Coder, MD 08/25/2023 3:23 PM

## 2023-08-25 NOTE — Progress Notes (Signed)
 Has had a cold for the past week. Coughing, wheezing, fatigue. Covid negative.   Dr. Rosita Kea did a cortisone inj right knee since last visit. Knee xrays.

## 2023-08-27 ENCOUNTER — Ambulatory Visit: Admitting: Urology

## 2023-08-29 ENCOUNTER — Ambulatory Visit: Admitting: Physician Assistant

## 2023-08-29 VITALS — BP 121/75 | HR 73 | Ht 65.5 in | Wt 170.2 lb

## 2023-08-29 DIAGNOSIS — R3129 Other microscopic hematuria: Secondary | ICD-10-CM | POA: Diagnosis not present

## 2023-08-29 DIAGNOSIS — R3121 Asymptomatic microscopic hematuria: Secondary | ICD-10-CM | POA: Diagnosis not present

## 2023-08-29 LAB — MICROSCOPIC EXAMINATION: Epithelial Cells (non renal): >10 /HPF — AB (ref 0–10)

## 2023-08-29 LAB — URINALYSIS, COMPLETE
Bilirubin, UA: NEGATIVE
Leukocytes,UA: NEGATIVE
Nitrite, UA: NEGATIVE
RBC, UA: NEGATIVE
Specific Gravity, UA: 1.025 (ref 1.005–1.030)
Urobilinogen, Ur: 0.2 mg/dL (ref 0.2–1.0)
pH, UA: 5 (ref 5.0–7.5)

## 2023-08-29 LAB — BLADDER SCAN AMB NON-IMAGING: Scan Result: 18

## 2023-08-29 NOTE — Progress Notes (Signed)
 08/29/2023 2:36 PM   Lori Wagner 04-11-1951 [id number]  CC: Chief Complaint  Patient presents with   Hematuria   Urinary Frequency   HPI: Lori Wagner is a 73 y.o. female with PMH nephrolithiasis, microscopic hematuria with benign workup in 2023, and GSM who presents today for follow-up of microscopic hematuria.   Today she reports her PCP sent her back to [place] for ongoing microscopic hematuria.  She denies dysuria, flank pain, or gross hematuria.  She is no longer using topical vaginal estrogen cream.  In-office UA today positive for 3+ glucose, 1+ ketones, and trace protein; urine microscopy with 3-10 RBCs/HPF, >10 epithelial cells/hpf, and moderate bacteria.  PVR 18 mL.   PMH: Past Medical History:  Diagnosis Date   Actinic keratosis    Anxiety    Arthritis    knees,hip , back and hands   Chronic kidney disease    Hx of kidney stones, years ago   Coronary artery disease    Dysrhythmia    SVT- controlled with Beta blocker   Family history of bone cancer    Family history of lung cancer    Family history of ovarian cancer    Fatigue    HOH (hard of hearing)    tinnitis both ears   Hypertension     can cause lightheadedness   Motion sickness    Neuromuscular disorder (HCC)    carpal tunnel both hands   PONV (postoperative nausea and vomiting)    Post herpetic neuralgia    left side of face   RA (rheumatoid arthritis) (HCC)    Shingles 02/2017   left side of face   Sleep apnea    Hx of/ when overweight, no CPAP   SVT (supraventricular tachycardia) (HCC)    Thyroid disease    HX OF/ NO LONGER TAKING THYROID MEDS, THYROID TESTS CAME BACK NORMAL   Vertigo    Hx of    Surgical History: Past Surgical History:  Procedure Laterality Date   ANKLE SURGERY Left    APPENDECTOMY     BLADDER REPAIR     CARDIAC CATHETERIZATION     CARDIAC SURGERY     CATARACT EXTRACTION W/PHACO Left 06/03/2017   Procedure: CATARACT EXTRACTION PHACO AND INTRAOCULAR  LENS PLACEMENT (IOC)  LEFT;  Surgeon: Nevada Crane, MD;  Location: [facility];  Service: Ophthalmology;  Laterality: Left;   CATARACT EXTRACTION W/PHACO Right 07/08/2017   Procedure: CATARACT EXTRACTION PHACO AND INTRAOCULAR LENS PLACEMENT (IOC) RIGHT;  Surgeon: Nevada Crane, MD;  Location: [facility];  Service: Ophthalmology;  Laterality: Right;   COLONOSCOPY N/A 01/08/2017   Procedure: COLONOSCOPY;  Surgeon: Toney Reil, MD;  Location: [facility] ENDOSCOPY;  Service: Gastroenterology;  Laterality: N/A;   CORONARY ARTERY BYPASS GRAFT  2001   x4   GASTRIC BYPASS     SLEEVE   HAND SURGERY Right    2 TRIGGER FINGERS   KNEE ARTHROSCOPY Right    renal colic     TRIGGER FINGER RELEASE Right 01/17/2016   Procedure: RELEASE TRIGGER FINGER/A-1 PULLEY THUMB;  Surgeon: Christena Flake, MD;  Location: [facility];  Service: Orthopedics;  Laterality: Right;    Home Medications:  Allergies as of 08/29/2023       Reactions   Epinephrine Other (See Comments)   Other Reaction: Other reaction-may go into SVT Pt states she wants this taken out.   Sulfa Antibiotics Hives, Rash   Other Reaction: Other reaction   Ace Inhibitors Cough  Morphine And Codeine Nausea And Vomiting   Penicillins Hives   Cynara Scolymus (artichoke) Hives        Medication List        Accurate as of August 29, 2023  2:36 PM. If you have any questions, ask your nurse or doctor.          acetaminophen 500 MG tablet Commonly known as: TYLENOL Take 500-1,000 mg by mouth every 6 (six) hours as needed for mild pain, moderate pain or fever.   aspirin EC 81 MG tablet Take by mouth.   atorvastatin 80 MG tablet Commonly known as: LIPITOR Take 80 mg by mouth at bedtime.   azelastine 0.1 % nasal spray Commonly known as: ASTELIN Place 2 sprays into both nostrils 2 (two) times daily.   bisoprolol 5 MG tablet Commonly known as: ZEBETA Take 2.5 mg by mouth every evening.   buPROPion ER  100 MG 12 hr tablet Commonly known as: WELLBUTRIN SR Take 100 mg by mouth every morning.   busPIRone 15 MG tablet Commonly known as: BUSPAR Take 1 tablet by mouth 2 (two) times daily.   ezetimibe 10 MG tablet Commonly known as: ZETIA Take 1 tablet by mouth daily.   isosorbide mononitrate 60 MG 24 hr tablet Commonly known as: IMDUR Take 60 mg by mouth daily.   Jardiance 10 MG Tabs tablet Generic drug: empagliflozin Take 10 mg by mouth daily.   levothyroxine 75 MCG tablet Commonly known as: SYNTHROID Take by mouth.   nitroGLYCERIN 0.4 MG SL tablet Commonly known as: NITROSTAT Place 0.4 mg under the tongue every 5 (five) minutes as needed for chest pain.   traZODone 50 MG tablet Commonly known as: DESYREL Take 1-2 tablets (50-100 mg total) by mouth at bedtime.        Allergies:  Allergies  Allergen Reactions   Epinephrine Other (See Comments)    Other Reaction: Other reaction-may go into SVT Pt states she wants this taken out.   Sulfa Antibiotics Hives and Rash    Other Reaction: Other reaction   Ace Inhibitors Cough   Morphine And Codeine Nausea And Vomiting   Penicillins Hives   Cynara Scolymus (Artichoke) Hives    Family History: Family History  Problem Relation Age of Onset   Hypertension Mother    Coronary artery disease Mother    Arthritis Mother    Osteoporosis Mother    Depression Mother    Anxiety disorder Mother    Coronary artery disease Father    Stroke Father    Coronary artery disease Brother    Bipolar disorder Brother    Drug abuse Brother    Coronary artery disease Maternal Aunt    Osteoporosis Maternal Aunt    Coronary artery disease Cousin    Osteoporosis Cousin    Anxiety disorder Daughter    Depression Daughter    Bone cancer Paternal Uncle    Lung cancer Paternal Grandmother    Ovarian cancer Cousin 98       CHEK2+   Breast cancer Neg Hx     Social History:   reports that she has never smoked. She has never used  smokeless tobacco. She reports that she does not drink alcohol and does not use drugs.  Physical Exam: BP 121/75   Pulse 73   Ht 5' 5.5" (1.664 m)   Wt 170 lb 4 oz (77.2 kg)   BMI 27.90 kg/m   Constitutional:  Alert and oriented, no acute distress, nontoxic appearing HEENT: [place],  AT Cardiovascular: No clubbing, cyanosis, or edema Respiratory: Normal respiratory effort, no increased work of breathing Skin: No rashes, bruises or suspicious lesions Neurologic: Grossly intact, no focal deficits, moving all 4 extremities Psychiatric: Normal mood and affect  Laboratory Data: Results for orders placed or performed in visit on 08/29/23  Microscopic Examination   Collection Time: 08/29/23  2:04 PM   Urine  Result Value Ref Range   WBC, UA 0-5 0 - 5 /hpf   RBC, Urine 3-10 (A) 0 - 2 /hpf   Epithelial Cells (non renal) >10 (A) 0 - 10 /hpf   Casts Present (A) None seen /lpf   Cast Type Granular casts (A) N/A   Mucus, UA Present (A) Not Estab.   Bacteria, UA Moderate (A) None seen/Few  Urinalysis, Complete   Collection Time: 08/29/23  2:04 PM  Result Value Ref Range   Specific Gravity, UA 1.025 1.005 - 1.030   pH, UA 5.0 5.0 - 7.5   Color, UA Yellow Yellow   Appearance Ur Clear Clear   Leukocytes,UA Negative Negative   Protein,UA Trace (A) Negative/Trace   Glucose, UA 3+ (A) Negative   Ketones, UA 1+ (A) Negative   RBC, UA Negative Negative   Bilirubin, UA Negative Negative   Urobilinogen, Ur 0.2 0.2 - 1.0 mg/dL   Nitrite, UA Negative Negative   Microscopic Examination See below:   BLADDER SCAN AMB NON-IMAGING   Collection Time: 08/29/23  2:07 PM  Result Value Ref Range   Scan Result 18 ml    Assessment & Plan:   1. Asymptomatic microscopic hematuria (Primary) Persistent, asymptomatic microscopic hematuria.  Her last benign workup was about 2 years ago.  Will send urine for cytology today and repeat workup if abnormal, otherwise we will plan to continue with annual monitoring and  repeat hematuria workup every 3 to 5 years or sooner if she has significant changes including dysuria, flank pain, or gross hematuria.  She is in agreement with this plan. - CULTURE, URINE COMPREHENSIVE - Urinalysis, Complete - BLADDER SCAN AMB NON-IMAGING   Return in about 1 year (around 08/28/2024) for Annual MH follow up with UA.  Carman Ching, PA-C  [facility] [place] [address] [place], [place] [address] [phone]

## 2023-09-01 DIAGNOSIS — R3121 Asymptomatic microscopic hematuria: Secondary | ICD-10-CM

## 2023-09-02 LAB — CULTURE, URINE COMPREHENSIVE

## 2023-09-09 ENCOUNTER — Telehealth: Payer: Self-pay | Admitting: Physician Assistant

## 2023-09-09 NOTE — Telephone Encounter (Signed)
 Urine cytology finalized with renal tubular casts, in addition to granular casts seen on in office UA.  Reached out to the patient via MyChart and recommended nephrology referral.  Will refer if she is in agreement.

## 2023-09-10 ENCOUNTER — Other Ambulatory Visit: Payer: Self-pay | Admitting: Family Medicine

## 2023-09-10 DIAGNOSIS — Z1231 Encounter for screening mammogram for malignant neoplasm of breast: Secondary | ICD-10-CM

## 2023-10-15 ENCOUNTER — Encounter

## 2023-10-15 ENCOUNTER — Ambulatory Visit
Admission: RE | Admit: 2023-10-15 | Discharge: 2023-10-15 | Disposition: A | Discharge status: Home / Self Care | Source: Ambulatory Visit | Attending: Family Medicine | Admitting: Family Medicine

## 2023-10-15 DIAGNOSIS — Z1231 Encounter for screening mammogram for malignant neoplasm of breast: Secondary | ICD-10-CM | POA: Diagnosis present

## 2023-10-16 ENCOUNTER — Other Ambulatory Visit: Payer: Self-pay | Admitting: Nephrology

## 2023-10-16 DIAGNOSIS — R319 Hematuria, unspecified: Secondary | ICD-10-CM

## 2023-10-24 NOTE — Progress Notes (Signed)
 Chief Complaint: Chief Complaint  Patient presents with  . Injections    Orthovisc #2-Rt knee   Lori Wagner Cheryal is a 73 y.o. female who presents today for her second right knee Orthovisc injection.  The patient underwent her first right knee Orthovisc injection last week and she does report that after several days she did experience some relief however the pain is gradually returned to the right knee.  She denies any trauma or injury affecting right knee since her last evaluation.  She denies any numbness or ting of the right lower extremity.  Denies any side effects from the previous injection.  The patient reports a 7 out of 10 pain score at today's visit.   Past Medical History: Past Medical History:  Diagnosis Date  . Allergic state 1995   Hay fever mostly  . Anxiety 2003  . Arrhythmia 1996   SVT controlled with beta-blocker  . Arthritis    Knees, hands  . Cataract cortical, senile   . Constipation   . Coronary artery disease   . Degenerative disc disease, lumbar   . Depression   . Encounter for blood transfusion 2001   Internal bleeding following cath  . GERD (gastroesophageal reflux disease) 2000   No longer a problem  . Heart disease   . History of blood in urine   . History of kidney stones   . History of motion sickness   . History of prolapse of bladder   . Hyperlipidemia 1995   Under control with meds  . Hypertension   . Migraine   . PONV (postoperative nausea and vomiting)   . Sleep apnea    Under control with weight loss  . Thyroid  disease     Past Surgical History: Past Surgical History:  Procedure Laterality Date  . BUNION CORRECTION  1998  . APPENDECTOMY  2000  . CORONARY ARTERY BYPASS GRAFT  2001  . CORONARY ANGIOPLASTY  2001  . Release right trigger thumb  Right 01/17/2016   Dr.Poggi   . FUSION GREAT TOE Left 11/03/2018   Procedure: ARTHRODESIS, GREAT TOE; METATARSOPHALANGEAL JOINT;  Surgeon: Reda Mardell Blas, MD;  Location: [facility] OR;   Service: Orthopedics;  Laterality: Left;  . OSTECTOMY METATARSAL HEAD CLAYTON PROCEDURE Left 11/03/2018   Procedure: OSTECTOMY, COMPLETE EXCISION; ALL METATARSAL HEADS, WITH PARTIAL PROXIMAL PHALANGECTOMY, EXCLUDING FIRST METATARSAL (EG, CLAYTON TYPE PROCEDURE);  Surgeon: Reda Mardell Blas, MD;  Location: [facility] OR;  Service: Orthopedics;  Laterality: Left;  . INTRAOPERATIVE FLUOROSCOPY Left 11/03/2018   Procedure: FLUOROSCOPY;  Surgeon: Reda Mardell Blas, MD;  Location[facility] OR;  Service: Orthopedics;  Laterality: Left;  . INJECTION ANESTHETIC AGENT OTHER NERVE OR BRANCH Left 11/03/2018   Procedure: INJECTION(S), ANESTHETIC AGENT(S) AND/OR STEROID; OTHER PERIPHERAL NERVE OR BRANCH;  Surgeon: Reda Mardell Blas, MD;  Location: [facility] OR;  Service: Orthopedics;  Laterality: Left;  . cardiac cath  with stent placement  2001, 2005  . COLONOSCOPY  [facility])  . COLONOSCOPY  03/05/2017 [facility])  . KNEE ARTHROSCOPY    . LAPAROSCOPIC GASTRIC BYPASS      Past Family History: Family History  Problem Relation Age of Onset  . Arthritis Mother   . Dementia Mother   . Heart disease Mother   . Colon polyps Mother   . Coronary Artery Disease (Blocked arteries around heart) Mother   . Hip fracture Mother   . High blood pressure (Hypertension) Mother   . Osteoarthritis Mother   . Osteoporosis (Thinning of bones) Mother   .  Alzheimer's disease Mother        Vascular  . Hyperlipidemia (Elevated cholesterol) Mother   . Obesity Mother   . Stroke Mother        Maybe  . Heart disease Father   . Coronary Artery Disease (Blocked arteries around heart) Father   . Stroke Father        Cerebral Hemorrhage  . Anxiety Brother   . Bipolar disorder Brother   . Depression Brother   . Myocardial Infarction (Heart attack) Brother   . Coronary Artery Disease (Blocked arteries around heart) Brother   . Obesity Brother   . Anxiety Daughter   . Depression Daughter   . Coronary Artery Disease (Blocked  arteries around heart) Brother   . Heart disease Brother   . Myocardial Infarction (Heart attack) Brother 43  . Coronary Artery Disease (Blocked arteries around heart) Maternal Aunt   . Osteoporosis (Thinning of bones) Maternal Aunt   . Osteoarthritis Maternal Aunt   . Coronary Artery Disease (Blocked arteries around heart) Maternal Grandmother   . Coronary Artery Disease (Blocked arteries around heart) Maternal Grandfather     Medications: Current Outpatient Medications  Medication Sig Dispense Refill  . aspirin  81 MG EC tablet Take 81 mg by mouth once daily    . atorvastatin  (LIPITOR) 80 MG tablet Take 80 mg by mouth nightly       . bisoprolol  (ZEBETA ) 5 MG tablet Take 2.5 mg by mouth once daily       . buPROPion  (WELLBUTRIN  SR) 100 MG SR tablet Take 1 tablet (100 mg total) by mouth once daily 90 each 3  . buPROPion  (WELLBUTRIN  SR) 200 MG SR tablet Take 1 tablet (200 mg total) by mouth once daily 90 each 3  . busPIRone (BUSPAR) 15 MG tablet Take 1 tablet (15 mg total) by mouth 2 (two) times daily 180 tablet 3  . cholecalciferol  1000 unit tablet Take by mouth    . DULoxetine  (CYMBALTA ) 60 MG DR capsule TAKE 1 CAPSULE DAILY 90 capsule 3  . empagliflozin (JARDIANCE) 10 mg tablet Take 1 tablet by mouth once daily    . ezetimibe (ZETIA) 10 mg tablet Take 1 tablet by mouth once daily    . gabapentin  (NEURONTIN ) 100 MG capsule 1 po qHS 30 capsule 1  . isosorbide  mononitrate (IMDUR ) 60 MG ER tablet TAKE 1 TABLET BY MOUTH  DAILY    . levothyroxine (SYNTHROID) 100 MCG tablet TAKE 1 TABLET ONCE DAILY (TAKE ON AN EMPTY STOMACH, WITH A GLASS OF WATER AT LEAST 30 TO 60 MINUTES BEFORE BREAKFAST) 90 tablet 3  . nitroGLYcerin  (NITROSTAT ) 0.4 MG SL tablet Place 0.4 mg under the tongue every 5 (five) minutes as needed for Chest pain May take up to 3 doses.    . polyethylene glycol (MIRALAX ) powder Take by mouth    . traMADoL (ULTRAM) 50 mg tablet Take 1 tablet (50 mg total) by mouth 2 (two) times daily as  needed 1 po bid prn 60 tablet 0  . traZODone  (DESYREL ) 50 MG tablet TAKE 2 TABLETS AT BEDTIME 180 tablet 3   No current facility-administered medications for this visit.    Allergies: Allergies  Allergen Reactions  . Ace Inhibitors Other (See Comments)    Cough and BP drops   . Epinephrine  Other (See Comments)    SVT  . Sulfa (Sulfonamide Antibiotics) Other (See Comments) and Hives    Other Reaction: Other reaction  . Morphine  Nausea And Vomiting  . Opioids -  Morphine  Analogues Nausea And Vomiting  . Sulfasalazine Hives  . Artichoke Hives  . Penicillins Hives     Review of Systems:  A comprehensive 14 point ROS was performed, reviewed by me today, and the pertinent orthopaedic findings are documented in the HPI.   Exam: BP 126/82   Ht 167.6 cm (5' 6)   Wt 75.9 kg (167 lb 6.4 oz)   BMI 27.02 kg/m  General/Constitutional: The patient appears to be well-nourished, well-developed, and in no acute distress. Neuro/Psych: Normal mood and affect, oriented to person, place and time.  Orthovisc Injection Procedure:  The risks, benefits and alternatives of the proposed injection were discussed with patient.  The patient states that she understands these and she verbally consented to proceed.  The patient's right  Anterolateral region was cleaned with alcohol solution and then ethylchloride anesthetic spray was utilized. The patient was injected with 3 cc's of 1% lidocaine  for anesthetic effect, using a 25 gauge needle.  Using aseptic technique, an arthrocentesis procedure was performed with an injection of 2 cc's Orthovisc, using a 23 gauge needle.   Patient tolerated this well without any complications.  She was counseled as to expected post injection course, including the possibility of temporary worsening of symptoms.  She was instructed as to concerning symptoms or signs, and was  instructed to contact our office if these should appear.  Imaging: None.  Impression: Primary  osteoarthritis of right knee [M17.11] Primary osteoarthritis of right knee  (primary encounter diagnosis)  Plan:  1.  Treatment options were discussed today with the patient. 2.  The patient tolerated the above injection without any complications. 3.  She was instructed to decrease her activities over the next 24-48 hours.  Gradually increase activities after this timeframe. 4.  The patient will follow-up with me in 1 week to undergo repeat right knee injection.  They can call the clinic they have any questions, new symptoms develop or symptoms worsen.  This office visit took 30 minutes, of which >50% involved patient counseling/education.  Review of the [place] CSRS was performed in accordance of the [employer] prior to dispensing any controlled drugs.  This note was generated in part with voice recognition software and I apologize for any typographical errors that were not detected and corrected.  DOROTHA Gustavo Level, PA-C, CAQ-OS [facility]

## 2023-10-24 NOTE — Progress Notes (Signed)
 Large Joint Injection: R knee  Date/Time: 10/24/2023 10:00 AM  Performed by: Kip Lynwood Double, PA Authorized by: Kip Lynwood Double, [place]   Location:  Knee Site:  R knee Topical skin anesthesia: obtained using ethyl chloride spray   Medications:  3 mL lidocaine  1 %; 30 mg sodium hyaluronate 30 mg/2 mL

## 2023-10-29 ENCOUNTER — Ambulatory Visit
Admission: RE | Admit: 2023-10-29 | Discharge: 2023-10-29 | Disposition: A | Discharge status: Home / Self Care | Source: Ambulatory Visit | Attending: Nephrology | Admitting: Nephrology

## 2023-10-29 DIAGNOSIS — R319 Hematuria, unspecified: Secondary | ICD-10-CM | POA: Diagnosis present

## 2023-10-30 NOTE — Progress Notes (Signed)
 Large Joint Injection: R knee  Date/Time: 10/30/2023 3:00 PM  Performed by: Kip Lynwood Double, PA Authorized by: Kip Lynwood Double, PA   Needle Size:  22 G Location:  Knee Site:  R knee Medications:  3 mL lidocaine  1 %; 30 mg sodium hyaluronate 30 mg/2 mL

## 2023-10-30 NOTE — Progress Notes (Signed)
 Chief Complaint: Chief Complaint  Patient presents with  . Right Knee - Follow-up    ORTHOVISC #3   Lori Wagner is a 73 y.o. female who presents today for her third and final right knee Orthovisc injection.  The patient denies any side effects from the previous injections performed in the clinic.  The patient denies any skin irritation from the previous injections.  She does feel that the pain in her right knee has been getting better after undergoing the previous gel injections.  She has not suffered any falls or trauma affecting the right knee since her last evaluation.  Past Medical History: Past Medical History:  Diagnosis Date  . Allergic state 1995   Hay fever mostly  . Anxiety 2003  . Arrhythmia 1996   SVT controlled with beta-blocker  . Arthritis    Knees, hands  . Cataract cortical, senile   . Constipation   . Coronary artery disease   . Degenerative disc disease, lumbar   . Depression   . Encounter for blood transfusion 2001   Internal bleeding following cath  . GERD (gastroesophageal reflux disease) 2000   No longer a problem  . Heart disease   . History of blood in urine   . History of kidney stones   . History of motion sickness   . History of prolapse of bladder   . Hyperlipidemia 1995   Under control with meds  . Hypertension   . Migraine   . PONV (postoperative nausea and vomiting)   . Sleep apnea    Under control with weight loss  . Thyroid  disease     Past Surgical History: Past Surgical History:  Procedure Laterality Date  . BUNION CORRECTION  1998  . APPENDECTOMY  2000  . CORONARY ARTERY BYPASS GRAFT  2001  . CORONARY ANGIOPLASTY  2001  . Release right trigger thumb  Right 01/17/2016   Dr.Poggi   . FUSION GREAT TOE Left 11/03/2018   Procedure: ARTHRODESIS, GREAT TOE; METATARSOPHALANGEAL JOINT;  Surgeon: Reda Mardell Blas, MD;  Location: [facility] OR;  Service: Orthopedics;  Laterality: Left;  . OSTECTOMY METATARSAL HEAD CLAYTON PROCEDURE  Left 11/03/2018   Procedure: OSTECTOMY, COMPLETE EXCISION; ALL METATARSAL HEADS, WITH PARTIAL PROXIMAL PHALANGECTOMY, EXCLUDING FIRST METATARSAL (EG, CLAYTON TYPE PROCEDURE);  Surgeon: Reda Mardell Blas, MD;  Location: [facility] OR;  Service: Orthopedics;  Laterality: Left;  . INTRAOPERATIVE FLUOROSCOPY Left 11/03/2018   Procedure: FLUOROSCOPY;  Surgeon: Reda Mardell Blas, MD;  Location: [facility] OR;  Service: Orthopedics;  Laterality: Left;  . INJECTION ANESTHETIC AGENT OTHER NERVE OR BRANCH Left 11/03/2018   Procedure: INJECTION(S), ANESTHETIC AGENT(S) AND/OR STEROID; OTHER PERIPHERAL NERVE OR BRANCH;  Surgeon: Reda Mardell Blas, MD;  Location: [facility] OR;  Service: Orthopedics;  Laterality: Left;  . cardiac cath  with stent placement  2001, 2005  . COLONOSCOPY  01/08/2017 [facility])  . COLONOSCOPY  03/05/2017 [facility])  . KNEE ARTHROSCOPY    . LAPAROSCOPIC GASTRIC BYPASS      Past Family History: Family History  Problem Relation Age of Onset  . Arthritis Mother   . Dementia Mother   . Heart disease Mother   . Colon polyps Mother   . Coronary Artery Disease (Blocked arteries around heart) Mother   . Hip fracture Mother   . High blood pressure (Hypertension) Mother   . Osteoarthritis Mother   . Osteoporosis (Thinning of bones) Mother   . Alzheimer's disease Mother        Vascular  . Hyperlipidemia (Elevated cholesterol)  Mother   . Obesity Mother   . Stroke Mother        Maybe  . Heart disease Father   . Coronary Artery Disease (Blocked arteries around heart) Father   . Stroke Father        Cerebral Hemorrhage  . Anxiety Brother   . Bipolar disorder Brother   . Depression Brother   . Myocardial Infarction (Heart attack) Brother   . Coronary Artery Disease (Blocked arteries around heart) Brother   . Obesity Brother   . Anxiety Daughter   . Depression Daughter   . Coronary Artery Disease (Blocked arteries around heart) Brother   . Heart disease Brother   . Myocardial Infarction (Heart  attack) Brother 43  . Coronary Artery Disease (Blocked arteries around heart) Maternal Aunt   . Osteoporosis (Thinning of bones) Maternal Aunt   . Osteoarthritis Maternal Aunt   . Coronary Artery Disease (Blocked arteries around heart) Maternal Grandmother   . Coronary Artery Disease (Blocked arteries around heart) Maternal Grandfather     Medications: Current Outpatient Medications  Medication Sig Dispense Refill  . aspirin  81 MG EC tablet Take 81 mg by mouth once daily    . atorvastatin  (LIPITOR) 80 MG tablet Take 80 mg by mouth nightly       . bisoprolol  (ZEBETA ) 5 MG tablet Take 2.5 mg by mouth once daily       . buPROPion  (WELLBUTRIN  SR) 100 MG SR tablet Take 1 tablet (100 mg total) by mouth once daily 90 each 3  . buPROPion  (WELLBUTRIN  SR) 200 MG SR tablet Take 1 tablet (200 mg total) by mouth once daily 90 each 3  . busPIRone (BUSPAR) 15 MG tablet Take 1 tablet (15 mg total) by mouth 2 (two) times daily 180 tablet 3  . cholecalciferol  1000 unit tablet Take by mouth    . DULoxetine  (CYMBALTA ) 60 MG DR capsule TAKE 1 CAPSULE DAILY 90 capsule 3  . empagliflozin (JARDIANCE) 10 mg tablet Take 1 tablet by mouth once daily    . ezetimibe (ZETIA) 10 mg tablet Take 1 tablet by mouth once daily    . gabapentin  (NEURONTIN ) 100 MG capsule 1 po qHS 30 capsule 1  . isosorbide  mononitrate (IMDUR ) 60 MG ER tablet TAKE 1 TABLET BY MOUTH  DAILY    . levothyroxine (SYNTHROID) 100 MCG tablet TAKE 1 TABLET ONCE DAILY (TAKE ON AN EMPTY STOMACH, WITH A GLASS OF WATER AT LEAST 30 TO 60 MINUTES BEFORE BREAKFAST) 90 tablet 3  . nitroGLYcerin  (NITROSTAT ) 0.4 MG SL tablet Place 0.4 mg under the tongue every 5 (five) minutes as needed for Chest pain May take up to 3 doses.    . polyethylene glycol (MIRALAX ) powder Take by mouth    . traMADoL (ULTRAM) 50 mg tablet Take 1 tablet (50 mg total) by mouth 2 (two) times daily as needed 1 po bid prn 60 tablet 0  . traZODone  (DESYREL ) 50 MG tablet TAKE 2 TABLETS AT  BEDTIME 180 tablet 3   No current facility-administered medications for this visit.    Allergies: Allergies  Allergen Reactions  . Ace Inhibitors Other (See Comments)    Cough and BP drops   . Epinephrine  Other (See Comments)    SVT  . Sulfa (Sulfonamide Antibiotics) Other (See Comments) and Hives    Other Reaction: Other reaction  . Morphine  Nausea And Vomiting  . Opioids - Morphine  Analogues Nausea And Vomiting  . Sulfasalazine Hives  . Artichoke Hives  . Penicillins  Hives     Review of Systems:  A comprehensive 14 point ROS was performed, reviewed by me today, and the pertinent orthopaedic findings are documented in the HPI.   Exam: Ht 167.6 cm (5' 6)   Wt 75.8 kg (167 lb)   BMI 26.95 kg/m  General/Constitutional: The patient appears to be well-nourished, well-developed, and in no acute distress. Neuro/Psych: Normal mood and affect, oriented to person, place and time.  Orthovisc Injection Procedure:  The risks, benefits and alternatives of the proposed injection were discussed with patient.  The patient states that she understands these and she verbally consented to proceed.  The patient's right  Anterolateral region was cleaned with alcohol solution and then ethylchloride anesthetic spray was utilized. The patient was injected with 3 cc's of 1% lidocaine  for anesthetic effect, using a 25 gauge needle.  Using aseptic technique, an arthrocentesis procedure was performed with an injection of 2 cc's Orthovisc, using a 23 gauge needle.   Patient tolerated this well without any complications.  She was counseled as to expected post injection course, including the possibility of temporary worsening of symptoms.  She was instructed as to concerning symptoms or signs, and was  instructed to contact our office if these should appear.  Imaging: None.  Impression: Primary osteoarthritis of right knee [M17.11] Primary osteoarthritis of right knee  (primary encounter  diagnosis)  Plan:  1.  Treatment options were discussed today with the patient. 2.  The patient tolerated the above injection without any complications. 3.  She was instructed to decrease her activities over the next 24-48 hours.  Gradually increase activities after this timeframe. 4.  The patient will contact the clinic in 3-4 weeks with an update on her right knee symptoms.  They can call the clinic they have any questions, new symptoms develop or symptoms worsen.  This office visit took 30 minutes, of which >50% involved patient counseling/education.  Review of the [place] CSRS was performed in accordance of the [employer] prior to dispensing any controlled drugs.  This note was generated in part with voice recognition software and I apologize for any typographical errors that were not detected and corrected.  DOROTHA Gustavo Level, PA-C, CAQ-OS [facility]

## 2023-11-12 NOTE — Anesthesia Postprocedure Evaluation (Signed)
 Patient: Lori Wagner  Procedure Summary     Date: 11/12/23 Room / Location: [facility] OR 01 / [facility] OR   Anesthesia Start: 1054 Anesthesia Stop: 1122   Procedure: RIGHT RELEASE CARPAL TUNNEL, INJECTION RIGHT CMC ARTHRITIS,LEFT  INJECTION INDEX FINGER/MIDDLE FINGER (Right: Wrist) Diagnosis:      Carpal tunnel syndrome on right     Primary osteoarthritis of first carpometacarpal joint of right hand     Trigger finger, left index finger     Trigger finger, left middle finger     (Carpal tunnel syndrome on right [G56.01])     (Primary osteoarthritis of first carpometacarpal joint of right hand [M18.11])     (Trigger finger, left index finger [M65.322])     (Trigger finger, left middle finger [F34.667])   Surgeons: Ozell JULIANNA Hock, MD Responsible Provider: Carlin Toribio Carson, MD   Anesthesia Type: general ASA Status: 3       Anesthesia Type: general  Vitals Value Taken Time  BP 119/52 11/12/23 11:30  Temp 97.6 F (36.4 C) 11/12/23 11:22  Pulse 65 11/12/23 11:31  Resp 14 11/12/23 11:30  SpO2 93 % 11/12/23 11:31  Vitals shown include unfiled device data.  No notable events documented.  Anesthesia Post Evaluation  Final anesthesia type: MAC Patient location during evaluation: PACU Patient participation: complete - patient participated Level of consciousness: awake and alert Pain score: 0 Pain management: adequate Post-op nausea and vomiting?: none Patient temperature: Normothermic Cardiovascular status: acceptable and hemodynamically stable Respiratory status: acceptable Hydration status: acceptable Post-op disposition: Home Anesthesia post-op complications?:no complications

## 2023-11-12 NOTE — H&P (Signed)
 Plastic and Reconstructive Surgery Clinic Note  Chief Complaint: No chief complaint on file.    Referred by: No ref. provider found  History of Present Illness:  Subjective  Lori Wagner is a 73 y.o. female who presents today for scheduled procedure.  The patient denies any changes to medical history, surgical history, or allergies. They deny any recent fever or sick contacts.       Past Medical History:  Past Medical History:  Diagnosis Date  . Anxiety   . CAD (coronary artery disease)   . Depression   . Disease of thyroid  gland   . Hypertension   . OSA (obstructive sleep apnea)     Problem List Patient Active Problem List  Diagnosis  . Bilateral carpal tunnel syndrome  . Primary osteoarthritis of first carpometacarpal joint of right hand  . Trigger index finger of left hand  . Trigger middle finger of left hand    Past Surgical History: Past Surgical History:  Procedure Laterality Date  . CORONARY ARTERY BYPASS GRAFT  2001  . GASTRIC BYPASS     Care everywhere    Social History: Social History   Tobacco Use  . Smoking status: Never    Passive exposure: Never  . Smokeless tobacco: Never  Substance Use Topics  . Alcohol use: Never    Family History: family history is not on file.  Allergies: Allergies  Allergen Reactions  . Ace Inhibitors Cough and Other (See Comments)    Cough and BP drops   Cough and BP drops  . Artichoke Hives  . Epinephrine  Other (See Comments)    Other reaction(s): Other (See Comments)  Other Reaction: Other reaction-may go into SVT  Pt states she wants this taken out.  SVT  Other Reaction: Other reaction-may go into SVT  Pt states she wants this taken out.  SVT  . Penicillins Hives  . Sulfa (Sulfonamide Antibiotics) Hives and Other (See Comments)    Other Reaction: Other reaction  . Sulfasalazine Hives  . Opioids - Morphine  Analogues Nausea And Vomiting    Other reaction(s): Nausea And Vomiting     Medications: has a current medication list which includes the following prescription(s): acetaminophen , aspirin , atorvastatin , azelastine, bisoprolol , bupropion , buspirone, cholecalciferol , diphenhydramine-acetaminophen , duloxetine , empagliflozin, ezetimibe, gabapentin , isosorbide  mononitrate, levothyroxine, nitroglycerin , polyethylene glycol, tramadol, and trazodone .  Review of Systems: A 14 point review of systems was obtained and negative unless otherwise stated in the HPI  Physical Examination:  height is 1.651 m (5' 5) and weight is 73.9 kg (163 lb). Her skin temperature is 98.3 F (36.8 C). Her blood pressure is 131/83 and her pulse is 58. Her respiration is 16 and oxygen saturation is 96%.  Body mass index is 27.12 kg/m.  Gen: well appearing female in no acute distress Psych: alert, normal mood/affect Neuro: follows commands, CN VII function symmetric Eyes: pupils equal, round ENT:  mucous membranes moist and pink Resp: breathing comfortably on room air, non-labored, no distress CV: no clubbing, no cyanosis Skin: warm, no systemic rashes Extremity: Visible thenar wasting, tinels/durkens positive bilaterally TTP overlying right first CMC TTP overlying left IF/MF A1 pulleys ROM: full Sensation: LT sensation present M/R/U Vascular: Brisk capillary refill    Assessment and Plan:  Problem List Items Addressed This Visit   None  72yo F with bilateral CTS, right CMC arthritis, left IF/MF trigger -Patient consented for scheduled procedure today, risks/benefits reviewed for the procedure and all questions answered -Patient is appropriate for surgery in the ambulatory setting  Lori JULIANNA Hock, MD  Plastic & Reconstructive Surgery Hand & Upper Extremity Surgery [facility] 11/12/2023 10:26 AM

## 2023-12-04 ENCOUNTER — Ambulatory Visit: Attending: Plastic Surgery | Admitting: Occupational Therapy

## 2023-12-04 DIAGNOSIS — M6281 Muscle weakness (generalized): Secondary | ICD-10-CM | POA: Insufficient documentation

## 2023-12-04 DIAGNOSIS — R278 Other lack of coordination: Secondary | ICD-10-CM | POA: Insufficient documentation

## 2023-12-04 NOTE — Therapy (Incomplete)
 OUTPATIENT OCCUPATIONAL THERAPY NEURO EVALUATION  Patient Name: Lori Wagner MRN: [id number] DOB:1950/12/19, 73 y.o., female Today's Date: 12/04/2023  PCP: Valora Agent, MD REFERRING PROVIDER: Sheryle Sharper, MD  END OF SESSION:  OT End of Session - 12/04/23 2333     Visit Number 1    Number of Visits 24    Date for OT Re-Evaluation 02/26/24    OT Start Time 1445    OT Stop Time 1530    OT Time Calculation (min) 45 min    Activity Tolerance Patient tolerated treatment well    Behavior During Therapy [facility] for tasks assessed/performed          Past Medical History:  Diagnosis Date  . Actinic keratosis   . Anxiety   . Arthritis    knees,hip , back and hands  . Chronic kidney disease    Hx of kidney stones, years ago  . Coronary artery disease   . Dysrhythmia    SVT- controlled with Beta blocker  . Family history of bone cancer   . Family history of lung cancer   . Family history of ovarian cancer   . Fatigue   . HOH (hard of hearing)    tinnitis both ears  . Hypertension     can cause lightheadedness  . Motion sickness   . Neuromuscular disorder (HCC)    carpal tunnel both hands  . PONV (postoperative nausea and vomiting)   . Post herpetic neuralgia    left side of face  . RA (rheumatoid arthritis) (HCC)   . Shingles 02/2017   left side of face  . Sleep apnea    Hx of/ when overweight, no CPAP  . SVT (supraventricular tachycardia) (HCC)   . Thyroid  disease    HX OF/ NO LONGER TAKING THYROID  MEDS, THYROID  TESTS CAME BACK NORMAL  . Vertigo    Hx of   Past Surgical History:  Procedure Laterality Date  . ANKLE SURGERY Left   . APPENDECTOMY    . BLADDER REPAIR    . CARDIAC CATHETERIZATION    . CARDIAC SURGERY    . CATARACT EXTRACTION W/PHACO Left 06/03/2017   Procedure: CATARACT EXTRACTION PHACO AND INTRAOCULAR LENS PLACEMENT (IOC)  LEFT;  Surgeon: Myrna Adine Anes, MD;  Location: [facility];  Service: Ophthalmology;  Laterality:  Left;  . CATARACT EXTRACTION W/PHACO Right 07/08/2017   Procedure: CATARACT EXTRACTION PHACO AND INTRAOCULAR LENS PLACEMENT (IOC) RIGHT;  Surgeon: Myrna Adine Anes, MD;  Location: [facility];  Service: Ophthalmology;  Laterality: Right;  . COLONOSCOPY N/A 01/08/2017   Procedure: COLONOSCOPY;  Surgeon: Unk Corinn Skiff, MD;  Location: [facility];  Service: Gastroenterology;  Laterality: N/A;  . CORONARY ARTERY BYPASS GRAFT  2001   x4  . GASTRIC BYPASS     SLEEVE  . HAND SURGERY Right    2 TRIGGER FINGERS  . KNEE ARTHROSCOPY Right   . renal colic    . TRIGGER FINGER RELEASE Right 01/17/2016   Procedure: RELEASE TRIGGER FINGER/A-1 PULLEY THUMB;  Surgeon: Norleen JINNY Maltos, MD;  Location: [facility];  Service: Orthopedics;  Laterality: Right;   Patient Active Problem List   Diagnosis Date Noted  . Monoclonal gammopathy 02/24/2023  . Chest pain 11/24/2019  . Genetic testing 02/02/2019  . Family history of ovarian cancer   . Family history of bone cancer   . Family history of lung cancer   . CAD (coronary artery disease) 07/03/2017  . Hypotension 07/03/2017  . Proctitis 01/07/2017  .  Constipation   . LLQ abdominal pain   . Abnormal CT of the abdomen   . Tremor 08/07/2016  . SVT (supraventricular tachycardia) (HCC) 06/25/2016  . Trigger finger of right thumb 01/18/2016  . Carpal tunnel syndrome 10/13/2015  . Bilateral carpal tunnel syndrome 10/13/2015  . Arthralgia of multiple joints 07/03/2015  . CFIDS (chronic fatigue and immune dysfunction syndrome) (HCC) 07/03/2015  . Degeneration of intervertebral disc of lumbar region 07/03/2015  . Hand paresthesia 07/03/2015  . Major depressive disorder, recurrent, severe without psychotic features (HCC) 12/21/2014  . Panic disorder 12/21/2014  . Impingement syndrome of shoulder 07/22/2014  . Infraspinatus tenosynovitis 07/22/2014  . Impingement syndrome of left shoulder 07/22/2014  . Other synovitis and tenosynovitis,  left shoulder 07/22/2014  . Left supraspinatus tenosynovitis 07/22/2014  . Acquired hypothyroidism 07/12/2014  . Pain in shoulder 07/12/2014  . Clinical depression 07/12/2014  . Hyperlipidemia 07/12/2014    ONSET DATE: 11/12/2023  REFERRING DIAG: Right Carpal Tunnel Release  THERAPY DIAG:  Muscle weakness (generalized)  Other lack of coordination  Rationale for Evaluation and Treatment: Rehabilitation  SUBJECTIVE:   SUBJECTIVE STATEMENT: Pt. reports that she is in the process of moving.  Pt accompanied by: self  PERTINENT HISTORY:  Pt. is a 10 y. o. who had Carpal Tunnel Surgery on 11/12/2023. Pt. 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 Pt. has had recent injections of the Right 1st digit CMC arthritis, and injections to the left 2nd, and 3rd digit trigger fingers. PMHx includes: CAD with CABG in 2001, Gastric Bypass surgery, Anxiety, Depression, Disease of the thyroid  gland, HTN, and OSA.  PRECAUTIONS: None  WEIGHT BEARING RESTRICTIONS: No  PAIN:  Are you having pain? 2/10-with 8/10 shooting pain  FALLS: Has patient fallen in last 6 months? No  LIVING ENVIRONMENT: Lives with: lives with their spouse Lives in: House/apartment Stairs: one story Has following equipment: Has access to wrist braces in needed  PLOF:  Difficulty typing prior to the surgery  PATIENT GOALS:  To relief pain, and increase function  OBJECTIVE:  Note: Objective measures were completed at Evaluation unless otherwise noted.  HAND DOMINANCE: Right  ADLs:  Transfers/ambulation related to ADLs: Eating: Independent, self-feeding, difficulty cutting food, opening bottles, packets, containers Grooming: Painful when brushing teeth with the right hand UB Dressing: Pt. Reports that she is improving with fastening a  bra LB Dressing:  zipping pants, buttoning pants are difficult to perform Toileting: Difficulty with toileting hygiene care Bathing:  Independent-Takes tub baths Tub Shower transfers: Difficulty at times getting in and out of the tub for tub baths.  IADLs: Shopping: Carrying heavy it Light housekeeping: Pt. Husband performs housekeeping tasks Meal Prep: Husband typically performs meal prep tasks.  Community mobility:  Independent. Medication management:  Difficulty opening bottles Financial management: No change Handwriting: 50% legibility for name   Hobbies: Woodworking/carving/building furniture   MOBILITY STATUS: Independent  FUNCTIONAL OUTCOME MEASURES: MAM-20: 64/80  UPPER EXTREMITY ROM:    Active ROM Right Eval  Left eval  Shoulder flexion [facility]  Shoulder abduction [facility]  Shoulder adduction    Shoulder extension    Shoulder internal rotation    Shoulder external rotation    Elbow flexion [facility] WFL  Elbow extension [facility] WFL  Wrist flexion 74(74) 86(86)  Wrist extension 64(68) 64(66)  Wrist ulnar deviation 32(34) 38(38)  Wrist radial deviation 24(26) 24(26)  Wrist pronation    Wrist supination    (Blank rows =  not tested)  UPPER EXTREMITY MMT:     MMT Right Eval  Left Eval   Shoulder flexion 5/5 5/5  Shoulder abduction 5/5 5/5  Shoulder adduction    Shoulder extension    Shoulder internal rotation    Shoulder external rotation    Middle trapezius    Lower trapezius    Elbow flexion 5/5 5/5  Elbow extension 5/5 5/5  Wrist flexion 4/5 5/5  Wrist extension 4/5 5/5  Wrist ulnar deviation    Wrist radial deviation    Wrist pronation    Wrist supination    (Blank rows = not tested)  HAND FUNCTION: Grip strength: Right: 10 lbs; Left: 25 lbs, Lateral pinch: Right: 8 lbs, Left: 10 lbs, and 3 point pinch: Right: 4 lbs, Left: 7 lbs  COORDINATION: 9 Hole Peg test: Right: 27 sec; Left: 21 sec  SENSATION: Light touch: WFL  EDEMA: none  MUSCLE TONE:  Intact  COGNITION: Overall cognitive status: Within functional limits for tasks assessed   TREATMENT DATE: 12/04/23   OT  initial evaluation was completed, and Pt. education was provided as indicated below.   PATIENT EDUCATION: Education details: OT services, POC, goals and ADL/IADL functional Status.  Person educated: Patient Education method: Explanation, Demonstration, and Tactile cues Education comprehension: verbalized understanding, returned demonstration, verbal cues required, tactile cues required, and needs further education  HOME EXERCISE PROGRAM: .  Continue to assess need for, and provide as indicated  GOALS: Goals reviewed with patient? No  SHORT TERM GOALS: Target date: 01/15/2024   Pt. will be independent with HEPs for RUE strength Baseline: Eval: No current HEP Goal status: INITIAL  LONG TERM GOALS: Target date: 02/26/2024  To decrease pain by 2 points on the pain scale to be able to engage in ADLs/IADLs Baseline: Eval: 2/10 with 8/10 intermittent shooting pain in the right wrist/hand. Goal status: INITIAL  2.  Pt. Will  improve right grip strength by 5# to be able to open jars, and containers  more efficiently.  Baseline: Eval: Grip strength: Right: 10#, Left: 25# Goal status: INITIAL  3.  Pt. Will improve right lateral pinch strength by 3# to be able to hold and use a toothbrush efficiently. Baseline: Eval: Lateral pinch strength: Right: 8#, Left 10 # Goal status: INITIAL  4.  Pt. Will improve right 3pt. pinch strength to be able to to be able to cut food. Baseline: Eval: 3Pt. Pinch strength: Right: 4#, L: 7# Goal status: INITIAL  5.  Pt. Will improve right hand [facility] skills by 2 sec. Of speed to be able to grasp and manage pant buttons, and zippers. Baseline: Eval: FMC skills: Right: 27 sec., Left: 21 sec.  Goal status: INITIAL  6.  Pt. Will improve MAM-20 score by 3 points to reflect improved ADL/IADL performance Baseline: Eval: MAM-20 sum score: 64/80 Goal status: INITIAL  ASSESSMENT:  CLINICAL IMPRESSION: Patient is a 73  y.o. female who was seen today for occupational  therapy evaluation for Right Carpal Tunnel Release. Pt. Presents with 2/10 pain with intermittent 8/10 shooting pain in the hand. Pt. presents with decreased grip strength in the right hand, decreased pinch strength, and impaired [facility] skills which limit her ability to efficiently perform daily tasks including brushing teeth without pain, zipping and buttoning pants, cutting food, securely holding objects, and opening jars or containers. MAM-20 sum score: 64/80. Pt. will benefit from OT services to work on improving right hand pain, increasing right grip, and pinch strength, and improve [facility] skills  in order to be able to perform daily ADL, and IADL tasks more efficiently.  PERFORMANCE DEFICITS: in functional skills including {OT physical skills:25468}, cognitive skills including {OT cognitive skills:25469}, and psychosocial skills including {OT psychosocial skills:25470}.   IMPAIRMENTS: are limiting patient from {OT performance deficits:25471}.   CO-MORBIDITIES: {Comorbidities:25485} that affects occupational performance. Patient will benefit from skilled OT to address above impairments and improve overall function.  MODIFICATION OR ASSISTANCE TO COMPLETE EVALUATION: {OT modification:25474}  OT OCCUPATIONAL PROFILE AND HISTORY: {OT PROFILE AND HISTORY:25484}  CLINICAL DECISION MAKING: {OT CDM:25475}  REHAB POTENTIAL: {rehabpotential:25112}  EVALUATION COMPLEXITY: {Evaluation complexity:25115}    PLAN:  OT FREQUENCY: {rehab frequency:25116}  OT DURATION: {rehab duration:25117}  PLANNED INTERVENTIONS: {OT Interventions:25467}  RECOMMENDED OTHER SERVICES: ***  CONSULTED AND AGREED WITH PLAN OF CARE: {ENR:74513}  PLAN FOR NEXT SESSION: PIERRETTE Richardson Otter, OT 12/04/2023, 11:36 PM

## 2023-12-04 NOTE — Therapy (Signed)
 OUTPATIENT OCCUPATIONAL THERAPY NEURO EVALUATION  Patient Name: Lori Wagner MRN: [id number] DOB:12/08/1950, 73 y.o., female Today's Date: 12/04/2023  PCP: Valora Agent, MD REFERRING PROVIDER: Sheryle Sharper, MD  END OF SESSION:  OT End of Session - 12/04/23 2333     Visit Number 1    Number of Visits 24    Date for OT Re-Evaluation 02/26/24    OT Start Time 1445    OT Stop Time 1530    OT Time Calculation (min) 45 min    Activity Tolerance Patient tolerated treatment well    Behavior During Therapy WFL for tasks assessed/performed          Past Medical History:  Diagnosis Date   Actinic keratosis    Anxiety    Arthritis    knees,hip , back and hands   Chronic kidney disease    Hx of kidney stones, years ago   Coronary artery disease    Dysrhythmia    SVT- controlled with Beta blocker   Family history of bone cancer    Family history of lung cancer    Family history of ovarian cancer    Fatigue    HOH (hard of hearing)    tinnitis both ears   Hypertension     can cause lightheadedness   Motion sickness    Neuromuscular disorder (HCC)    carpal tunnel both hands   PONV (postoperative nausea and vomiting)    Post herpetic neuralgia    left side of face   RA (rheumatoid arthritis) (HCC)    Shingles 02/2017   left side of face   Sleep apnea    Hx of/ when overweight, no CPAP   SVT (supraventricular tachycardia) (HCC)    Thyroid  disease    HX OF/ NO LONGER TAKING THYROID  MEDS, THYROID  TESTS CAME BACK NORMAL   Vertigo    Hx of   Past Surgical History:  Procedure Laterality Date   ANKLE SURGERY Left    APPENDECTOMY     BLADDER REPAIR     CARDIAC CATHETERIZATION     CARDIAC SURGERY     CATARACT EXTRACTION W/PHACO Left 06/03/2017   Procedure: CATARACT EXTRACTION PHACO AND INTRAOCULAR LENS PLACEMENT (IOC)  LEFT;  Surgeon: Myrna Adine Anes, MD;  Location: [facility];  Service: Ophthalmology;  Laterality: Left;   CATARACT EXTRACTION  W/PHACO Right 07/08/2017   Procedure: CATARACT EXTRACTION PHACO AND INTRAOCULAR LENS PLACEMENT (IOC) RIGHT;  Surgeon: Myrna Adine Anes, MD;  Location: [facility];  Service: Ophthalmology;  Laterality: Right;   COLONOSCOPY N/A 01/08/2017   Procedure: COLONOSCOPY;  Surgeon: Unk Corinn Skiff, MD;  Location: [facility];  Service: Gastroenterology;  Laterality: N/A;   CORONARY ARTERY BYPASS GRAFT  2001   x4   GASTRIC BYPASS     SLEEVE   HAND SURGERY Right    2 TRIGGER FINGERS   KNEE ARTHROSCOPY Right    renal colic     TRIGGER FINGER RELEASE Right 01/17/2016   Procedure: RELEASE TRIGGER FINGER/A-1 PULLEY THUMB;  Surgeon: Norleen JINNY Maltos, MD;  Location: [facility];  Service: Orthopedics;  Laterality: Right;   Patient Active Problem List   Diagnosis Date Noted   Monoclonal gammopathy 02/24/2023   Chest pain 11/24/2019   Genetic testing 02/02/2019   Family history of ovarian cancer    Family history of bone cancer    Family history of lung cancer    CAD (coronary artery disease) 07/03/2017   Hypotension 07/03/2017   Proctitis 01/07/2017  Constipation    LLQ abdominal pain    Abnormal CT of the abdomen    Tremor 08/07/2016   SVT (supraventricular tachycardia) (HCC) 06/25/2016   Trigger finger of right thumb 01/18/2016   Carpal tunnel syndrome 10/13/2015   Bilateral carpal tunnel syndrome 10/13/2015   Arthralgia of multiple joints 07/03/2015   CFIDS (chronic fatigue and immune dysfunction syndrome) (HCC) 07/03/2015   Degeneration of intervertebral disc of lumbar region 07/03/2015   Hand paresthesia 07/03/2015   Major depressive disorder, recurrent, severe without psychotic features (HCC) 12/21/2014   Panic disorder 12/21/2014   Impingement syndrome of shoulder 07/22/2014   Infraspinatus tenosynovitis 07/22/2014   Impingement syndrome of left shoulder 07/22/2014   Other synovitis and tenosynovitis, left shoulder 07/22/2014   Left supraspinatus tenosynovitis  07/22/2014   Acquired hypothyroidism 07/12/2014   Pain in shoulder 07/12/2014   Clinical depression 07/12/2014   Hyperlipidemia 07/12/2014    ONSET DATE: 11/12/2023  REFERRING DIAG: Right Carpal Tunnel Release  THERAPY DIAG:  Muscle weakness (generalized)  Other lack of coordination  Rationale for Evaluation and Treatment: Rehabilitation  SUBJECTIVE:   SUBJECTIVE STATEMENT: Pt. reports that she is in the process of moving.  Pt accompanied by: self  PERTINENT HISTORY:  Pt. is a 64 y. o. who had Carpal Tunnel Surgery on 11/12/2023. Pt. 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 Pt. has had recent injections of the Right 1st digit CMC arthritis, and injections to the left 2nd, and 3rd digit trigger fingers. PMHx includes: CAD with CABG in 2001, Gastric Bypass surgery, Anxiety, Depression, Disease of the thyroid  gland, HTN, and OSA.  PRECAUTIONS: None  WEIGHT BEARING RESTRICTIONS: No  PAIN:  Are you having pain? 2/10-with 8/10 shooting pain  FALLS: Has patient fallen in last 6 months? No  LIVING ENVIRONMENT: Lives with: lives with their spouse Lives in: House/apartment Stairs: one story Has following equipment: Has access to wrist braces in needed  PLOF:  Difficulty typing prior to the surgery  PATIENT GOALS:  To relief pain, and increase function  OBJECTIVE:  Note: Objective measures were completed at Evaluation unless otherwise noted.  HAND DOMINANCE: Right  ADLs:  Transfers/ambulation related to ADLs: Eating: Independent, self-feeding, difficulty cutting food, opening bottles, packets, containers Grooming: Painful when brushing teeth with the right hand UB Dressing: Pt. Reports that she is improving with fastening a  bra LB Dressing:  zipping pants, buttoning pants are difficult to perform Toileting: Difficulty with toileting hygiene care Bathing: Independent-Takes tub baths Tub Shower transfers: Difficulty at times  getting in and out of the tub for tub baths.  IADLs: Shopping: Carrying heavy it Light housekeeping: Pt. Husband performs housekeeping tasks Meal Prep: Husband typically performs meal prep tasks.  Community mobility:  Independent. Medication management:  Difficulty opening bottles Financial management: No change Handwriting: 50% legibility for name   Hobbies: Woodworking/carving/building furniture   MOBILITY STATUS: Independent  FUNCTIONAL OUTCOME MEASURES: MAM-20: 64/80  UPPER EXTREMITY ROM:    Active ROM Right Eval  Left eval  Shoulder flexion [facility]  Shoulder abduction [facility]  Shoulder adduction    Shoulder extension    Shoulder internal rotation    Shoulder external rotation    Elbow flexion [facility] WFL  Elbow extension [facility] WFL  Wrist flexion 74(74) 86(86)  Wrist extension 64(68) 64(66)  Wrist ulnar deviation 32(34) 38(38)  Wrist radial deviation 24(26) 24(26)  Wrist pronation    Wrist supination    (Blank rows =  not tested)  UPPER EXTREMITY MMT:     MMT Right Eval  Left Eval   Shoulder flexion 5/5 5/5  Shoulder abduction 5/5 5/5  Shoulder adduction    Shoulder extension    Shoulder internal rotation    Shoulder external rotation    Middle trapezius    Lower trapezius    Elbow flexion 5/5 5/5  Elbow extension 5/5 5/5  Wrist flexion 4/5 5/5  Wrist extension 4/5 5/5  Wrist ulnar deviation    Wrist radial deviation    Wrist pronation    Wrist supination    (Blank rows = not tested)  HAND FUNCTION: Grip strength: Right: 10 lbs; Left: 25 lbs, Lateral pinch: Right: 8 lbs, Left: 10 lbs, and 3 point pinch: Right: 4 lbs, Left: 7 lbs  COORDINATION: 9 Hole Peg test: Right: 27 sec; Left: 21 sec  SENSATION: Light touch: WFL  EDEMA: none  MUSCLE TONE:  Intact  COGNITION: Overall cognitive status: Within functional limits for tasks assessed   TREATMENT DATE: 12/04/23   OT initial evaluation was completed, and Pt. education was provided as  indicated below.   PATIENT EDUCATION: Education details: OT services, POC, goals and ADL/IADL functional Status.  Person educated: Patient Education method: Explanation, Demonstration, and Tactile cues Education comprehension: verbalized understanding, returned demonstration, verbal cues required, tactile cues required, and needs further education  HOME EXERCISE PROGRAM: .  Continue to assess need for, and provide as indicated  GOALS: Goals reviewed with patient? No  SHORT TERM GOALS: Target date: 01/15/2024   Pt. will be independent with HEPs for RUE strength Baseline: Eval: No current HEP Goal status: INITIAL  LONG TERM GOALS: Target date: 02/26/2024  To decrease pain by 2 points on the pain scale to be able to engage in ADLs/IADLs Baseline: Eval: 2/10 with 8/10 intermittent shooting pain in the right wrist/hand. Goal status: INITIAL  2.  Pt. Will  improve right grip strength by 5# to be able to open jars, and containers  more efficiently.  Baseline: Eval: Grip strength: Right: 10#, Left: 25# Goal status: INITIAL  3.  Pt. Will improve right lateral pinch strength by 3# to be able to hold and use a toothbrush efficiently. Baseline: Eval: Lateral pinch strength: Right: 8#, Left 10 # Goal status: INITIAL  4.  Pt. Will improve right 3pt. pinch strength to be able to to be able to cut food. Baseline: Eval: 3Pt. Pinch strength: Right: 4#, L: 7# Goal status: INITIAL  5.  Pt. Will improve right hand [facility] skills by 2 sec. Of speed to be able to grasp and manage pant buttons, and zippers. Baseline: Eval: FMC skills: Right: 27 sec., Left: 21 sec.  Goal status: INITIAL  6.  Pt. Will improve MAM-20 score by 3 points to reflect improved ADL/IADL performance Baseline: Eval: MAM-20 sum score: 64/80 Goal status: INITIAL  ASSESSMENT:  CLINICAL IMPRESSION:  Patient is a 73  y.o. female who was seen today for occupational therapy evaluation for Right Carpal Tunnel Release. Pt. Presents  with 2/10 pain with intermittent 8/10 shooting pain in the right hand. Pt. presents with decreased grip strength in the right hand, decreased pinch strength, and impaired [facility] skills which limit her ability to efficiently perform daily tasks including brushing teeth without pain, zipping and buttoning pants, cutting food, securely holding objects, and opening jars or containers. MAM-20 sum score: 64/80. Pt. will benefit from OT services to work on improving right hand pain, increasing right grip, and pinch strength, and improve  [facility] skills in order to be able to perform daily ADL, and IADL tasks more efficiently.  PERFORMANCE DEFICITS: in functional skills including ADLs, IADLs, coordination, dexterity, ROM, strength, pain, Fine motor control, Gross motor control, and UE functional use, cognitive skills including , and psychosocial skills including environmental adaptation and routines and behaviors.   IMPAIRMENTS: are limiting patient from ADLs, IADLs, and leisure.   CO-MORBIDITIES: may have co-morbidities  that affects occupational performance. Patient will benefit from skilled OT to address above impairments and improve overall function.  MODIFICATION OR ASSISTANCE TO COMPLETE EVALUATION: Min-Moderate modification of tasks or assist with assess necessary to complete an evaluation.  OT OCCUPATIONAL PROFILE AND HISTORY: Detailed assessment: Review of records and additional review of physical, cognitive, psychosocial history related to current functional performance.  CLINICAL DECISION MAKING: Moderate - several treatment options, min-mod task modification necessary  REHAB POTENTIAL: Good  EVALUATION COMPLEXITY: Moderate    PLAN:  OT FREQUENCY: 2x/week  OT DURATION: 12 weeks  PLANNED INTERVENTIONS: 97535 self care/ADL training, 02889 therapeutic exercise, 97530 therapeutic activity, 97112 neuromuscular re-education, 97140 manual therapy, 97035 ultrasound, 97018 paraffin, 02989 moist heat,  97010 cryotherapy, 97034 contrast bath, 97033 iontophoresis, 97760 Orthotic Initial, 97763 Orthotic/Prosthetic subsequent, scar mobilization, patient/family education, and DME and/or AE instructions  RECOMMENDED OTHER SERVICES: None  CONSULTED AND AGREED WITH PLAN OF CARE: Patient  PLAN FOR NEXT SESSION: Treatment   Richardson Otter, MS, OTR/L   12/04/2023, 11:36 PM

## 2023-12-08 ENCOUNTER — Encounter: Admitting: Occupational Therapy

## 2023-12-09 ENCOUNTER — Ambulatory Visit: Admitting: Occupational Therapy

## 2023-12-09 DIAGNOSIS — M6281 Muscle weakness (generalized): Secondary | ICD-10-CM | POA: Diagnosis not present

## 2023-12-09 DIAGNOSIS — R278 Other lack of coordination: Secondary | ICD-10-CM

## 2023-12-09 NOTE — Therapy (Addendum)
 OUTPATIENT OCCUPATIONAL THERAPY TREATMENT  Patient Name: Lori Wagner MRN: [id number] DOB:1950-06-04, 73 y.o., female Today's Date: 12/09/2023  PCP: Valora Agent, MD REFERRING PROVIDER: Sheryle Sharper, MD  END OF SESSION:  OT End of Session - 12/09/23 1419     Visit Number 2    Number of Visits 24    Date for OT Re-Evaluation 02/26/24    OT Start Time 1320    OT Stop Time 1400    OT Time Calculation (min) 40 min    Activity Tolerance Patient tolerated treatment well    Behavior During Therapy WFL for tasks assessed/performed          Past Medical History:  Diagnosis Date   Actinic keratosis    Anxiety    Arthritis    knees,hip , back and hands   Chronic kidney disease    Hx of kidney stones, years ago   Coronary artery disease    Dysrhythmia    SVT- controlled with Beta blocker   Family history of bone cancer    Family history of lung cancer    Family history of ovarian cancer    Fatigue    HOH (hard of hearing)    tinnitis both ears   Hypertension     can cause lightheadedness   Motion sickness    Neuromuscular disorder (HCC)    carpal tunnel both hands   PONV (postoperative nausea and vomiting)    Post herpetic neuralgia    left side of face   RA (rheumatoid arthritis) (HCC)    Shingles 02/2017   left side of face   Sleep apnea    Hx of/ when overweight, no CPAP   SVT (supraventricular tachycardia) (HCC)    Thyroid  disease    HX OF/ NO LONGER TAKING THYROID  MEDS, THYROID  TESTS CAME BACK NORMAL   Vertigo    Hx of   Past Surgical History:  Procedure Laterality Date   ANKLE SURGERY Left    APPENDECTOMY     BLADDER REPAIR     CARDIAC CATHETERIZATION     CARDIAC SURGERY     CATARACT EXTRACTION W/PHACO Left 06/03/2017   Procedure: CATARACT EXTRACTION PHACO AND INTRAOCULAR LENS PLACEMENT (IOC)  LEFT;  Surgeon: Myrna Adine Anes, MD;  Location: [facility];  Service: Ophthalmology;  Laterality: Left;   CATARACT EXTRACTION W/PHACO  Right 07/08/2017   Procedure: CATARACT EXTRACTION PHACO AND INTRAOCULAR LENS PLACEMENT (IOC) RIGHT;  Surgeon: Myrna Adine Anes, MD;  Location: [facility];  Service: Ophthalmology;  Laterality: Right;   COLONOSCOPY N/A 01/08/2017   Procedure: COLONOSCOPY;  Surgeon: Unk Corinn Skiff, MD;  Location: [facility];  Service: Gastroenterology;  Laterality: N/A;   CORONARY ARTERY BYPASS GRAFT  2001   x4   GASTRIC BYPASS     SLEEVE   HAND SURGERY Right    2 TRIGGER FINGERS   KNEE ARTHROSCOPY Right    renal colic     TRIGGER FINGER RELEASE Right 01/17/2016   Procedure: RELEASE TRIGGER FINGER/A-1 PULLEY THUMB;  Surgeon: Norleen JINNY Maltos, MD;  Location: [facility];  Service: Orthopedics;  Laterality: Right;   Patient Active Problem List   Diagnosis Date Noted   Monoclonal gammopathy 02/24/2023   Chest pain 11/24/2019   Genetic testing 02/02/2019   Family history of ovarian cancer    Family history of bone cancer    Family history of lung cancer    CAD (coronary artery disease) 07/03/2017   Hypotension 07/03/2017   Proctitis 01/07/2017  Constipation    LLQ abdominal pain    Abnormal CT of the abdomen    Tremor 08/07/2016   SVT (supraventricular tachycardia) (HCC) 06/25/2016   Trigger finger of right thumb 01/18/2016   Carpal tunnel syndrome 10/13/2015   Bilateral carpal tunnel syndrome 10/13/2015   Arthralgia of multiple joints 07/03/2015   CFIDS (chronic fatigue and immune dysfunction syndrome) (HCC) 07/03/2015   Degeneration of intervertebral disc of lumbar region 07/03/2015   Hand paresthesia 07/03/2015   Major depressive disorder, recurrent, severe without psychotic features (HCC) 12/21/2014   Panic disorder 12/21/2014   Impingement syndrome of shoulder 07/22/2014   Infraspinatus tenosynovitis 07/22/2014   Impingement syndrome of left shoulder 07/22/2014   Other synovitis and tenosynovitis, left shoulder 07/22/2014   Left supraspinatus tenosynovitis 07/22/2014    Acquired hypothyroidism 07/12/2014   Pain in shoulder 07/12/2014   Clinical depression 07/12/2014   Hyperlipidemia 07/12/2014    ONSET DATE: 11/12/2023  REFERRING DIAG: Right Carpal Tunnel Release  THERAPY DIAG:  Muscle weakness (generalized)  Other lack of coordination  Rationale for Evaluation and Treatment: Rehabilitation  SUBJECTIVE:   SUBJECTIVE STATEMENT:  Pt. reports that she had a fall last night, and hit her right wrist hard through the incision sight when attempting to break her fall with an outstretched hand. Pt. reports that she also bumped her head.  Pt accompanied by: self  PERTINENT HISTORY:  Pt. is a 40 y. o. who had Carpal Tunnel Surgery on 11/12/2023. Pt. 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 Pt. has had recent injections of the Right 1st digit CMC arthritis, and injections to the left 2nd, and 3rd digit trigger fingers. PMHx includes: CAD with CABG in 2001, Gastric Bypass surgery, Anxiety, Depression, Disease of the thyroid  gland, HTN, and OSA.  PRECAUTIONS: None  WEIGHT BEARING RESTRICTIONS: No  PAIN:  Are you having pain? 1/10- Pt. Reports 10/10 pain when attempting to use her hand, or pick up her pet.  FALLS: Has patient fallen in last 6 months? No  LIVING ENVIRONMENT: Lives with: lives with their spouse Lives in: House/apartment Stairs: one story Has following equipment: Has access to wrist braces in needed  PLOF:  Difficulty typing prior to the surgery  PATIENT GOALS:  To relief pain, and increase function  OBJECTIVE:  Note: Objective measures were completed at Evaluation unless otherwise noted.  HAND DOMINANCE: Right  ADLs:  Transfers/ambulation related to ADLs: Eating: Independent, self-feeding, difficulty cutting food, opening bottles, packets, containers Grooming: Painful when brushing teeth with the right hand UB Dressing: Pt. Reports that she is improving with fastening a  bra LB  Dressing:  zipping pants, buttoning pants are difficult to perform Toileting: Difficulty with toileting hygiene care Bathing: Independent-Takes tub baths Tub Shower transfers: Difficulty at times getting in and out of the tub for tub baths.  IADLs: Shopping: Carrying heavy it Light housekeeping: Pt. Husband performs housekeeping tasks Meal Prep: Husband typically performs meal prep tasks.  Community mobility:  Independent. Medication management:  Difficulty opening bottles Financial management: No change Handwriting: 50% legibility for name   Hobbies: Woodworking/carving/building furniture   MOBILITY STATUS: Independent  FUNCTIONAL OUTCOME MEASURES: MAM-20: 64/80  UPPER EXTREMITY ROM:    Active ROM Right Eval  Left eval  Shoulder flexion [facility]  Shoulder abduction [facility]  Shoulder adduction    Shoulder extension    Shoulder internal rotation    Shoulder external rotation    Elbow flexion [facility]  Elbow extension [facility]  Wrist flexion 74(74) 86(86)  Wrist extension 64(68) 64(66)  Wrist ulnar deviation 32(34) 38(38)  Wrist radial deviation 24(26) 24(26)  Wrist pronation    Wrist supination    (Blank rows = not tested)  UPPER EXTREMITY MMT:     MMT Right Eval  Left Eval   Shoulder flexion 5/5 5/5  Shoulder abduction 5/5 5/5  Shoulder adduction    Shoulder extension    Shoulder internal rotation    Shoulder external rotation    Middle trapezius    Lower trapezius    Elbow flexion 5/5 5/5  Elbow extension 5/5 5/5  Wrist flexion 4/5 5/5  Wrist extension 4/5 5/5  Wrist ulnar deviation    Wrist radial deviation    Wrist pronation    Wrist supination    (Blank rows = not tested)  HAND FUNCTION: Grip strength: Right: 10 lbs; Left: 25 lbs, Lateral pinch: Right: 8 lbs, Left: 10 lbs, and 3 point pinch: Right: 4 lbs, Left: 7 lbs  COORDINATION: 9 Hole Peg test: Right: 27 sec; Left: 21 sec  SENSATION: Light touch: WFL  EDEMA: none  MUSCLE TONE:   Intact  COGNITION: Overall cognitive status: Within functional limits for tasks assessed   TREATMENT DATE: 12/09/23   Contrast Bath:  Contrasting heat pack for 3 min. followed by cold pack for 1 min. for 3 trials ending with 3 min. of heat for a total of 15 min. to the Right hand 2/2 pain, edema, and stiffness. Contrast bath was performed in preparation for manual therapy, and there. Ex.     Manual Therapy:   -Pt. tolerated soft tissue massage to the right hand at the volar aspect of the hand medial, and lateral to the incision, the thenar eminence, hypothenar eminence, and the carpal tunnel  2/2 pain.  -Pt. tolerated gentle scar massage at the right carpal tunnel incision site   PATIENT EDUCATION: Education details: contrast bath,  STM Person educated: Patient Education method: Explanation, Demonstration, and Tactile cues Education comprehension: verbalized understanding, returned demonstration, verbal cues required, tactile cues required, and needs further education  HOME EXERCISE PROGRAM: .  Continue to assess need for, and provide as indicated  GOALS: Goals reviewed with patient? No  SHORT TERM GOALS: Target date: 01/15/2024   Pt. will be independent with HEPs for RUE strength Baseline: Eval: No current HEP Goal status: INITIAL  LONG TERM GOALS: Target date: 02/26/2024  To decrease pain by 2 points on the pain scale to be able to engage in ADLs/IADLs Baseline: Eval: 2/10 with 8/10 intermittent shooting pain in the right wrist/hand. Goal status: INITIAL  2.  Pt. Will  improve right grip strength by 5# to be able to open jars, and containers  more efficiently.  Baseline: Eval: Grip strength: Right: 10#, Left: 25# Goal status: INITIAL  3.  Pt. Will improve right lateral pinch strength by 3# to be able to hold and use a toothbrush efficiently. Baseline: Eval: Lateral pinch strength: Right: 8#, Left 10 # Goal status: INITIAL  4.  Pt. Will improve right 3pt. pinch  strength to be able to to be able to cut food. Baseline: Eval: 3Pt. Pinch strength: Right: 4#, L: 7# Goal status: INITIAL  5.  Pt. Will improve right hand [facility] skills by 2 sec. Of speed to be able to grasp and manage pant buttons, and zippers. Baseline: Eval: FMC skills: Right: 27 sec., Left: 21 sec.  Goal status: INITIAL  6.  Pt. Will improve  MAM-20 score by 3 points to reflect improved ADL/IADL performance Baseline: Eval: MAM-20 sum score: 64/80 Goal status: INITIAL  ASSESSMENT:  CLINICAL IMPRESSION:  Pt. had a fall last night onto a tile surface at home. Pt. reports hitting her head. Pt. reports hitting hard through her right wrist on the tile with an outstretched hand at the incision site. Pt. reports 1/10 pain in the right wrist at rest, however has 10/10 sharp shooting pain when attempting to use her hand, and when picking up her pet. Pt. tolerated contrasting heat and cold, and STM  well without increased pain. Pt. reports having a raw sensation at the scar. Pt. Reports that her scar has had redness at the scar since surgery. Pt. Also reports that she is also prone to getting keloid scars at previous surgical sites. Pt. reports that she is in the process of moving, and has to use her right hand for packing, and moving boxes. Treatment was modified this afternoon 2/2 this recent recent fall. Pt. continues to benefit from OT services to work on improving right hand pain, increasing right grip, and pinch strength, and improve [facility] skills in order to be able to perform daily ADL, and IADL tasks more efficiently.  PERFORMANCE DEFICITS: in functional skills including ADLs, IADLs, coordination, dexterity, ROM, strength, pain, Fine motor control, Gross motor control, and UE functional use, cognitive skills including , and psychosocial skills including environmental adaptation and routines and behaviors.   IMPAIRMENTS: are limiting patient from ADLs, IADLs, and leisure.   CO-MORBIDITIES: may have  co-morbidities  that affects occupational performance. Patient will benefit from skilled OT to address above impairments and improve overall function.  MODIFICATION OR ASSISTANCE TO COMPLETE EVALUATION: Min-Moderate modification of tasks or assist with assess necessary to complete an evaluation.  OT OCCUPATIONAL PROFILE AND HISTORY: Detailed assessment: Review of records and additional review of physical, cognitive, psychosocial history related to current functional performance.  CLINICAL DECISION MAKING: Moderate - several treatment options, min-mod task modification necessary  REHAB POTENTIAL: Good  EVALUATION COMPLEXITY: Moderate    PLAN:  OT FREQUENCY: 2x/week  OT DURATION: 12 weeks  PLANNED INTERVENTIONS: 97535 self care/ADL training, 02889 therapeutic exercise, 97530 therapeutic activity, 97112 neuromuscular re-education, 97140 manual therapy, 97035 ultrasound, 97018 paraffin, 02989 moist heat, 97010 cryotherapy, 97034 contrast bath, 97033 iontophoresis, 97760 Orthotic Initial, 97763 Orthotic/Prosthetic subsequent, scar mobilization, patient/family education, and DME and/or AE instructions  RECOMMENDED OTHER SERVICES: None  CONSULTED AND AGREED WITH PLAN OF CARE: Patient  PLAN FOR NEXT SESSION: Treatment  Richardson Otter, MS, OTR/L   12/09/2023, 2:30 PM

## 2023-12-10 ENCOUNTER — Emergency Department

## 2023-12-10 ENCOUNTER — Other Ambulatory Visit: Payer: Self-pay

## 2023-12-10 ENCOUNTER — Emergency Department
Admission: EM | Admit: 2023-12-10 | Discharge: 2023-12-10 | Disposition: A | Discharge status: Home / Self Care | Source: Ambulatory Visit | Attending: Emergency Medicine | Admitting: Emergency Medicine

## 2023-12-10 DIAGNOSIS — M25531 Pain in right wrist: Secondary | ICD-10-CM | POA: Diagnosis present

## 2023-12-10 DIAGNOSIS — W19XXXA Unspecified fall, initial encounter: Secondary | ICD-10-CM | POA: Insufficient documentation

## 2023-12-10 DIAGNOSIS — R42 Dizziness and giddiness: Secondary | ICD-10-CM | POA: Diagnosis present

## 2023-12-10 DIAGNOSIS — Z9181 History of falling: Secondary | ICD-10-CM | POA: Diagnosis present

## 2023-12-10 LAB — COMPREHENSIVE METABOLIC PANEL WITH GFR
ALT: 25 U/L (ref 0–44)
AST: 31 U/L (ref 15–41)
Albumin: 3.7 g/dL (ref 3.5–5.0)
Alkaline Phosphatase: 80 U/L (ref 38–126)
Anion gap: 8 (ref 5–15)
BUN: 21 mg/dL (ref 8–23)
CO2: 22 mmol/L (ref 22–32)
Calcium: 9.1 mg/dL (ref 8.9–10.3)
Chloride: 111 mmol/L (ref 98–111)
Creatinine, Ser: 0.96 mg/dL (ref 0.44–1.00)
GFR, Estimated: >60 mL/min (ref >60)
Glucose, Bld: 117 mg/dL — ABNORMAL HIGH (ref 70–99)
Potassium: 4 mmol/L (ref 3.5–5.1)
Sodium: 141 mmol/L (ref 135–145)
Total Bilirubin: 0.7 mg/dL (ref 0.0–1.2)
Total Protein: 7 g/dL (ref 6.5–8.1)

## 2023-12-10 LAB — CBC
HCT: 48.4 % — ABNORMAL HIGH (ref 36.0–46.0)
Hemoglobin: 15.8 g/dL — ABNORMAL HIGH (ref 12.0–15.0)
MCH: 32 pg (ref 26.0–34.0)
MCHC: 32.6 g/dL (ref 30.0–36.0)
MCV: 98 fL (ref 80.0–100.0)
Platelets: 232 K/uL (ref 150–400)
RBC: 4.94 MIL/uL (ref 3.87–5.11)
RDW: 13.4 % (ref 11.5–15.5)
WBC: 6.4 K/uL (ref 4.0–10.5)
nRBC: 0 % (ref 0.0–0.2)

## 2023-12-10 NOTE — ED Triage Notes (Signed)
 Pt to ED via POV from [facility]. Pt reports fell x2 days ago after feeling dizzy and unsteady. Pt reports dizziness and HA since fall. No blood thinners. Pt also reports right wrist pain. Pt states they have been adjusting BP meds due to low BP.

## 2023-12-10 NOTE — ED Triage Notes (Signed)
 Arrives from [facility] for ED evaluation.  Per report, patient fall 2 days ago and presented today c/o dizziness and being unsteady. VS wnl per report.

## 2023-12-10 NOTE — ED Provider Notes (Addendum)
 [facility] Provider Note    Event Date/Time   First MD Initiated Contact with Patient 12/10/23 1639     (approximate)   History   Fall   HPI  Lori Wagner is a 73 y.o. female with a history of hyperlipidemia who comes in with concerns for fall.  Patient reports having a fall on Monday night.  She reports that she stood up out of bed and immediately started feeling lightheaded like she is in a pass out but rather than sitting back down she continue to walk to the bathroom where she had a fall.  Unclear if she had LOC or not.  She reports going over to the clinic in order to get an x-ray of her right wrist as that was the only thing that was still bothering her a few days out.  She reports recently having carpal tunnel surgery.  But given she was having some increased pain in this area she wanted to ensure that there was no other injuries of the wrist.  She denies really any continued dizziness at this time denies chest pain shortness of breath abdominal pain.  She reports occasionally having episodes like this in the past.  Patient reports that yesterday she just felt like she was dehydrated and this could have contributed to the dizziness and so she drink plenty of fluids and that she reports that the lightheadedness has resolved.     Physical Exam   Triage Vital Signs: ED Triage Vitals [12/10/23 1336]  Encounter Vitals Group     BP 115/80     Girls Systolic BP Percentile      Girls Diastolic BP Percentile      Boys Systolic BP Percentile      Boys Diastolic BP Percentile      Pulse Rate 85     Resp 18     Temp 98.1 F (36.7 C)     Temp Source Oral     SpO2 98 %     Weight      Height      Head Circumference      Peak Flow      Pain Score 6     Pain Loc      Pain Education      Exclude from Growth Chart     Most recent vital signs: Vitals:   12/10/23 1336  BP: 115/80  Pulse: 85  Resp: 18  Temp: 98.1 F (36.7 C)  SpO2: 98%      General: Awake, no distress.  CV:  Good peripheral perfusion.  Resp:  Normal effort.  Abd:  No distention.  Other:  Cranial nerves are intact equal strength in arms and legs no current dizziness.  Finger-nose intact.  She is got tenderness on the right snuff box.  Old scar from recent carpal tunnel surgery.  Nerves are intact.  Sensation intact good distal pulse.  No chest wall tenderness no abdominal tenderness no hematoma of the head.   ED Results / Procedures / Treatments   Labs (all labs ordered are listed, but only abnormal results are displayed) Labs Reviewed  COMPREHENSIVE METABOLIC PANEL WITH GFR - Abnormal; Notable for the following components:      Result Value   Glucose, Bld 117 (*)    All other components within normal limits  CBC - Abnormal; Notable for the following components:   Hemoglobin 15.8 (*)    HCT 48.4 (*)    All other components within normal  limits  URINALYSIS, ROUTINE W REFLEX MICROSCOPIC  CBG MONITORING, ED     EKG  My interpretation of EKG:  Sinus rate of 85 without any ST elevation or T wave inversions does have a right bundle branch block  RADIOLOGY I have reviewed the xray personally and interpreted no fracture noted   PROCEDURES:  Critical Care performed: No  Procedures   MEDICATIONS ORDERED IN ED: Medications - No data to display   IMPRESSION / MDM / ASSESSMENT AND PLAN / ED COURSE  I reviewed the triage vital signs and the nursing notes.   Patient's presentation is most consistent with acute presentation with potential threat to life or bodily function.  Patient comes in with a fall possible syncopal episode on Monday.  Workup done from triage to evaluate for intercranial hemorrhage, cervical fracture, fracture, electro abnormalities, AKI.  X-ray of wrist was negative CT head and neck were negative  CBC is normal CMP normal  Patient does have snuffbox tenderness will place in a splint.  Patient does not want a  nonremovable splint given she has exercise she needs to do for her carpal tunnel therefore patient placed in a removable splint that covers her thumb.  She understands that she is to follow with orthopedics for repeat x-ray and return to the ER she develops worsening symptoms or any other concerns.  We discussed getting a troponin to ensure no evidence of ACS given the syncopal episode but patient declined denies any chest pain, shortness of breath, current dizziness she states that she is only here because she went to get an x-ray of her wrist to ensure that there was no fracture.  She states that she is not sure why the [facility] clinic made her come here and be here for so long and have all this workup done as she just wanted to ensure that her wrist was okay therefore she declines any additional workup.  She understands if she develops chest pain, shortness of breath, recurrent syncope, dizziness that she should return to the ER for repeat evaluation.  We do not have a thumb spica splint but she did not want to have a nonremovable splint placed she will buy 1 off of [employer].   FINAL CLINICAL IMPRESSION(S) / ED DIAGNOSES   Final diagnoses:  Fall, initial encounter  Right wrist pain     Rx / DC Orders   ED Discharge Orders     None        Note:  This document was prepared using Dragon voice recognition software and may include unintentional dictation errors.   Ernest Ronal BRAVO, MD 12/10/23 1704    Ernest Ronal BRAVO, MD 12/10/23 [id number]

## 2023-12-10 NOTE — Discharge Instructions (Addendum)
 We are placing a splint in case you have a scaphoid injury that was missed on x-ray given you are tender in this area however you should call to make a follow-up appointment with orthopedics in about 7 days and they can repeat the x-ray and see if any additional workup is needed.  We discussed troponins to rule out any issues with your heart but you have declined additional workup as you were mostly here just for your hand however if you develop any chest pain, shortness of breath, recurrent dizziness you should return to the ER for repeat evaluation

## 2023-12-10 NOTE — ED Notes (Signed)
 Pt states that she discussed with the provider buying a wrist splint from [facility]. Pt verbalizes understanding of discharge instructions. Opportunity for questioning and answers were provided. Pt discharged from ED to home.

## 2023-12-11 ENCOUNTER — Ambulatory Visit: Admitting: Occupational Therapy

## 2023-12-15 ENCOUNTER — Ambulatory Visit: Admitting: Occupational Therapy

## 2023-12-15 DIAGNOSIS — M6281 Muscle weakness (generalized): Secondary | ICD-10-CM | POA: Diagnosis not present

## 2023-12-15 DIAGNOSIS — R278 Other lack of coordination: Secondary | ICD-10-CM

## 2023-12-15 NOTE — Therapy (Addendum)
 OUTPATIENT OCCUPATIONAL THERAPY TREATMENT  Patient Name: Lori Wagner MRN: [id number] DOB:05-Jun-1950, 73 y.o., female Today's Date: 12/15/2023  PCP: Lori Agent, MD REFERRING PROVIDER: Sheryle Sharper, MD  END OF SESSION:  OT End of Session - 12/15/23 1713     Visit Number 3    Number of Visits 24    Date for OT Re-Evaluation 02/26/24    OT Start Time 1400    OT Stop Time 1445    OT Time Calculation (min) 45 min    Activity Tolerance Patient tolerated treatment well    Behavior During Therapy WFL for tasks assessed/performed           Past Medical History:  Diagnosis Date   Actinic keratosis    Anxiety    Arthritis    knees,hip , back and hands   Chronic kidney disease    Hx of kidney stones, years ago   Coronary artery disease    Dysrhythmia    SVT- controlled with Beta blocker   Family history of bone cancer    Family history of lung cancer    Family history of ovarian cancer    Fatigue    HOH (hard of hearing)    tinnitis both ears   Hypertension     can cause lightheadedness   Motion sickness    Neuromuscular disorder (HCC)    carpal tunnel both hands   PONV (postoperative nausea and vomiting)    Post herpetic neuralgia    left side of face   RA (rheumatoid arthritis) (HCC)    Shingles 02/2017   left side of face   Sleep apnea    Hx of/ when overweight, no CPAP   SVT (supraventricular tachycardia) (HCC)    Thyroid  disease    HX OF/ NO LONGER TAKING THYROID  MEDS, THYROID  TESTS CAME BACK NORMAL   Vertigo    Hx of   Past Surgical History:  Procedure Laterality Date   ANKLE SURGERY Left    APPENDECTOMY     BLADDER REPAIR     CARDIAC CATHETERIZATION     CARDIAC SURGERY     CATARACT EXTRACTION W/PHACO Left 06/03/2017   Procedure: CATARACT EXTRACTION PHACO AND INTRAOCULAR LENS PLACEMENT (IOC)  LEFT;  Surgeon: Lori Adine Anes, MD;  Location: [facility];  Service: Ophthalmology;  Laterality: Left;   CATARACT EXTRACTION W/PHACO  Right 07/08/2017   Procedure: CATARACT EXTRACTION PHACO AND INTRAOCULAR LENS PLACEMENT (IOC) RIGHT;  Surgeon: Lori Adine Anes, MD;  Location: [facility];  Service: Ophthalmology;  Laterality: Right;   COLONOSCOPY N/A 01/08/2017   Procedure: COLONOSCOPY;  Surgeon: Lori Corinn Skiff, MD;  Location: [facility] END[facility]COPY;  Service: Gastroenterology;  Laterality: N/A;   CORONARY ARTERY BYPASS GRAFT  2001   x4   GASTRIC BYPASS     SLEEVE   HAND SURGERY Right    2 TRIGGER FINGERS   KNEE ARTHROSCOPY Right    renal colic     TRIGGER FINGER RELEASE Right 01/17/2016   Procedure: RELEASE TRIGGER FINGER/A-1 PULLEY THUMB;  Surgeon: Lori JINNY Maltos, MD;  Location: [facility];  Service: Orthopedics;  Laterality: Right;   Patient Active Problem List   Diagnosis Date Noted   Monoclonal gammopathy 02/24/2023   Chest pain 11/24/2019   Genetic testing 02/02/2019   Family history of ovarian cancer    Family history of bone cancer    Family history of lung cancer    CAD (coronary artery disease) 07/03/2017   Hypotension 07/03/2017   Proctitis 01/07/2017  Constipation    LLQ abdominal pain    Abnormal CT of the abdomen    Tremor 08/07/2016   SVT (supraventricular tachycardia) (HCC) 06/25/2016   Trigger finger of right thumb 01/18/2016   Carpal tunnel syndrome 10/13/2015   Bilateral carpal tunnel syndrome 10/13/2015   Arthralgia of multiple joints 07/03/2015   CFIDS (chronic fatigue and immune dysfunction syndrome) (HCC) 07/03/2015   Degeneration of intervertebral disc of lumbar region 07/03/2015   Hand paresthesia 07/03/2015   Major depressive disorder, recurrent, severe without psychotic features (HCC) 12/21/2014   Panic disorder 12/21/2014   Impingement syndrome of shoulder 07/22/2014   Infraspinatus tenosynovitis 07/22/2014   Impingement syndrome of left shoulder 07/22/2014   Other synovitis and tenosynovitis, left shoulder 07/22/2014   Left supraspinatus tenosynovitis 07/22/2014    Acquired hypothyroidism 07/12/2014   Pain in shoulder 07/12/2014   Clinical depression 07/12/2014   Hyperlipidemia 07/12/2014    ONSET DATE: 11/12/2023  REFERRING DIAG: Right Carpal Tunnel Release  THERAPY DIAG:  Muscle weakness (generalized)  Other lack of coordination  Rationale for Evaluation and Treatment: Rehabilitation  SUBJECTIVE:   SUBJECTIVE STATEMENT:  Pt. Reports that she went into the ED to be seen due to recent fall, on 12/08/2023.   Pt accompanied by: self  PERTINENT HISTORY:  Pt. is a 9 y. o. who had Carpal Tunnel Surgery on 11/12/2023. Pt. 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 Pt. has had recent injections of the Right 1st digit CMC arthritis, and injections to the left 2nd, and 3rd digit trigger fingers. PMHx includes: CAD with CABG in 2001, Gastric Bypass surgery, Anxiety, Depression, Disease of the thyroid  gland, HTN, and OSA.  PRECAUTIONS: None  WEIGHT BEARING RESTRICTIONS: No  PAIN:  Are you having pain? 12/15/2023: 2/10 pain at rest, 7-8/10 raw sensation at the incision scar. 4-5/10 pain with AROM during wrist extension.  1/10- Pt. Reports 10/10 pain when attempting to use her hand, or pick up her pet  FALLS: Has patient fallen in last 6 months? Yes, fall occurred on 12/08/2023  LIVING ENVIRONMENT: Lives with: lives with their spouse Lives in: House/apartment Stairs: one story Has following equipment: Has access to wrist braces in needed  PLOF:  Difficulty typing prior to the surgery  PATIENT GOALS:  To relief pain, and increase function  OBJECTIVE:  Note: Objective measures were completed at Evaluation unless otherwise noted.  HAND DOMINANCE: Right  ADLs:  Transfers/ambulation related to ADLs: Eating: Independent, self-feeding, difficulty cutting food, opening bottles, packets, containers Grooming: Painful when brushing teeth with the right hand UB Dressing: Pt. Reports that she is  improving with fastening a  bra LB Dressing:  zipping pants, buttoning pants are difficult to perform Toileting: Difficulty with toileting hygiene care Bathing: Independent-Takes tub baths Tub Shower transfers: Difficulty at times getting in and out of the tub for tub baths.  IADLs: Shopping: Carrying heavy it Light housekeeping: Pt. Husband performs housekeeping tasks Meal Prep: Husband typically performs meal prep tasks.  Community mobility:  Independent. Medication management:  Difficulty opening bottles Financial management: No change Handwriting: 50% legibility for name   Hobbies: Woodworking/carving/building furniture   MOBILITY STATUS: Independent  FUNCTIONAL OUTCOME MEASURES: MAM-20: 64/80  UPPER EXTREMITY ROM:    Active ROM Right Eval  Right 12/15/2023 Left eval  Shoulder flexion [facility]  [facility]  Shoulder abduction [facility]  [facility]  Shoulder adduction     Shoulder extension     Shoulder internal rotation  Shoulder external rotation     Elbow flexion [facility]  WFL  Elbow extension [facility]  [facility]  Wrist flexion 74(74)  86(86)  Wrist extension 64(68) 22(comfort level), 44 (52 passively) 64(66)  Wrist ulnar deviation 32(34)  38(38)  Wrist radial deviation 24(26)  24(26)  Wrist pronation     Wrist supination     (Blank rows = not tested)  UPPER EXTREMITY MMT:     MMT Right Eval  Left Eval   Shoulder flexion 5/5 5/5  Shoulder abduction 5/5 5/5  Shoulder adduction    Shoulder extension    Shoulder internal rotation    Shoulder external rotation    Middle trapezius    Lower trapezius    Elbow flexion 5/5 5/5  Elbow extension 5/5 5/5  Wrist flexion 4/5 5/5  Wrist extension 4/5 5/5  Wrist ulnar deviation    Wrist radial deviation    Wrist pronation    Wrist supination    (Blank rows = not tested)  HAND FUNCTION: Grip strength: Right: 10 lbs; Left: 25 lbs, Lateral pinch: Right: 8 lbs, Left: 10 lbs, and 3 point pinch: Right: 4 lbs, Left: 7 lbs  COORDINATION: 9 Hole Peg  test: Right: 27 sec; Left: 21 sec  SENSATION: Light touch: WFL  EDEMA: none  MUSCLE TONE:  Intact  COGNITION: Overall cognitive status: Within functional limits for tasks assessed   TREATMENT DATE: 12/15/23   Contrast Bath:  Contrasting heat pack for 3 min. followed by cold pack for 1 min. for 3 trials ending with 3 min. of heat for a total of 15 min. to the Right hand 2/2 pain, edema, and stiffness. Contrast bath was performed in preparation for manual therapy, and there. Ex.     Manual Therapy:   -Pt. tolerated soft tissue massage to the right hand at the volar aspect of the hand medial, and lateral to the incision, the thenar eminence, hypothenar eminence, and the carpal tunnel  2/2 pain.  -Pt. tolerated gentle scar massage at the right carpal tunnel incision site.  Therapeutic Ex.:  -Pt. Received education and returned demonstration on blue resistive foam block exercises including: 3 pt. Pinch, lateral pinch and gross grip, stabilizing elbow on a flat tabletop surface.  - Pt. Performed AAROM/AROM/PROM R wrist extension/flexion with stabilizing R elbow on tabletop surface.   PATIENT EDUCATION: Education details: contrast bath, STM, blue resistive foam block exercises Person educated: Patient Education method: Explanation, Demonstration, and Tactile cues Education comprehension: verbalized understanding, returned demonstration, verbal cues required, tactile cues required, and needs further education  HOME EXERCISE PROGRAM: Blue Resistive Foam Block exercises: 3 point pinch, lateral pinch and gross grip.  GOALS: Goals reviewed with patient? No  SHORT TERM GOALS: Target date: 01/15/2024   Pt. will be independent with HEPs for RUE strength Baseline: Eval: No current HEP Goal status: INITIAL  LONG TERM GOALS: Target date: 02/26/2024  To decrease pain by 2 points on the pain scale to be able to engage in ADLs/IADLs Baseline: Eval: 2/10 with 8/10 intermittent shooting pain  in the right wrist/hand. Goal status: INITIAL  2.  Pt. Will  improve right grip strength by 5# to be able to open jars, and containers  more efficiently.  Baseline: Eval: Grip strength: Right: 10#, Left: 25# Goal status: INITIAL  3.  Pt. Will improve right lateral pinch strength by 3# to be able to hold and use a toothbrush efficiently. Baseline: Eval: Lateral pinch strength: Right: 8#, Left 10 # Goal status: INITIAL  4.  Pt. Will improve right 3pt. pinch strength to be able to to be able to cut food. Baseline: Eval: 3Pt. Pinch strength: Right: 4#, L: 7# Goal status: INITIAL  5.  Pt. Will improve right hand [facility] skills by 2 sec. Of speed to be able to grasp and manage pant buttons, and zippers. Baseline: Eval: FMC skills: Right: 27 sec., Left: 21 sec.  Goal status: INITIAL  6.  Pt. Will improve MAM-20 score by 3 points to reflect improved ADL/IADL performance Baseline: Eval: MAM-20 sum score: 64/80 Goal status: INITIAL  ASSESSMENT:  CLINICAL IMPRESSION:  Pt. tolerated soft tissue/scar massage and contrast bath well today. Pt. Presents with 4-5/10 pain during AROM/PROM during wrist extension, and 7-8/10 raw pain at the incision site. Pt. Has limited AROM with  wrist flexion/extension due pain at incision site. Provided education on contrast bath, blue foam block exercises for pinch/grip strengthening, and soft tissue/scar massage. Pt. Was started on blue level resistance foam for gross gripping, lateral, and 3pt. Pinch strengthening . Pt. Indicated no pain while using blue resistive foam block for pinch/grip strengthening exercises. Pt. continues to benefit from OT services to work on improving right hand pain, increasing right grip, and pinch strength, and improve [facility] skills in order to be able to perform daily ADL, and IADL tasks more efficiently.  PERFORMANCE DEFICITS: in functional skills including ADLs, IADLs, coordination, dexterity, ROM, strength, pain, Fine motor control, Gross  motor control, and UE functional use, cognitive skills including , and psychosocial skills including environmental adaptation and routines and behaviors.   IMPAIRMENTS: are limiting patient from ADLs, IADLs, and leisure.   CO-MORBIDITIES: may have co-morbidities  that affects occupational performance. Patient will benefit from skilled OT to address above impairments and improve overall function.  MODIFICATION OR ASSISTANCE TO COMPLETE EVALUATION: Min-Moderate modification of tasks or assist with assess necessary to complete an evaluation.  OT OCCUPATIONAL PROFILE AND HISTORY: Detailed assessment: Review of records and additional review of physical, cognitive, psychosocial history related to current functional performance.  CLINICAL DECISION MAKING: Moderate - several treatment options, min-mod task modification necessary  REHAB POTENTIAL: Good  EVALUATION COMPLEXITY: Moderate    PLAN:  OT FREQUENCY: 2x/week  OT DURATION: 12 weeks  PLANNED INTERVENTIONS: 97535 self care/ADL training, 02889 therapeutic exercise, 97530 therapeutic activity, 97112 neuromuscular re-education, 97140 manual therapy, 97035 ultrasound, 97018 paraffin, 02989 moist heat, 97010 cryotherapy, 97034 contrast bath, 97033 iontophoresis, 97760 Orthotic Initial, 97763 Orthotic/Prosthetic subsequent, scar mobilization, patient/family education, and DME and/or AE instructions  RECOMMENDED OTHER SERVICES: None  CONSULTED AND AGREED WITH PLAN OF CARE: Patient  PLAN FOR NEXT SESSION: Treatment  Damien Nap, OTS   Richardson Otter, MS, OTR/L   12/15/2023, 5:16 PM

## 2023-12-15 NOTE — Progress Notes (Signed)
 Chief Complaint: Chief Complaint  Patient presents with  . Right Wrist - Pain    ER FU     History of Present Illness Lori Wagner is a 73 year old female who presents with wrist pain following a fall.  She experienced wrist pain after a fall one week ago. Initially severe, the pain has improved significantly and is now described as 'like a hundred percent' better compared to her ER visit. The pain is localized to the wrist.  She has a history of carpal tunnel syndrome and underwent carpal tunnel surgery in [place]. She uses Tylenol  and Voltaren gel for pain management. There is no pain when grasping or gripping objects, and no prior pain in this area.  An x-ray at the ER on the 16th showed no fractures, but some arthritic changes. She is currently moving, involving packing but not lifting, as her husband handles heavier tasks.  She is right-handed and avoids activities that might exacerbate her symptoms.   Past Medical History: Past Medical History:  Diagnosis Date  . Allergic state 1995   Hay fever mostly  . Anxiety 2003  . Arrhythmia 1996   SVT controlled with beta-blocker  . Arthritis    Knees, hands  . Cataract cortical, senile   . Constipation   . Coronary artery disease   . Degenerative disc disease, lumbar   . Depression   . Encounter for blood transfusion 2001   Internal bleeding following cath  . GERD (gastroesophageal reflux disease) 2000   No longer a problem  . Heart disease   . History of blood in urine   . History of kidney stones   . History of motion sickness   . History of prolapse of bladder   . Hyperlipidemia 1995   Under control with meds  . Hypertension   . Migraine   . PONV (postoperative nausea and vomiting)   . Sleep apnea    Under control with weight loss  . Thyroid  disease     Past Surgical History: Past Surgical History:  Procedure Laterality Date  . BUNION CORRECTION  1998  . APPENDECTOMY  2000  . CORONARY ARTERY BYPASS  GRAFT  2001  . CORONARY ANGIOPLASTY  2001  . Release right trigger thumb  Right 01/17/2016   Dr.Poggi   . FUSION GREAT TOE Left 11/03/2018   Procedure: ARTHRODESIS, GREAT TOE; METATARSOPHALANGEAL JOINT;  Surgeon: Reda Mardell Blas, MD;  Location: [facility] OR;  Service: Orthopedics;  Laterality: Left;  . OSTECTOMY METATARSAL HEAD CLAYTON PROCEDURE Left 11/03/2018   Procedure: OSTECTOMY, COMPLETE EXCISION; ALL METATARSAL HEADS, WITH PARTIAL PROXIMAL PHALANGECTOMY, EXCLUDING FIRST METATARSAL (EG, CLAYTON TYPE PROCEDURE);  Surgeon: Reda Mardell Blas, MD;  Location: [facility] OR;  Service: Orthopedics;  Laterality: Left;  . INTRAOPERATIVE FLUOROSCOPY Left 11/03/2018   Procedure: FLUOROSCOPY;  Surgeon: Reda Mardell Blas, MD;  Location: [facility] OR;  Service: Orthopedics;  Laterality: Left;  . INJECTION ANESTHETIC AGENT OTHER NERVE OR BRANCH Left 11/03/2018   Procedure: INJECTION(S), ANESTHETIC AGENT(S) AND/OR STEROID; OTHER PERIPHERAL NERVE OR BRANCH;  Surgeon: Reda Mardell Blas, MD;  Location: [facility] OR;  Service: Orthopedics;  Laterality: Left;  . cardiac cath  with stent placement  2001, 2005  . COLONOSCOPY  01/08/2017 [facility])  . COLONOSCOPY  03/05/2017 [facility])  . KNEE ARTHROSCOPY    . LAPAROSCOPIC GASTRIC BYPASS      Past Family History: Family History  Problem Relation Age of Onset  . Arthritis Mother   . Dementia Mother   . Heart disease  Mother   . Colon polyps Mother   . Coronary Artery Disease (Blocked arteries around heart) Mother   . Hip fracture Mother   . High blood pressure (Hypertension) Mother   . Osteoarthritis Mother   . Osteoporosis (Thinning of bones) Mother   . Alzheimer's disease Mother        Vascular  . Hyperlipidemia (Elevated cholesterol) Mother   . Obesity Mother   . Stroke Mother        Maybe  . Heart disease Father   . Coronary Artery Disease (Blocked arteries around heart) Father   . Stroke Father        Cerebral Hemorrhage  . Anxiety Brother   . Bipolar disorder  Brother   . Depression Brother   . Myocardial Infarction (Heart attack) Brother   . Coronary Artery Disease (Blocked arteries around heart) Brother   . Obesity Brother   . Anxiety Daughter   . Depression Daughter   . Coronary Artery Disease (Blocked arteries around heart) Brother   . Heart disease Brother   . Myocardial Infarction (Heart attack) Brother 43  . Coronary Artery Disease (Blocked arteries around heart) Maternal Aunt   . Osteoporosis (Thinning of bones) Maternal Aunt   . Osteoarthritis Maternal Aunt   . Coronary Artery Disease (Blocked arteries around heart) Maternal Grandmother   . Coronary Artery Disease (Blocked arteries around heart) Maternal Grandfather     Medications: Current Outpatient Medications  Medication Sig Dispense Refill  . aspirin  81 MG EC tablet Take 81 mg by mouth once daily    . atorvastatin  (LIPITOR) 80 MG tablet Take 80 mg by mouth nightly       . bisoprolol  (ZEBETA ) 5 MG tablet Take 2.5 mg by mouth once daily       . buPROPion  (WELLBUTRIN  SR) 100 MG SR tablet Take 1 tablet (100 mg total) by mouth once daily 90 each 3  . buPROPion  (WELLBUTRIN  SR) 200 MG SR tablet Take 1 tablet (200 mg total) by mouth once daily 90 each 3  . busPIRone (BUSPAR) 15 MG tablet Take 1 tablet (15 mg total) by mouth 2 (two) times daily 180 tablet 3  . cholecalciferol  1000 unit tablet Take by mouth    . DULoxetine  (CYMBALTA ) 60 MG DR capsule TAKE 1 CAPSULE DAILY 90 capsule 3  . empagliflozin (JARDIANCE) 10 mg tablet Take 1 tablet by mouth once daily    . ezetimibe (ZETIA) 10 mg tablet Take 1 tablet by mouth once daily    . gabapentin  (NEURONTIN ) 100 MG capsule 1 po qHS 30 capsule 1  . isosorbide  mononitrate (IMDUR ) 60 MG ER tablet TAKE 1 TABLET BY MOUTH  DAILY    . levothyroxine (SYNTHROID) 100 MCG tablet TAKE 1 TABLET ONCE DAILY (TAKE ON AN EMPTY STOMACH, WITH A GLASS OF WATER AT LEAST 30 TO 60 MINUTES BEFORE BREAKFAST) 90 tablet 3  . nitroGLYcerin  (NITROSTAT ) 0.4 MG SL  tablet Place 0.4 mg under the tongue every 5 (five) minutes as needed for Chest pain May take up to 3 doses.    . polyethylene glycol (MIRALAX ) powder Take by mouth    . traMADoL (ULTRAM) 50 mg tablet Take 1 tablet (50 mg total) by mouth 2 (two) times daily as needed 1 po bid prn 60 tablet 0  . traZODone  (DESYREL ) 50 MG tablet TAKE 2 TABLETS AT BEDTIME 180 tablet 3   No current facility-administered medications for this visit.    Allergies: Allergies  Allergen Reactions  . Ace Inhibitors  Other (See Comments)    Cough and BP drops   . Epinephrine  Other (See Comments)    SVT  . Sulfa (Sulfonamide Antibiotics) Other (See Comments) and Hives    Other Reaction: Other reaction  . Morphine  Nausea And Vomiting  . Opioids - Morphine  Analogues Nausea And Vomiting  . Sulfasalazine Hives  . Artichoke Hives  . Penicillins Hives     Review of Systems:  A comprehensive 14 point ROS was performed, reviewed by me today, and the pertinent orthopaedic findings are documented in the HPI.   Exam: BP 138/88   Ht 165.1 cm (5' 5)   Wt 73 kg (161 lb)   BMI 26.79 kg/m  General/Constitutional: The patient appears to be well-nourished, well-developed, and in no acute distress. Neuro/Psych: Normal mood and affect, oriented to person, place and time. Eyes: Non-icteric.  Pupils are equal, round, and reactive to light, and exhibit synchronous movement. ENT: Unremarkable. Lymphatic: No palpable adenopathy. Respiratory: Non-labored breathing Cardiovascular: No edema, swelling or tenderness, except as noted in detailed exam. Integumentary: No impressive skin lesions present, except as noted in detailed exam. Musculoskeletal: Unremarkable, except as noted in detailed exam.  Right wrist shows minimal tenderness on the radial styloid with negative Finklestein's test although she does have some very subtle discomfort with Finkelstein's test.  No scaphoid tenderness.  No CMC tenderness.  No catching triggering  or locking.  No swelling warmth erythema.  No tenderness along the distal radial metaphyseal region or distal ulna.  No pain with pronation supination flexion or extension.  Previous carpal tunnel incision is healed   EXAM:  RIGHT WRIST - COMPLETE 3+ VIEW   COMPARISON:  None Available.   FINDINGS:  No acute fracture or dislocation. There is no evidence of  arthropathy or other focal bone abnormality. Soft tissues are  unremarkable.   IMPRESSION:  No acute fracture or dislocation.    Electronically Signed    By: Rogelia Myers M.D.    On: 12/10/2023 14:40    AP lateral and oblique views of the right hand are ordered interpreted by me in the office today.  Impression: Mild degenerative changes of the first [facility] joint and scaphotrapezial joint.  No evidence of acute bony abnormality or abnormal bony lesions.  Adequate spacing throughout the carpals.  Impression: Right wrist pain [M25.531] Right wrist pain  (primary encounter diagnosis) De Quervain's tenosynovitis  Plan:  1.  Assessment & Plan De Quervain's tenosynovitis Likely due to trauma from a fall. X-rays show no fracture, only mild arthritic changes. Condition improved since ER visit, suggesting mild case. - Provided information on De Quervain's tenosynovitis. - Continue topical Voltaren gel. - Avoid grasping and gripping activities. - Monitor symptoms. - Consider surgery if conservative measures fail. - Discussed treatments: splint, corticosteroid injection, surgery; prefer conservative management. - Discussed risks of aggravation due to packing; advised precautions. - Surgery as last resort, minimally invasive, local anesthesia, same-day discharge.     This note was generated in part with voice recognition software and I apologize for any typographical errors that were not detected and corrected.   Debby Lonni Amber MPA-C

## 2023-12-19 ENCOUNTER — Ambulatory Visit

## 2023-12-19 DIAGNOSIS — R278 Other lack of coordination: Secondary | ICD-10-CM

## 2023-12-19 DIAGNOSIS — M6281 Muscle weakness (generalized): Secondary | ICD-10-CM

## 2023-12-20 NOTE — Therapy (Addendum)
 OUTPATIENT OCCUPATIONAL THERAPY TREATMENT  Patient Name: Lori Wagner MRN: [id number] DOB:1951/05/17, 73 y.o., female Today's Date: 12/20/2023  PCP: Valora Agent, MD REFERRING PROVIDER: Sheryle Sharper, MD  END OF SESSION:  OT End of Session - 12/20/23 1957     Visit Number 4    Number of Visits 24    Date for OT Re-Evaluation 02/26/24    OT Start Time 0800    OT Stop Time 0845    OT Time Calculation (min) 45 min    Activity Tolerance Patient tolerated treatment well    Behavior During Therapy WFL for tasks assessed/performed           Past Medical History:  Diagnosis Date   Actinic keratosis    Anxiety    Arthritis    knees,hip , back and hands   Chronic kidney disease    Hx of kidney stones, years ago   Coronary artery disease    Dysrhythmia    SVT- controlled with Beta blocker   Family history of bone cancer    Family history of lung cancer    Family history of ovarian cancer    Fatigue    HOH (hard of hearing)    tinnitis both ears   Hypertension     can cause lightheadedness   Motion sickness    Neuromuscular disorder (HCC)    carpal tunnel both hands   PONV (postoperative nausea and vomiting)    Post herpetic neuralgia    left side of face   RA (rheumatoid arthritis) (HCC)    Shingles 02/2017   left side of face   Sleep apnea    Hx of/ when overweight, no CPAP   SVT (supraventricular tachycardia) (HCC)    Thyroid  disease    HX OF/ NO LONGER TAKING THYROID  MEDS, THYROID  TESTS CAME BACK NORMAL   Vertigo    Hx of   Past Surgical History:  Procedure Laterality Date   ANKLE SURGERY Left    APPENDECTOMY     BLADDER REPAIR     CARDIAC CATHETERIZATION     CARDIAC SURGERY     CATARACT EXTRACTION W/PHACO Left 06/03/2017   Procedure: CATARACT EXTRACTION PHACO AND INTRAOCULAR LENS PLACEMENT (IOC)  LEFT;  Surgeon: Myrna Adine Anes, MD;  Location: [facility];  Service: Ophthalmology;  Laterality: Left;   CATARACT EXTRACTION W/PHACO  Right 07/08/2017   Procedure: CATARACT EXTRACTION PHACO AND INTRAOCULAR LENS PLACEMENT (IOC) RIGHT;  Surgeon: Myrna Adine Anes, MD;  Location: [facility];  Service: Ophthalmology;  Laterality: Right;   COLONOSCOPY N/A 01/08/2017   Procedure: COLONOSCOPY;  Surgeon: Unk Corinn Skiff, MD;  Location: [facility];  Service: Gastroenterology;  Laterality: N/A;   CORONARY ARTERY BYPASS GRAFT  2001   x4   GASTRIC BYPASS     SLEEVE   HAND SURGERY Right    2 TRIGGER FINGERS   KNEE ARTHROSCOPY Right    renal colic     TRIGGER FINGER RELEASE Right 01/17/2016   Procedure: RELEASE TRIGGER FINGER/A-1 PULLEY THUMB;  Surgeon: Norleen JINNY Maltos, MD;  Location: [facility];  Service: Orthopedics;  Laterality: Right;   Patient Active Problem List   Diagnosis Date Noted   Monoclonal gammopathy 02/24/2023   Chest pain 11/24/2019   Genetic testing 02/02/2019   Family history of ovarian cancer    Family history of bone cancer    Family history of lung cancer    CAD (coronary artery disease) 07/03/2017   Hypotension 07/03/2017   Proctitis 01/07/2017  Constipation    LLQ abdominal pain    Abnormal CT of the abdomen    Tremor 08/07/2016   SVT (supraventricular tachycardia) (HCC) 06/25/2016   Trigger finger of right thumb 01/18/2016   Carpal tunnel syndrome 10/13/2015   Bilateral carpal tunnel syndrome 10/13/2015   Arthralgia of multiple joints 07/03/2015   CFIDS (chronic fatigue and immune dysfunction syndrome) (HCC) 07/03/2015   Degeneration of intervertebral disc of lumbar region 07/03/2015   Hand paresthesia 07/03/2015   Major depressive disorder, recurrent, severe without psychotic features (HCC) 12/21/2014   Panic disorder 12/21/2014   Impingement syndrome of shoulder 07/22/2014   Infraspinatus tenosynovitis 07/22/2014   Impingement syndrome of left shoulder 07/22/2014   Other synovitis and tenosynovitis, left shoulder 07/22/2014   Left supraspinatus tenosynovitis 07/22/2014    Acquired hypothyroidism 07/12/2014   Pain in shoulder 07/12/2014   Clinical depression 07/12/2014   Hyperlipidemia 07/12/2014    ONSET DATE: 11/12/2023  REFERRING DIAG: Right Carpal Tunnel Release  THERAPY DIAG:  Muscle weakness (generalized)  Other lack of coordination  Rationale for Evaluation and Treatment: Rehabilitation  SUBJECTIVE:   SUBJECTIVE STATEMENT: See below in treatment note for subjective  Pt accompanied by: self  PERTINENT HISTORY:  Pt. is a 73 y. o. who had Carpal Tunnel Surgery on 11/12/2023. Pt. 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 Pt. has had recent injections of the Right 1st digit CMC arthritis, and injections to the left 2nd, and 3rd digit trigger fingers. PMHx includes: CAD with CABG in 2001, Gastric Bypass surgery, Anxiety, Depression, Disease of the thyroid  gland, HTN, and OSA.  PRECAUTIONS: None  WEIGHT BEARING RESTRICTIONS: No  PAIN:  Are you having pain? 12/15/2023: 2/10 pain at rest, 7-8/10 raw sensation at the incision scar. 4-5/10 pain with AROM during wrist extension.  1/10- Pt. Reports 10/10 pain when attempting to use her hand, or pick up her pet  FALLS: Has patient fallen in last 6 months? Yes, fall occurred on 12/08/2023  LIVING ENVIRONMENT: Lives with: lives with their spouse Lives in: House/apartment Stairs: one story Has following equipment: Has access to wrist braces in needed  PLOF:  Difficulty typing prior to the surgery  PATIENT GOALS:  To relief pain, and increase function  OBJECTIVE:  Note: Objective measures were completed at Evaluation unless otherwise noted.  HAND DOMINANCE: Right  ADLs:  Transfers/ambulation related to ADLs: Eating: Independent, self-feeding, difficulty cutting food, opening bottles, packets, containers Grooming: Painful when brushing teeth with the right hand UB Dressing: Pt. Reports that she is improving with fastening a  bra LB Dressing:   zipping pants, buttoning pants are difficult to perform Toileting: Difficulty with toileting hygiene care Bathing: Independent-Takes tub baths Tub Shower transfers: Difficulty at times getting in and out of the tub for tub baths.  IADLs: Shopping: Carrying heavy it Light housekeeping: Pt. Husband performs housekeeping tasks Meal Prep: Husband typically performs meal prep tasks.  Community mobility:  Independent. Medication management:  Difficulty opening bottles Financial management: No change Handwriting: 50% legibility for name   Hobbies: Woodworking/carving/building furniture   MOBILITY STATUS: Independent  FUNCTIONAL OUTCOME MEASURES: MAM-20: 64/80  UPPER EXTREMITY ROM:    Active ROM Right Eval  Right 12/15/2023 Left eval  Shoulder flexion [facility]  [facility]  Shoulder abduction [facility]  [facility]  Shoulder adduction     Shoulder extension     Shoulder internal rotation     Shoulder external rotation     Elbow flexion [facility]  WFL  Elbow extension [facility]  WFL  Wrist flexion 74(74)  86(86)  Wrist extension 64(68) 22(comfort level), 44 (52 passively) 64(66)  Wrist ulnar deviation 32(34)  38(38)  Wrist radial deviation 24(26)  24(26)  Wrist pronation     Wrist supination     (Blank rows = not tested)  UPPER EXTREMITY MMT:     MMT Right Eval  Left Eval   Shoulder flexion 5/5 5/5  Shoulder abduction 5/5 5/5  Shoulder adduction    Shoulder extension    Shoulder internal rotation    Shoulder external rotation    Middle trapezius    Lower trapezius    Elbow flexion 5/5 5/5  Elbow extension 5/5 5/5  Wrist flexion 4/5 5/5  Wrist extension 4/5 5/5  Wrist ulnar deviation    Wrist radial deviation    Wrist pronation    Wrist supination    (Blank rows = not tested)  HAND FUNCTION: Grip strength: Right: 10 lbs; Left: 25 lbs, Lateral pinch: Right: 8 lbs, Left: 10 lbs, and 3 point pinch: Right: 4 lbs, Left: 7 lbs  COORDINATION: 9 Hole Peg test: Right: 27 sec; Left: 21  sec  SENSATION: Light touch: WFL  EDEMA: none  MUSCLE TONE:  Intact  COGNITION: Overall cognitive status: Within functional limits for tasks assessed   TREATMENT DATE: 12/19/23   Pt reports she is doing well.  She reports the doctor told her no gripping right now.  She will discontinue gripping of blue block.  Contrast Bath:  Contrast to the Right hand 2/10 pain, edema, and stiffness.     Manual Therapy:   Following contrast, pt was seen for manual therapy with soft tissue massage to right hand at the volar aspect of the hand medial, and lateral to the incision, the thenar eminence, hypothenar eminence, and the carpal tunnel.  Pt seen for scar massage at the incision site and encouraged to perform scar massage multiple times a day to increase tissue mobility to avoid scar adhesion.  Issued and instructed on use of CICA care for use at night to help soften, hydrate scar and promote healing.  Pt demonstrates understanding, may need further instruction on scar massage next session.  Therapeutic Ex.:   Pt. Seen for focus on AAROM/AROM/PROM R wrist extension/flexion with stabilizing R elbow on tabletop surface.  Tabletop stretches for wrist flexion/extension as well as ulnar and radial deviation.  12 reps each with therapist demonstration and cues.   Wrist extension/prayer stretch for 5 reps.  PATIENT EDUCATION: Education details: contrast bath, STM, blue resistive foam block exercises Person educated: Patient Education method: Explanation, Demonstration, and Tactile cues Education comprehension: verbalized understanding, returned demonstration, verbal cues required, tactile cues required, and needs further education  HOME EXERCISE PROGRAM: Blue Resistive Foam Block exercises: 3 point pinch, lateral pinch and gross grip.  GOALS: Goals reviewed with patient? No  SHORT TERM GOALS: Target date: 01/15/2024   Pt. will be independent with HEPs for RUE strength Baseline: Eval: No  current HEP Goal status: INITIAL  LONG TERM GOALS: Target date: 02/26/2024  To decrease pain by 2 points on the pain scale to be able to engage in ADLs/IADLs Baseline: Eval: 2/10 with 8/10 intermittent shooting pain in the right wrist/hand. Goal status: INITIAL  2.  Pt. Will  improve right grip strength by 5# to be able to open jars, and containers  more efficiently.  Baseline: Eval: Grip strength: Right: 10#, Left: 25# Goal status: INITIAL  3.  Pt. Will  improve right lateral pinch strength by 3# to be able to hold and use a toothbrush efficiently. Baseline: Eval: Lateral pinch strength: Right: 8#, Left 10 # Goal status: INITIAL  4.  Pt. Will improve right 3pt. pinch strength to be able to to be able to cut food. Baseline: Eval: 3Pt. Pinch strength: Right: 4#, L: 7# Goal status: INITIAL  5.  Pt. Will improve right hand [facility] skills by 2 sec. Of speed to be able to grasp and manage pant buttons, and zippers. Baseline: Eval: FMC skills: Right: 27 sec., Left: 21 sec.  Goal status: INITIAL  6.  Pt. Will improve MAM-20 score by 3 points to reflect improved ADL/IADL performance Baseline: Eval: MAM-20 sum score: 64/80 Goal status: INITIAL  ASSESSMENT:  CLINICAL IMPRESSION:  Pt with recent visit to MD and also diagnosed with DeQuervain's with instruction to hold off on gripping activities.  Pt had been using blue foam block for gripping, will discontinue at the moment and will reassess as pain and motion improves.  Issued and instructed on CICA care scar pad for night time to use to help soften, hydrate and promote additional healing of scar to avoid any adhesions.  Pt progressing well in all areas however she is moving and has been doing a lot of lifting items.  Discussed she will need to have her husband do these tasks and she needs to rest her wrist and hand to avoid further inflammation and issues.   Pt. continues to benefit from OT services to work on improving right hand pain, increasing  right grip, and pinch strength, and improve [facility] skills in order to be able to perform daily ADL, and IADL tasks more efficiently.  PERFORMANCE DEFICITS: in functional skills including ADLs, IADLs, coordination, dexterity, ROM, strength, pain, Fine motor control, Gross motor control, and UE functional use, cognitive skills including , and psychosocial skills including environmental adaptation and routines and behaviors.   IMPAIRMENTS: are limiting patient from ADLs, IADLs, and leisure.   CO-MORBIDITIES: may have co-morbidities  that affects occupational performance. Patient will benefit from skilled OT to address above impairments and improve overall function.  MODIFICATION OR ASSISTANCE TO COMPLETE EVALUATION: Min-Moderate modification of tasks or assist with assess necessary to complete an evaluation.  OT OCCUPATIONAL PROFILE AND HISTORY: Detailed assessment: Review of records and additional review of physical, cognitive, psychosocial history related to current functional performance.  CLINICAL DECISION MAKING: Moderate - several treatment options, min-mod task modification necessary  REHAB POTENTIAL: Good  EVALUATION COMPLEXITY: Moderate    PLAN:  OT FREQUENCY: 2x/week  OT DURATION: 12 weeks  PLANNED INTERVENTIONS: 97535 self care/ADL training, 02889 therapeutic exercise, 97530 therapeutic activity, 97112 neuromuscular re-education, 97140 manual therapy, 97035 ultrasound, 97018 paraffin, 02989 moist heat, 97010 cryotherapy, 97034 contrast bath, 97033 iontophoresis, 97760 Orthotic Initial, 97763 Orthotic/Prosthetic subsequent, scar mobilization, patient/family education, and DME and/or AE instructions  RECOMMENDED OTHER SERVICES: None  CONSULTED AND AGREED WITH PLAN OF CARE: Patient  PLAN FOR NEXT SESSION: Treatment  Modene Andy T Arah Aro, OTR/L, CLT  12/20/2023, 7:58 PM

## 2023-12-22 ENCOUNTER — Emergency Department

## 2023-12-22 ENCOUNTER — Other Ambulatory Visit: Payer: Self-pay

## 2023-12-22 ENCOUNTER — Observation Stay
Admission: EM | Admit: 2023-12-22 | Discharge: 2023-12-23 | Disposition: A | Discharge status: Home / Self Care | Attending: Internal Medicine | Admitting: Internal Medicine

## 2023-12-22 DIAGNOSIS — R42 Dizziness and giddiness: Secondary | ICD-10-CM | POA: Diagnosis not present

## 2023-12-22 DIAGNOSIS — Z8679 Personal history of other diseases of the circulatory system: Secondary | ICD-10-CM | POA: Diagnosis not present

## 2023-12-22 DIAGNOSIS — E785 Hyperlipidemia, unspecified: Secondary | ICD-10-CM | POA: Insufficient documentation

## 2023-12-22 DIAGNOSIS — F32A Depression, unspecified: Secondary | ICD-10-CM | POA: Diagnosis not present

## 2023-12-22 DIAGNOSIS — F419 Anxiety disorder, unspecified: Secondary | ICD-10-CM | POA: Diagnosis not present

## 2023-12-22 DIAGNOSIS — R079 Chest pain, unspecified: Principal | ICD-10-CM | POA: Diagnosis present

## 2023-12-22 DIAGNOSIS — Z7901 Long term (current) use of anticoagulants: Secondary | ICD-10-CM | POA: Diagnosis not present

## 2023-12-22 DIAGNOSIS — E039 Hypothyroidism, unspecified: Secondary | ICD-10-CM | POA: Diagnosis not present

## 2023-12-22 HISTORY — DX: Heart failure, unspecified: I50.9

## 2023-12-22 LAB — BASIC METABOLIC PANEL WITH GFR
Anion gap: 11 (ref 5–15)
BUN: 21 mg/dL (ref 8–23)
CO2: 22 mmol/L (ref 22–32)
Calcium: 9 mg/dL (ref 8.9–10.3)
Chloride: 109 mmol/L (ref 98–111)
Creatinine, Ser: 0.78 mg/dL (ref 0.44–1.00)
GFR, Estimated: >60 mL/min (ref >60)
Glucose, Bld: 117 mg/dL — ABNORMAL HIGH (ref 70–99)
Potassium: 3.4 mmol/L — ABNORMAL LOW (ref 3.5–5.1)
Sodium: 142 mmol/L (ref 135–145)

## 2023-12-22 LAB — CBC
HCT: 44.4 % (ref 36.0–46.0)
Hemoglobin: 15 g/dL (ref 12.0–15.0)
MCH: 32.7 pg (ref 26.0–34.0)
MCHC: 33.8 g/dL (ref 30.0–36.0)
MCV: 96.7 fL (ref 80.0–100.0)
Platelets: 284 K/uL (ref 150–400)
RBC: 4.59 MIL/uL (ref 3.87–5.11)
RDW: 13.4 % (ref 11.5–15.5)
WBC: 7.7 K/uL (ref 4.0–10.5)
nRBC: 0 % (ref 0.0–0.2)

## 2023-12-22 LAB — TROPONIN I (HIGH SENSITIVITY): Troponin I (High Sensitivity): 5 ng/L (ref <18)

## 2023-12-22 MED ORDER — ISOSORBIDE MONONITRATE ER 60 MG PO TB24
60.0000 mg | ORAL_TABLET | Freq: Every day | ORAL | Status: DC
  Administered 2023-12-23: 60 mg via ORAL
  Filled 2023-12-22: qty 1

## 2023-12-22 MED ORDER — SODIUM CHLORIDE 0.9 % IV SOLN: INTRAVENOUS | Status: DC

## 2023-12-22 MED ORDER — AZELASTINE HCL 0.1 % NA SOLN
2.0000 | Freq: Two times a day (BID) | NASAL | Status: DC
  Filled 2023-12-22: qty 30

## 2023-12-22 MED ORDER — LEVOTHYROXINE SODIUM 50 MCG PO TABS
75.0000 ug | ORAL_TABLET | Freq: Every day | ORAL | Status: DC
  Administered 2023-12-23: 75 ug via ORAL
  Filled 2023-12-22: qty 1

## 2023-12-22 MED ORDER — TRAZODONE HCL 50 MG PO TABS
50.0000 mg | ORAL_TABLET | Freq: Every day | ORAL | Status: DC
  Administered 2023-12-22: 100 mg via ORAL
  Filled 2023-12-22: qty 2

## 2023-12-22 MED ORDER — TRAZODONE HCL 50 MG PO TABS: 25.0000 mg | ORAL_TABLET | Freq: Every evening | ORAL | Status: DC | PRN

## 2023-12-22 MED ORDER — ALPRAZOLAM 0.25 MG PO TABS: 0.2500 mg | ORAL_TABLET | Freq: Two times a day (BID) | ORAL | Status: DC | PRN

## 2023-12-22 MED ORDER — BUSPIRONE HCL 5 MG PO TABS
15.0000 mg | ORAL_TABLET | Freq: Two times a day (BID) | ORAL | Status: DC
  Administered 2023-12-22 – 2023-12-23 (×2): 15 mg via ORAL
  Filled 2023-12-22 (×2): qty 3

## 2023-12-22 MED ORDER — ACETAMINOPHEN 325 MG PO TABS
650.0000 mg | ORAL_TABLET | ORAL | Status: DC | PRN
  Administered 2023-12-23: 650 mg via ORAL
  Filled 2023-12-22: qty 2

## 2023-12-22 MED ORDER — MAGNESIUM HYDROXIDE 400 MG/5ML PO SUSP: 30.0000 mL | Freq: Every day | ORAL | Status: DC | PRN

## 2023-12-22 MED ORDER — POLYETHYLENE GLYCOL 3350 17 GM/SCOOP PO POWD: 119.0000 g | ORAL | Status: DC | PRN

## 2023-12-22 MED ORDER — VITAMIN D 25 MCG (1000 UNIT) PO TABS
1000.0000 [IU] | ORAL_TABLET | Freq: Every day | ORAL | Status: DC
  Administered 2023-12-23: 1000 [IU] via ORAL
  Filled 2023-12-22: qty 1

## 2023-12-22 MED ORDER — ENOXAPARIN SODIUM 40 MG/0.4ML IJ SOSY
40.0000 mg | PREFILLED_SYRINGE | INTRAMUSCULAR | Status: DC
  Administered 2023-12-22: 40 mg via SUBCUTANEOUS
  Filled 2023-12-22: qty 0.4

## 2023-12-22 MED ORDER — ASPIRIN 81 MG PO TBEC
81.0000 mg | DELAYED_RELEASE_TABLET | Freq: Every day | ORAL | Status: DC
  Administered 2023-12-23: 81 mg via ORAL
  Filled 2023-12-22: qty 1

## 2023-12-22 MED ORDER — POLYETHYLENE GLYCOL 3350 17 G PO PACK: 17.0000 g | PACK | Freq: Every day | ORAL | Status: DC | PRN

## 2023-12-22 MED ORDER — EMPAGLIFLOZIN 10 MG PO TABS
10.0000 mg | ORAL_TABLET | Freq: Every day | ORAL | Status: DC
  Administered 2023-12-23: 10 mg via ORAL
  Filled 2023-12-22: qty 1

## 2023-12-22 MED ORDER — EZETIMIBE 10 MG PO TABS
10.0000 mg | ORAL_TABLET | Freq: Every day | ORAL | Status: DC
  Administered 2023-12-23: 10 mg via ORAL
  Filled 2023-12-22: qty 1

## 2023-12-22 MED ORDER — NITROGLYCERIN 0.4 MG SL SUBL: 0.4000 mg | SUBLINGUAL_TABLET | SUBLINGUAL | Status: DC | PRN

## 2023-12-22 MED ORDER — BUPROPION HCL ER (SR) 100 MG PO TB12
100.0000 mg | ORAL_TABLET | Freq: Every morning | ORAL | Status: DC
  Administered 2023-12-23: 100 mg via ORAL
  Filled 2023-12-22: qty 1

## 2023-12-22 MED ORDER — BISOPROLOL FUMARATE 5 MG PO TABS
2.5000 mg | ORAL_TABLET | Freq: Every evening | ORAL | Status: DC
  Filled 2023-12-22: qty 0.5

## 2023-12-22 MED ORDER — DULOXETINE HCL 60 MG PO CPEP
60.0000 mg | ORAL_CAPSULE | Freq: Every day | ORAL | Status: DC
  Administered 2023-12-23: 60 mg via ORAL
  Filled 2023-12-22: qty 1

## 2023-12-22 MED ORDER — ONDANSETRON HCL 4 MG/2ML IJ SOLN: 4.0000 mg | Freq: Four times a day (QID) | INTRAMUSCULAR | Status: DC | PRN

## 2023-12-22 MED ORDER — ATORVASTATIN CALCIUM 20 MG PO TABS
80.0000 mg | ORAL_TABLET | Freq: Every day | ORAL | Status: DC
  Administered 2023-12-22: 80 mg via ORAL
  Filled 2023-12-22: qty 4

## 2023-12-22 NOTE — ED Notes (Signed)
 CCMD called to initiate cardiac monitoring

## 2023-12-22 NOTE — H&P (Signed)
 [place]   PATIENT NAME: Lori Wagner    MR#:  [id number]  DATE OF BIRTH:  03-31-1951  DATE OF ADMISSION:  12/22/2023  PRIMARY CARE PHYSICIAN: Valora Agent, MD   Patient is coming from: Home   REQUESTING/REFERRING PHYSICIAN: Jossie Birmingham, MD  CHIEF COMPLAINT:   Chief Complaint  Patient presents with   Chest Pain   Dizziness    HISTORY OF PRESENT ILLNESS:  Lori Wagner is a 73 y.o. female with medical history significant for anxiety, osteoarthritis, CHF, chronic kidney disease, coronary artery disease, essential hypertension, rheumatoid arthritis, and SVT, who presented to the emergency room with acute onset of recurrent midsternal chest pain felt as pressure and graded 5/10 in severity with associated dyspnea and palpitations without nausea or vomiting or diaphoresis.  She denied any radiation of her pain.  No leg pain or edema no recent travels or surgeries.  No fever or chills.  No cough or wheezing or hemoptysis.  No dysuria, oliguria or hematuria or flank pain.  Her chest pain has been associated with lightheadedness.  She was advised to come to the ED at her cardiologist office.  [place].  ED Course: When she came to the ER, vital signs were within normal.  Labs revealed mild hypokalemia 3.4 and CBC was normal.  High-sensitivity troponin was 506. EKG as reviewed by me : EG showed normal sinus rhythm with rate of 83 with right bundle branch block.  Heart Imaging: 2 view chest x-ray showed no acute cardiopulmonary disease.  The patient will be admitted to an observation cardiac telemetry bed for further evaluation and management. PAST MEDICAL HISTORY:   Past Medical History:  Diagnosis Date   Actinic keratosis    Anxiety    Arthritis    knees,hip , back and hands   CHF (congestive heart failure) (HCC)    Chronic kidney disease    Hx of kidney stones, years ago   Coronary artery disease    Dysrhythmia    SVT- controlled with Beta blocker    Family history of bone cancer    Family history of lung cancer    Family history of ovarian cancer    Fatigue    HOH (hard of hearing)    tinnitis both ears   Hypertension     can cause lightheadedness   Motion sickness    Neuromuscular disorder (HCC)    carpal tunnel both hands   PONV (postoperative nausea and vomiting)    Post herpetic neuralgia    left side of face   RA (rheumatoid arthritis) (HCC)    Shingles 02/2017   left side of face   Sleep apnea    Hx of/ when overweight, no CPAP   SVT (supraventricular tachycardia) (HCC)    Thyroid  disease    HX OF/ NO LONGER TAKING THYROID  MEDS, THYROID  TESTS CAME BACK NORMAL   Vertigo    Hx of    PAST SURGICAL HISTORY:   Past Surgical History:  Procedure Laterality Date   ANKLE SURGERY Left    APPENDECTOMY     BLADDER REPAIR     CARDIAC CATHETERIZATION     CARDIAC SURGERY     CATARACT EXTRACTION W/PHACO Left 06/03/2017   Procedure: CATARACT EXTRACTION PHACO AND INTRAOCULAR LENS PLACEMENT (IOC)  LEFT;  Surgeon: Myrna Adine Anes, MD;  Location: [facility];  Service: Ophthalmology;  Laterality: Left;   CATARACT EXTRACTION W/PHACO Right 07/08/2017   Procedure: CATARACT EXTRACTION PHACO AND INTRAOCULAR LENS PLACEMENT (IOC)  RIGHT;  Surgeon: Myrna Adine Anes, MD;  Location: [facility];  Service: Ophthalmology;  Laterality: Right;   COLONOSCOPY N/A 01/08/2017   Procedure: COLONOSCOPY;  Surgeon: Unk Corinn Skiff, MD;  Location: [facility];  Service: Gastroenterology;  Laterality: N/A;   CORONARY ARTERY BYPASS GRAFT  2001   x4   GASTRIC BYPASS     SLEEVE   HAND SURGERY Right    2 TRIGGER FINGERS   KNEE ARTHROSCOPY Right    renal colic     TRIGGER FINGER RELEASE Right 01/17/2016   Procedure: RELEASE TRIGGER FINGER/A-1 PULLEY THUMB;  Surgeon: Norleen JINNY Maltos, MD;  Location: [facility];  Service: Orthopedics;  Laterality: Right;    SOCIAL HISTORY:   Social History   Tobacco Use   Smoking  status: Never   Smokeless tobacco: Never  Substance Use Topics   Alcohol use: No    FAMILY HISTORY:   Family History  Problem Relation Age of Onset   Hypertension Mother    Coronary artery disease Mother    Arthritis Mother    Osteoporosis Mother    Depression Mother    Anxiety disorder Mother    Coronary artery disease Father    Stroke Father    Coronary artery disease Brother    Bipolar disorder Brother    Drug abuse Brother    Coronary artery disease Maternal Aunt    Osteoporosis Maternal Aunt    Coronary artery disease Cousin    Osteoporosis Cousin    Anxiety disorder Daughter    Depression Daughter    Bone cancer Paternal Uncle    Lung cancer Paternal Grandmother    Ovarian cancer Cousin 30       CHEK2+   Breast cancer Neg Hx     DRUG ALLERGIES:   Allergies  Allergen Reactions   Epinephrine  Other (See Comments)    Other Reaction: Other reaction-may go into SVT Pt states she wants this taken out.   Sulfa Antibiotics Hives and Rash    Other Reaction: Other reaction   Ace Inhibitors Cough   Morphine  And Codeine Nausea And Vomiting   Penicillins Hives   Cynara Scolymus ([place]ich[place]) Hives    REVIEW OF SYSTEMS:   ROS As per history of present illness. All pertinent systems were reviewed above. Constitutional, HEENT, cardiovascular, respiratory, GI, GU, musculoskeletal, neuro, psychiatric, endocrine, integumentary and hematologic systems were reviewed and are otherwise negative/unremarkable except for positive findings mentioned above in the HPI.   MEDICATIONS AT HOME:   Prior to Admission medications   Medication Sig Start Date End Date Taking? Authorizing Provider  acetaminophen  (TYLENOL ) 500 MG tablet Take 500-1,000 mg by mouth every 6 (six) hours as needed for mild pain, moderate pain or fever.   Yes [provider]  aspirin  81 MG EC tablet Take by mouth. 04/25/06  Yes [provider]  atorvastatin  (LIPITOR) 80 MG tablet Take 80 mg by  mouth at bedtime.    Yes [provider]  azelastine  (ASTELIN ) 0.1 % nasal spray Place 2 sprays into both nostrils 2 (two) times daily. 07/26/23  Yes [provider]  bisoprolol  (ZEBETA ) 5 MG tablet Take 2.5 mg by mouth every evening.    Yes [provider]  buPROPion  ER (WELLBUTRIN  SR) 100 MG 12 hr tablet Take 100 mg by mouth every morning. 10/10/21  Yes [provider]  busPIRone  (BUSPAR ) 15 MG tablet Take 1 tablet by mouth 2 (two) times daily. 11/02/20  Yes [provider]  DULoxetine  (CYMBALTA )  60 MG capsule Take 60 mg by mouth. 12/31/21  Yes [provider]  ezetimibe  (ZETIA ) 10 MG tablet Take 1 tablet by mouth daily. 07/03/23  Yes [provider]  isosorbide  mononitrate (IMDUR ) 60 MG 24 hr tablet Take 60 mg by mouth daily.    Yes [provider]  JARDIANCE  10 MG TABS tablet Take 10 mg by mouth daily.   Yes [provider]  levothyroxine  (SYNTHROID ) 75 MCG tablet Take by mouth. 06/21/21  Yes [provider]  nitroGLYCERIN  (NITROSTAT ) 0.4 MG SL tablet Place 0.4 mg under the tongue every 5 (five) minutes as needed for chest pain.   Yes [provider]  traZODone  (DESYREL ) 50 MG tablet Take 1-2 tablets (50-100 mg total) by mouth at bedtime. 11/25/19  Yes Hongalgi, Trenda BIRCH, MD  cholecalciferol  (VITAMIN D3) 25 MCG (1000 UNIT) tablet Take 1,000 Units by mouth daily.    [provider]  polyethylene glycol powder (GLYCOLAX /MIRALAX ) 17 GM/SCOOP powder Take 119 g by mouth as needed.    [provider]      VITAL SIGNS:  Blood pressure 116/61, pulse 75, temperature 98.5 F (36.9 C), temperature source Oral, resp. rate 16, height 5' 5 (1.651 m), weight 71.2 kg, SpO2 93%.  PHYSICAL EXAMINATION:  Physical Exam  GENERAL:  73 y.o.-year-old occasion female patient lying in the bed with no acute distress.  EYES: Pupils equal, round, reactive to light and accommodation. No scleral icterus.  Extraocular muscles intact.  HEENT: Head atraumatic, normocephalic. Oropharynx and nasopharynx clear.  NECK:  Supple, no jugular venous distention. No thyroid  enlargement, no tenderness.  LUNGS: Normal breath sounds bilaterally, no wheezing, rales,rhonchi or crepitation. No use of accessory muscles of respiration.  CARDIOVASCULAR: Regular rate and rhythm, S1, S2 normal. No murmurs, rubs, or gallops.  ABDOMEN: Soft, nondistended, nontender. Bowel sounds present. No organomegaly or mass.  EXTREMITIES: No pedal edema, cyanosis, or clubbing.  NEUROLOGIC: Cranial nerves II through XII are intact. Muscle strength 5/5 in all extremities. Sensation intact. Gait not checked.  PSYCHIATRIC: The patient is alert and oriented x 3.  Normal affect and good eye contact. SKIN: No obvious rash, lesion, or ulcer.   LABORATORY PANEL:   CBC Recent Labs  Lab 12/22/23 1817  WBC 7.7  HGB 15.0  HCT 44.4  PLT 284   ------------------------------------------------------------------------------------------------------------------  Chemistries  Recent Labs  Lab 12/22/23 1817  NA 142  K 3.4*  CL 109  CO2 22  GLUCOSE 117*  BUN 21  CREATININE 0.78  CALCIUM  9.0   ------------------------------------------------------------------------------------------------------------------  Cardiac Enzymes No results for input(s): TROPONINI in the last 168 hours. ------------------------------------------------------------------------------------------------------------------  RADIOLOGY:  DG Chest 2 View Result Date: 12/22/2023 CLINICAL DATA:  Chest pain EXAM: CHEST - 2 VIEW COMPARISON:  Chest x-ray 11/24/2019 FINDINGS: Sternotomy wires are again seen. The heart size and mediastinal contours are within normal limits. Both lungs are clear. The visualized skeletal structures are unremarkable. IMPRESSION: No active cardiopulmonary disease. Electronically Signed   By: Greig Pique M.D.   On: 12/22/2023 18:59       IMPRESSION AND PLAN:  Assessment and Plan: * Chest pain - The patient will be admitted to an observation cardiac telemetry bed. - Will follow serial troponins and EKGs. - The patient will be placed on aspirin  as well as p.r.n. sublingual nitroglycerin  and morphine  sulfate for pain. - Will continue beta-blocker therapy as well as statin therapy and Imdur . - We will obtain a cardiology consult in a.m. for further cardiac risk stratification. -  I notified Dr. Wilburn about the patient    Hypothyroidism - Will continue Synthroid .  Anxiety and depression - Will continue BuSpar , Wellbutrin  SR and Cymbalta .  Dyslipidemia - Will continue statin therapy and Zetia .    DVT prophylaxis: Lovenox . Advanced Care Planning:  Code Status: full code. Family Communication:  The plan of care was discussed in details with the patient (and family). I answered all questions. The patient agreed to proceed with the above mentioned plan. Further management will depend upon hospital course. Disposition Plan: Back to previous home environment Consults called: Cardiology All the records are reviewed and case discussed with ED provider.  Status is: Observation  I certify that at the time of admission, it is my clinical judgment that the patient will require hospital care extending less than 2 midnights.                            Dispo: The patient is from: Home              Anticipated d/c is to: Home              Patient currently is not medically stable to d/c.              Difficult to place patient: No  Madison DELENA Peaches M.D on 12/23/2023 at 2:12 AM  [facility]   From 7 PM-7 AM, contact night-coverage [email or web address]  CC: Primary care physician; Valora Agent, MD

## 2023-12-22 NOTE — ED Provider Notes (Signed)
 [facility] Provider Note   Event Date/Time   First MD Initiated Contact with Patient 12/22/23 [phone]     (approximate) History  Chest Pain and Dizziness  HPI Lori Wagner is a 73 y.o. female with a past medical history of CHF, CAD, CKD, thyroid  disease, anxiety/depression who presents at the request of her cardiologist after 2 weeks of orthostatic lightheadedness as well as intermittent substernal chest pain.  Patient also states she is having intermittent episodes of tachycardia.  Patient is concerned that this tachycardia is being caused by her being taken off of her beta-blockers recently.  Patient currently complains of 2/10 central chest tightness that radiates to her upper back ROS: Patient currently denies any vision changes, tinnitus, difficulty speaking, facial droop, sore throat, shortness of breath, abdominal pain, nausea/vomiting/diarrhea, dysuria, or weakness/numbness/paresthesias in any extremity   Physical Exam  Triage Vital Signs: ED Triage Vitals  Encounter Vitals Group     BP 12/22/23 1813 110/66     Girls Systolic BP Percentile --      Girls Diastolic BP Percentile --      Boys Systolic BP Percentile --      Boys Diastolic BP Percentile --      Pulse Rate 12/22/23 1818 86     Resp 12/22/23 1813 20     Temp 12/22/23 1818 98.5 F (36.9 C)     Temp Source 12/22/23 1818 Oral     SpO2 12/22/23 1813 94 %     Weight 12/22/23 1815 157 lb (71.2 kg)     Height 12/22/23 1815 5' 5 (1.651 m)     Head Circumference --      Peak Flow --      Pain Score 12/22/23 1813 2     Pain Loc --      Pain Education --      Exclude from Growth Chart --    Most recent vital signs: Vitals:   12/22/23 2034 12/22/23 2300  BP:    Pulse:    Resp:    Temp:  98.5 F (36.9 C)  SpO2: 96%    General: Awake, oriented x4. CV:  Good peripheral perfusion. Resp:  Normal effort. Abd:  No distention. Other:  Elderly overweight Caucasian female resting  comfortably in no acute distress ED Results / Procedures / Treatments  Labs (all labs ordered are listed, but only abnormal results are displayed) Labs Reviewed  BASIC METABOLIC PANEL WITH GFR - Abnormal; Notable for the following components:      Result Value   Potassium 3.4 (*)    Glucose, Bld 117 (*)    All other components within normal limits  CBC  LIPID PANEL  TROPONIN I (HIGH SENSITIVITY)  TROPONIN I (HIGH SENSITIVITY)   EKG ED ECG REPORT I, Artist MARLA Kerns, the attending physician, personally viewed and interpreted this ECG. Date: 12/22/2023 EKG Time: 1814 Rate: 83 Rhythm: normal sinus rhythm QRS Axis: normal Intervals: Right bundle branch block ST/T Wave abnormalities: normal Narrative Interpretation: Normal sinus rhythm with RBBB.  No evidence of acute ischemia RADIOLOGY ED MD interpretation: 2 view chest x-ray interpreted by me shows no evidence of acute abnormalities including no pneumonia, pneumothorax, or widened mediastinum - All radiology independently interpreted and agree with radiology assessment Official radiology report(s): DG Chest 2 View Result Date: 12/22/2023 CLINICAL DATA:  Chest pain EXAM: CHEST - 2 VIEW COMPARISON:  Chest x-ray 11/24/2019 FINDINGS: Sternotomy wires are again seen. The heart size and mediastinal contours are within  normal limits. Both lungs are clear. The visualized skeletal structures are unremarkable. IMPRESSION: No active cardiopulmonary disease. Electronically Signed   By: Greig Pique M.D.   On: 12/22/2023 18:59   PROCEDURES: Critical Care performed: No Procedures MEDICATIONS ORDERED IN ED: Medications  aspirin  EC tablet 81 mg (has no administration in time range)  bisoprolol  (ZEBETA ) tablet 2.5 mg (has no administration in time range)  atorvastatin  (LIPITOR) tablet 80 mg (80 mg Oral Given 12/22/23 2259)  ezetimibe  (ZETIA ) tablet 10 mg (has no administration in time range)  isosorbide  mononitrate (IMDUR ) 24 hr tablet 60 mg (has  no administration in time range)  nitroGLYCERIN  (NITROSTAT ) SL tablet 0.4 mg (has no administration in time range)  buPROPion  ER (WELLBUTRIN  SR) 12 hr tablet 100 mg (has no administration in time range)  busPIRone  (BUSPAR ) tablet 15 mg (15 mg Oral Given 12/22/23 2259)  DULoxetine  (CYMBALTA ) DR capsule 60 mg (has no administration in time range)  traZODone  (DESYREL ) tablet 50-100 mg (100 mg Oral Given 12/22/23 2259)  empagliflozin  (JARDIANCE ) tablet 10 mg (has no administration in time range)  levothyroxine  (SYNTHROID ) tablet 75 mcg (has no administration in time range)  azelastine  (ASTELIN ) 0.1 % nasal spray 2 spray (has no administration in time range)  cholecalciferol  (VITAMIN D3) 25 MCG (1000 UNIT) tablet 1,000 Units (has no administration in time range)  acetaminophen  (TYLENOL ) tablet 650 mg (has no administration in time range)  ondansetron  (ZOFRAN ) injection 4 mg (has no administration in time range)  enoxaparin  (LOVENOX ) injection 40 mg (40 mg Subcutaneous Given 12/22/23 2259)  0.9 %  sodium chloride  infusion ( Intravenous New Bag/Given 12/22/23 2317)  ALPRAZolam  (XANAX ) tablet 0.25 mg (has no administration in time range)  traZODone  (DESYREL ) tablet 25 mg (has no administration in time range)  magnesium  hydroxide (MILK OF MAGNESIA) suspension 30 mL (has no administration in time range)  polyethylene glycol (MIRALAX  / GLYCOLAX ) packet 17 g (has no administration in time range)   IMPRESSION / MDM / ASSESSMENT AND PLAN / ED COURSE  I reviewed the triage vital signs and the nursing notes.                             The patient is on the cardiac monitor to evaluate for evidence of arrhythmia and/or significant heart rate changes. Patient's presentation is most consistent with acute presentation with potential threat to life or bodily function. Workup: ECG, CXR, CBC, BMP, Troponin Findings: ECG: No overt evidence of STEMI. No evidence of Brugada's sign, delta wave, epsilon wave,  significantly prolonged QTc, or malignant arrhythmia HS Troponin: Negative x1 Other Labs unremarkable for emergent problems. CXR: Without PTX, PNA, or widened mediastinum HEART Score: 5  Given History, Exam, and Workup I have low suspicion for ACS, Pneumothorax, Pneumonia, Pulmonary Embolus, Tamponade, Aortic Dissection or other emergent problem as a cause for this presentation.   High Risk Chest Pain Patient at increased risk for Major Adverse Cardiac Event (AMI, PCI, CABG, death) Interventions: ASA 324mg  Defer Heparin  drip as patient pain free at this time,   Disposition: Admit for continued cardiac monitoring and trending of troponins as well as further evaluation for potential inpatient stress testing vs cardiac catheterization and coronary angiography.   FINAL CLINICAL IMPRESSION(S) / ED DIAGNOSES   Final diagnoses:  Chest pain, unspecified type  Orthostatic lightheadedness   Rx / DC Orders   ED Discharge Orders     None      Note:  This  document was prepared using Conservation officer, historic buildings and may include unintentional dictation errors.   Jossie Artist POUR, MD 12/22/23 [phone]

## 2023-12-22 NOTE — ED Triage Notes (Signed)
 From home [facility] for mid chest tightness today and off and on this week. Has taken NTG 2 other times this week besides today.   Cardiologist recommending echo and stress test. Pt has also been experiencing orthostatic hypotension ongoing since about 2 weeks, states has been getting worse and cannot do ADLs and stand up because she gets lightheaded and clammy. States also having episodes of tachycardia this week. Was taken off BP beta blocker by cardiologist and then began having episodes of tachycardia this week  Hx CABG. Takes 81mg  aspirin , no other thinners  EMS VS: 125/86, 77 HR, 95% RA, 105 CBG  Took 1 NTG, 324mg  aspirin  prior to EMS arrival  Is lightheaded, still complains of CP. RBBB on EMS EKG  Pain currently 2/10, central tightness, radiates to upper back.  Skin is dry. Respirations unlabored.

## 2023-12-23 ENCOUNTER — Ambulatory Visit: Admitting: Occupational Therapy

## 2023-12-23 ENCOUNTER — Observation Stay

## 2023-12-23 DIAGNOSIS — E785 Hyperlipidemia, unspecified: Secondary | ICD-10-CM | POA: Diagnosis not present

## 2023-12-23 DIAGNOSIS — E039 Hypothyroidism, unspecified: Secondary | ICD-10-CM | POA: Insufficient documentation

## 2023-12-23 DIAGNOSIS — F419 Anxiety disorder, unspecified: Secondary | ICD-10-CM | POA: Insufficient documentation

## 2023-12-23 DIAGNOSIS — R079 Chest pain, unspecified: Secondary | ICD-10-CM | POA: Diagnosis not present

## 2023-12-23 DIAGNOSIS — R42 Dizziness and giddiness: Secondary | ICD-10-CM | POA: Diagnosis not present

## 2023-12-23 LAB — LIPID PANEL
Cholesterol: 102 mg/dL (ref 0–200)
HDL: 52 mg/dL (ref >40)
LDL Cholesterol: 36 mg/dL (ref 0–99)
Total CHOL/HDL Ratio: 2 ratio
Triglycerides: 69 mg/dL (ref <150)
VLDL: 14 mg/dL (ref 0–40)

## 2023-12-23 LAB — NM MYOCAR MULTI W/SPECT W/WALL MOTION / EF
Base ST Depression (mm): 0 mm
Estimated workload: 1
Exercise duration (min): 1 min
Exercise duration (sec): 0 s
LV dias vol: 61 mL (ref 46–106)
LV sys vol: 19 mL (ref 3.8–5.2)
MPHR: 149 {beats}/min
Nuc Stress EF: 69 %
Peak HR: 92 {beats}/min
Percent HR: 61 %
Rest HR: 67 {beats}/min
Rest Nuclear Isotope Dose: 10.7 mCi
SDS: 0
SRS: 0
SSS: 0
ST Depression (mm): 0 mm
Stress Nuclear Isotope Dose: 31 mCi
TID: 1

## 2023-12-23 LAB — TROPONIN I (HIGH SENSITIVITY): Troponin I (High Sensitivity): 6 ng/L (ref <18)

## 2023-12-23 MED ORDER — RANOLAZINE ER 500 MG PO TB12
500.0000 mg | ORAL_TABLET | Freq: Two times a day (BID) | ORAL | 1 refills | Status: AC
Start: 2023-12-23 — End: ?

## 2023-12-23 MED ORDER — TECHNETIUM TC 99M TETROFOSMIN IV KIT
10.7400 | PACK | Freq: Once | INTRAVENOUS | Status: AC | PRN
  Administered 2023-12-23: 10.74 via INTRAVENOUS

## 2023-12-23 MED ORDER — TECHNETIUM TC 99M TETROFOSMIN IV KIT
31.0400 | PACK | Freq: Once | INTRAVENOUS | Status: AC | PRN
  Administered 2023-12-23: 31.04 via INTRAVENOUS

## 2023-12-23 MED ORDER — ISOSORBIDE MONONITRATE ER 60 MG PO TB24: 30.0000 mg | ORAL_TABLET | Freq: Every day | ORAL | Status: DC

## 2023-12-23 MED ORDER — ISOSORBIDE MONONITRATE ER 30 MG PO TB24: 30.0000 mg | ORAL_TABLET | Freq: Every day | ORAL | 1 refills | Status: AC

## 2023-12-23 MED ORDER — RANOLAZINE ER 500 MG PO TB12
500.0000 mg | ORAL_TABLET | Freq: Two times a day (BID) | ORAL | Status: DC
  Administered 2023-12-23: 500 mg via ORAL
  Filled 2023-12-23 (×2): qty 1

## 2023-12-23 MED ORDER — REGADENOSON 0.4 MG/5ML IV SOLN
0.4000 mg | Freq: Once | INTRAVENOUS | Status: AC
  Administered 2023-12-23: 0.4 mg via INTRAVENOUS

## 2023-12-23 MED ORDER — MORPHINE SULFATE (PF) 2 MG/ML IV SOLN: 2.0000 mg | INTRAVENOUS | Status: DC | PRN

## 2023-12-23 NOTE — Assessment & Plan Note (Signed)
-   Will continue Synthroid.

## 2023-12-23 NOTE — ED Notes (Signed)
 Called dietary and requested a lunch tray for pt.

## 2023-12-23 NOTE — Assessment & Plan Note (Addendum)
 Will continue statin therapy and Zetia.

## 2023-12-23 NOTE — ED Notes (Signed)
 Sent a message to pt's provider inquiring about NPO status. Pt requesting to eat.

## 2023-12-23 NOTE — ED Notes (Signed)
Pt able to ambulate to restroom independently.

## 2023-12-23 NOTE — Assessment & Plan Note (Addendum)
-   The patient will be admitted to an observation cardiac telemetry bed. - Will follow serial troponins and EKGs. - The patient will be placed on aspirin  as well as p.r.n. sublingual nitroglycerin  and morphine  sulfate for pain. - Will continue beta-blocker therapy as well as statin therapy and Imdur . - We will obtain a cardiology consult in a.m. for further cardiac risk stratification. - I notified Dr. Wilburn about the patient

## 2023-12-23 NOTE — Hospital Course (Addendum)
 Taken from H&P.  Lori Wagner is a 73 y.o. female with medical history significant for anxiety, osteoarthritis, CHF, chronic kidney disease, coronary artery disease, essential hypertension, rheumatoid arthritis, and SVT, who presented to the emergency room with acute onset of recurrent midsternal chest pain felt as pressure and graded 5/10 in severity with associated dyspnea and palpitations without nausea or vomiting or diaphoresis.   On presentation stable vitals, labs with mild hypokalemia at 3.4, troponin 5>>6. EKG with NSR, RBBB.  Chest x-ray with no acute abnormality. Cardiology was consulted.  7/29: Vital stable, lipid panel normal.  Echocardiogram was ordered but unable to obtained, cardiology cleared her for discharge and they can do an echo as outpatient. NM Myoview  was normal and low risk.  Cardiology decreased the dose of Imdur  for concern of postural dizziness and also added Ranexa .  She will continue with rest of her home medications and follow-up with her providers closely for further assistance.

## 2023-12-23 NOTE — Discharge Summary (Signed)
 Physician Discharge Summary   Patient: Lori Wagner MRN: [id number] DOB: [birth year]  Admit date:     12/22/2023  Discharge date: 12/23/23  Discharge Physician: Amaryllis Dare   PCP: Valora Agent, MD   Recommendations at discharge:  Please obtain CBC and BMP and follow-up Please arrange outpatient echocardiogram Follow-up with cardiology Follow-up with primary care provider  Discharge Diagnoses: Principal Problem:   Chest pain Active Problems:   Orthostatic lightheadedness   Dyslipidemia   Anxiety and depression   Hypothyroidism   Hospital Course: Taken from H&P.  Lori Wagner is a 73 y.o. female with medical history significant for anxiety, osteoarthritis, CHF, chronic kidney disease, coronary artery disease, essential hypertension, rheumatoid arthritis, and SVT, who presented to the emergency room with acute onset of recurrent midsternal chest pain felt as pressure and graded 5/10 in severity with associated dyspnea and palpitations without nausea or vomiting or diaphoresis.   On presentation stable vitals, labs with mild hypokalemia at 3.4, troponin 5>>6. EKG with NSR, RBBB.  Chest x-ray with no acute abnormality. Cardiology was consulted.  7/29: Vital stable, lipid panel normal.  Echocardiogram was ordered but unable to obtained, cardiology cleared her for discharge and they can do an echo as outpatient. NM Myoview  was normal and low risk.  Cardiology decreased the dose of Imdur  for concern of postural dizziness and also added Ranexa .  She will continue with rest of her home medications and follow-up with her providers closely for further assistance.  Assessment and Plan: * Chest pain EKG without any acute changes and troponin negative.  Tress test was normal and low risk. Echocardiogram to be done as outpatient by cardiology ACS ruled out. Imdur  dose was decreased to 30 mg and Ranexa  was added by cardiology  Hypothyroidism - Will continue  Synthroid .  Anxiety and depression - Will continue BuSpar , Wellbutrin  SR and Cymbalta .  Dyslipidemia - Will continue statin therapy and Zetia .  Consultants: Cardiology Procedures performed: NM Myoview  Disposition: Home Diet recommendation:  Discharge Diet Orders (From admission, onward)     Start     Ordered   12/23/23 0000  Diet - low sodium heart healthy        12/23/23 1530           Cardiac diet DISCHARGE MEDICATION: Allergies as of 12/23/2023       Reactions   Epinephrine  Other (See Comments)   Other Reaction: Other reaction-may go into SVT Pt states she wants this taken out.   Sulfa Antibiotics Hives, Rash   Other Reaction: Other reaction   Ace Inhibitors Cough   Morphine  And Codeine Nausea And Vomiting   Penicillins Hives   Cynara Scolymus (artichoke) Hives        Medication List     TAKE these medications    acetaminophen  500 MG tablet Commonly known as: TYLENOL  Take 500-1,000 mg by mouth every 6 (six) hours as needed for mild pain, moderate pain or fever.   aspirin  EC 81 MG tablet Take by mouth.   atorvastatin  80 MG tablet Commonly known as: LIPITOR Take 80 mg by mouth at bedtime.   azelastine  0.1 % nasal spray Commonly known as: ASTELIN  Place 2 sprays into both nostrils 2 (two) times daily.   bisoprolol  5 MG tablet Commonly known as: ZEBETA  Take 2.5 mg by mouth every evening.   buPROPion  ER 100 MG 12 hr tablet Commonly known as: WELLBUTRIN  SR Take 100 mg by mouth every morning.   busPIRone  15 MG tablet Commonly known as: BUSPAR  Take 1  tablet by mouth 2 (two) times daily.   cholecalciferol  25 MCG (1000 UNIT) tablet Commonly known as: VITAMIN D3 Take 1,000 Units by mouth daily.   DULoxetine  60 MG capsule Commonly known as: CYMBALTA  Take 60 mg by mouth.   ezetimibe  10 MG tablet Commonly known as: ZETIA  Take 1 tablet by mouth daily.   isosorbide  mononitrate 30 MG 24 hr tablet Commonly known as: IMDUR  Take 1 tablet (30 mg  total) by mouth daily. Start taking on: December 24, 2023 What changed:  medication strength how much to take   Jardiance  10 MG Tabs tablet Generic drug: empagliflozin  Take 10 mg by mouth daily.   levothyroxine  75 MCG tablet Commonly known as: SYNTHROID  Take by mouth.   nitroGLYCERIN  0.4 MG SL tablet Commonly known as: NITROSTAT  Place 0.4 mg under the tongue every 5 (five) minutes as needed for chest pain.   polyethylene glycol powder 17 GM/SCOOP powder Commonly known as: GLYCOLAX /MIRALAX  Take 119 g by mouth as needed.   ranolazine  500 MG 12 hr tablet Commonly known as: RANEXA  Take 1 tablet (500 mg total) by mouth 2 (two) times daily.   traZODone  50 MG tablet Commonly known as: DESYREL  Take 1-2 tablets (50-100 mg total) by mouth at bedtime.        Follow-up Information     Ezzard Mliss HERO, NP. Go in 1 week(s).   Specialty: Cardiology Contact information: [address] [place] [address] [phone]         Valora Agent, MD. Schedule an appointment as soon as possible for a visit in 1 week(s).   Specialty: Family Medicine Contact information: [address] [place] [address] [phone]                Discharge Exam: Lori Wagner   12/22/23 1815  Weight: 71.2 kg   General.  Well-developed elderly lady, in no acute distress. Pulmonary.  Lungs clear bilaterally, normal respiratory effort. CV.  Regular rate and rhythm, no JVD, rub or murmur. Abdomen.  Soft, nontender, nondistended, BS positive. CNS.  Alert and oriented .  No focal neurologic deficit. Extremities.  No edema, no cyanosis, pulses intact and symmetrical. Psychiatry.  Judgment and insight appears normal.   Condition at discharge: stable  The results of significant diagnostics from this hospitalization (including imaging, microbiology, ancillary and laboratory) are listed below for reference.   Imaging Studies: NM Myocar Multi W/Spect W/Wall  Motion / EF Result Date: 12/23/2023   The study is normal. The study is low risk.   EKG non-diagnostic due to baseline ST-T changes.   LV perfusion is normal. There is no evidence of ischemia. There is no evidence of infarction.   Left ventricular function is normal. End diastolic cavity size is normal. End systolic cavity size is normal. No TID.   DG Chest 2 View Result Date: 12/22/2023 CLINICAL DATA:  Chest pain EXAM: CHEST - 2 VIEW COMPARISON:  Chest x-ray 11/24/2019 FINDINGS: Sternotomy wires are again seen. The heart size and mediastinal contours are within normal limits. Both lungs are clear. The visualized skeletal structures are unremarkable. IMPRESSION: No active cardiopulmonary disease. Electronically Signed   By: Greig Pique M.D.   On: 12/22/2023 18:59   CT Cervical Spine Wo Contrast Result Date: 12/10/2023 CLINICAL DATA:  Status post fall. EXAM: CT CERVICAL SPINE WITHOUT CONTRAST TECHNIQUE: Multidetector CT imaging of the cervical spine was performed without intravenous contrast. Multiplanar CT image reconstructions were also generated. RADIATION DOSE REDUCTION: This  exam was performed according to the departmental dose-optimization program which includes automated exposure control, adjustment of the mA and/or kV according to patient size and/or use of iterative reconstruction technique. COMPARISON:  None Available. FINDINGS: Alignment: Approximately 1 mm retrolisthesis of the C5 vertebral body is noted on C6. Skull base and vertebrae: No acute fracture. No primary bone lesion or focal pathologic process. Soft tissues and spinal canal: No prevertebral fluid or swelling. No visible canal hematoma. Disc levels: Mild multilevel endplate sclerosis is seen throughout the cervical spine with mild to moderate severity anterior osteophyte formation and posterior bony spurring present at the levels of C3-C4, C4-C5 and C5-C6. There is moderate severity intervertebral disc space narrowing at C5-C6, with  mild intervertebral disc space narrowing present throughout the remainder of the cervical spine. Bilateral mild-to-moderate severity multilevel facet joint hypertrophy is noted. Upper chest: Negative. Other: None. IMPRESSION: 1. No acute fracture or traumatic subluxation of the cervical spine. 2. Mild to moderate severity multilevel degenerative changes, most prominent at the levels of C3-C4, C4-C5 and C5-C6. Electronically Signed   By: Suzen Dials M.D.   On: 12/10/2023 14:59   CT Head Wo Contrast Result Date: 12/10/2023 CLINICAL DATA:  Status post fall. EXAM: CT HEAD WITHOUT CONTRAST TECHNIQUE: Contiguous axial images were obtained from the base of the skull through the vertex without intravenous contrast. RADIATION DOSE REDUCTION: This exam was performed according to the departmental dose-optimization program which includes automated exposure control, adjustment of the mA and/or kV according to patient size and/or use of iterative reconstruction technique. COMPARISON:  None Available. FINDINGS: Brain: There is generalized cerebral atrophy with widening of the extra-axial spaces and ventricular dilatation. There are areas of decreased attenuation within the white matter tracts of the supratentorial brain, consistent with microvascular disease changes. Vascular: No hyperdense vessel or unexpected calcification. Skull: Normal. Negative for fracture or focal lesion. Sinuses/Orbits: No acute finding. Other: None. IMPRESSION: No acute intracranial abnormality. Electronically Signed   By: Suzen Dials M.D.   On: 12/10/2023 14:46   DG Wrist Complete Right Result Date: 12/10/2023 CLINICAL DATA:  fall EXAM: RIGHT WRIST - COMPLETE 3+ VIEW COMPARISON:  None Available. FINDINGS: No acute fracture or dislocation. There is no evidence of arthropathy or other focal bone abnormality. Soft tissues are unremarkable. IMPRESSION: No acute fracture or dislocation. Electronically Signed   By: Rogelia Myers M.D.   On:  12/10/2023 14:40    Microbiology: Results for orders placed or performed in visit on 08/29/23  CULTURE, URINE COMPREHENSIVE     Status: None   Collection Time: 08/29/23  2:04 PM   Specimen: Urine   UR  Result Value Ref Range Status   Urine Culture, Comprehensive Final report  Final   Organism ID, Bacteria Comment  Final    Comment: Mixed urogenital flora 50,000-100,000 colony forming units per mL   Microscopic Examination     Status: Abnormal   Collection Time: 08/29/23  2:04 PM   Urine  Result Value Ref Range Status   WBC, UA 0-5 0 - 5 /hpf Final   RBC, Urine 3-10 (A) 0 - 2 /hpf Final   Epithelial Cells (non renal) >10 (A) 0 - 10 /hpf Final   Casts Present (A) None seen /lpf Final   Cast Type Granular casts (A) N/A Final   Mucus, UA Present (A) Not Estab. Final   Bacteria, UA Moderate (A) None seen/Few Final    Labs: CBC: Recent Labs  Lab 12/22/23 1817  WBC 7.7  HGB  15.0  HCT 44.4  MCV 96.7  PLT 284   Basic Metabolic Panel: Recent Labs  Lab 12/22/23 1817  NA 142  K 3.4*  CL 109  CO2 22  GLUCOSE 117*  BUN 21  CREATININE 0.78  CALCIUM  9.0   Liver Function Tests: No results for input(s): AST, ALT, ALKPHOS, BILITOT, PROT, ALBUMIN in the last 168 hours. CBG: No results for input(s): GLUCAP in the last 168 hours.  Discharge time spent: greater than 30 minutes.  This record has been created using Conservation officer, historic buildings. Errors have been sought and corrected,but may not always be located. Such creation errors do not reflect on the standard of care.   Signed: Amaryllis Dare, MD [facility] 12/23/2023

## 2023-12-23 NOTE — Assessment & Plan Note (Signed)
-   Will continue BuSpar , Wellbutrin  SR and Cymbalta .

## 2023-12-23 NOTE — Consult Note (Signed)
 [facility] CLINIC CARDIOLOGY CONSULT NOTE       Patient ID: Lori Wagner MRN: [id number] DOB/AGE: March 07, 1951 73 y.o.  Admit date: 12/22/2023 Referring Physician Dr. Lawence Primary Physician Valora Agent, MD Primary Cardiologist [facility] Purnell Kerns, NP) Reason for Consultation chest pain  HPI: Lori Wagner is a 73 y.o. female  with a past medical history of coronary artery disease s/p CABG 2001 (LIMA-LAD sequential to D2, Free radial to D1 and ramus) and PCIs most recently in 2019, chronic HFpEF, hypertension, hyperlipidemia, paroxysmal supraventricular tachycardia s/p ablation 2018  who presented to the ED on 12/22/2023 for chest pain and orthostatic hypotensive symptoms. Cardiology was consulted for further evaluation.   Patient presented to the ED with chest pain. Work up in the ED notable for sodium 142, potassium 3.4, creatinine 0.78, hemoglobin 15, platelets 284.  EKG with sinus rhythm, RBBB, rate 85 bpm (unchanged from prior EKGs).  Troponins negative x 2.  Lipid panel within normal limit limits, LDL 36.  Chest x-ray without acute cardiopulmonary disease.  Patient restarted on her home cardiac medications.  And given IV fluids.  At the time of my evaluation this AM, patient was resting comfortably in ED stretcher.  We discussed patient's symptoms in further detail.  Patient states she has a history of orthostatic hypotension.  States that she has noticed she has been having worsening low blood pressure when she stands making it hard for her to get up and do things.  Patient also reports chest pressure/squeezing that occur intermittently at rest.  Patient states it does not always associated with exertion.  Patient reports within the last few weeks she has taken about 3 nitros at home.  States she recently saw her cardiologist yesterday and the plan was to perform a cardiac stress test and echocardiogram.  However patient states she was nervous to wait to have this test performed due  to her worsening orthostatic hypertension and intermittent chest pressure, so patient came to ED to get worked up.  Review of systems complete and found to be negative unless listed above    Past Medical History:  Diagnosis Date   Actinic keratosis    Anxiety    Arthritis    knees,hip , back and hands   CHF (congestive heart failure) (HCC)    Chronic kidney disease    Hx of kidney stones, years ago   Coronary artery disease    Dysrhythmia    SVT- controlled with Beta blocker   Family history of bone cancer    Family history of lung cancer    Family history of ovarian cancer    Fatigue    HOH (hard of hearing)    tinnitis both ears   Hypertension     can cause lightheadedness   Motion sickness    Neuromuscular disorder (HCC)    carpal tunnel both hands   PONV (postoperative nausea and vomiting)    Post herpetic neuralgia    left side of face   RA (rheumatoid arthritis) (HCC)    Shingles 02/2017   left side of face   Sleep apnea    Hx of/ when overweight, no CPAP   SVT (supraventricular tachycardia) (HCC)    Thyroid  disease    HX OF/ NO LONGER TAKING THYROID  MEDS, THYROID  TESTS CAME BACK NORMAL   Vertigo    Hx of    Past Surgical History:  Procedure Laterality Date   ANKLE SURGERY Left    APPENDECTOMY     BLADDER REPAIR  CARDIAC CATHETERIZATION     CARDIAC SURGERY     CATARACT EXTRACTION W/PHACO Left 06/03/2017   Procedure: CATARACT EXTRACTION PHACO AND INTRAOCULAR LENS PLACEMENT (IOC)  LEFT;  Surgeon: Myrna Adine Anes, MD;  Location: [facility];  Service: Ophthalmology;  Laterality: Left;   CATARACT EXTRACTION W/PHACO Right 07/08/2017   Procedure: CATARACT EXTRACTION PHACO AND INTRAOCULAR LENS PLACEMENT (IOC) RIGHT;  Surgeon: Myrna Adine Anes, MD;  Location: [facility];  Service: Ophthalmology;  Laterality: Right;   COLONOSCOPY N/A 01/08/2017   Procedure: COLONOSCOPY;  Surgeon: Unk Corinn Skiff, MD;  Location: [facility];  Service:  Gastroenterology;  Laterality: N/A;   CORONARY ARTERY BYPASS GRAFT  2001   x4   GASTRIC BYPASS     SLEEVE   HAND SURGERY Right    2 TRIGGER FINGERS   KNEE ARTHROSCOPY Right    renal colic     TRIGGER FINGER RELEASE Right 01/17/2016   Procedure: RELEASE TRIGGER FINGER/A-1 PULLEY THUMB;  Surgeon: Norleen JINNY Maltos, MD;  Location: [facility];  Service: Orthopedics;  Laterality: Right;    (Not in a hospital admission)  Social History   Socioeconomic History   Marital status: Married    Spouse name: Zachary   Number of children: 1   Years of education: Not on file   Highest education level: Bachelor's degree (e.g., BA, AB, BS)  Occupational History    Comment: retired  Tobacco Use   Smoking status: Never   Smokeless tobacco: Never  Vaping Use   Vaping status: Never Used  Substance and Sexual Activity   Alcohol use: No   Drug use: No   Sexual activity: Never  Other Topics Concern   Not on file  Social History Narrative   Not on file   Social Drivers of Health   Financial Resource Strain: Medium Risk (08/18/2023)   Received from [facility]   Overall Financial Resource Strain (CARDIA)    Difficulty of Paying Living Expenses: Somewhat hard  Food Insecurity: No Food Insecurity (08/18/2023)   Received from [facility]   Hunger Vital Sign    Within the past 12 months, you worried that your food would run out before you got the money to buy more.: Never true    Within the past 12 months, the food you bought just didn't last and you didn't have money to get more.: Never true  Transportation Needs: No Transportation Needs (08/18/2023)   Received from [facility] - Transportation    In the past 12 months, has lack of transportation kept you from medical appointments or from getting medications?: No    Lack of Transportation (Non-Medical): No  Physical Activity: Inactive (04/15/2017)   Exercise Vital Sign    Days  of Exercise per Week: 0 days    Minutes of Exercise per Session: 0 min  Stress: Stress Concern Present (04/15/2017)   [employer] - Occupational Stress Questionnaire    Feeling of Stress : To some extent  Social Connections: Moderately Integrated (04/15/2017)   Social Connection and Isolation Panel    Frequency of Communication with Friends and Family: More than three times a week    Frequency of Social Gatherings with Friends and Family: Once a week    Attends Religious Services: Never    Database administrator or Organizations: Yes    Attends Engineer, structural: More than 4 times per year    Marital Status:  Married  Intimate Partner Violence: Not At Risk (10/01/2023)   Received from [facility]   Humiliation, Afraid, Rape, and Kick questionnaire    Within the last year, have you been afraid of your partner or ex-partner?: No    Within the last year, have you been humiliated or emotionally abused in other ways by your partner or ex-partner?: No    Within the last year, have you been kicked, hit, slapped, or otherwise physically hurt by your partner or ex-partner?: No    Within the last year, have you been raped or forced to have any kind of sexual activity by your partner or ex-partner?: No    Family History  Problem Relation Age of Onset   Hypertension Mother    Coronary artery disease Mother    Arthritis Mother    Osteoporosis Mother    Depression Mother    Anxiety disorder Mother    Coronary artery disease Father    Stroke Father    Coronary artery disease Brother    Bipolar disorder Brother    Drug abuse Brother    Coronary artery disease Maternal Aunt    Osteoporosis Maternal Aunt    Coronary artery disease Cousin    Osteoporosis Cousin    Anxiety disorder Daughter    Depression Daughter    Bone cancer Paternal Uncle    Lung cancer Paternal Grandmother    Ovarian cancer Cousin 57       CHEK2+   Breast cancer Neg Hx       Vitals:   12/23/23 0251 12/23/23 0330 12/23/23 0400 12/23/23 0500  BP: 125/82 116/62 109/63 126/64  Pulse: 66 68 77 60  Resp: 16 17 17 16   Temp: 98.5 F (36.9 C)     TempSrc: Oral     SpO2: 93% 93% 93% 93%  Weight:      Height:        PHYSICAL EXAM General: Well-appearing elderly female, well nourished, in no acute distress. HEENT: Normocephalic and atraumatic. Neck: No JVD.   Lungs: Normal respiratory effort on room air. Clear bilaterally to auscultation. No wheezes, crackles, rhonchi.  Heart: HRRR. Normal S1 and S2 without gallops or murmurs.  Abdomen: Non-distended appearing.  Msk: Normal strength and tone for age. Extremities: Warm and well perfused. No clubbing, cyanosis, edema.  Neuro: Alert and oriented X 3. Psych: Answers questions appropriately.   Labs: Basic Metabolic Panel: Recent Labs    12/22/23 1817  NA 142  K 3.4*  CL 109  CO2 22  GLUCOSE 117*  BUN 21  CREATININE 0.78  CALCIUM  9.0   Liver Function Tests: No results for input(s): AST, ALT, ALKPHOS, BILITOT, PROT, ALBUMIN in the last 72 hours. No results for input(s): LIPASE, AMYLASE in the last 72 hours. CBC: Recent Labs    12/22/23 1817  WBC 7.7  HGB 15.0  HCT 44.4  MCV 96.7  PLT 284   Cardiac Enzymes: Recent Labs    12/22/23 1817 12/22/23 2315  TROPONINIHS 5 6   BNP: No results for input(s): BNP in the last 72 hours. D-Dimer: No results for input(s): DDIMER in the last 72 hours. Hemoglobin A1C: No results for input(s): HGBA1C in the last 72 hours. Fasting Lipid Panel: Recent Labs    12/23/23 0619  CHOL 102  HDL 52  LDLCALC 36  TRIG 69  CHOLHDL 2.0   Thyroid  Function Tests: No results for input(s): TSH, T4TOTAL, T3FREE, THYROIDAB in the last 72 hours.  Invalid input(s): FREET3 Anemia  Panel: No results for input(s): VITAMINB12, FOLATE, FERRITIN, TIBC, IRON, RETICCTPCT in the last 72 hours.   Radiology: DG Chest 2  View Result Date: 12/22/2023 CLINICAL DATA:  Chest pain EXAM: CHEST - 2 VIEW COMPARISON:  Chest x-ray 11/24/2019 FINDINGS: Sternotomy wires are again seen. The heart size and mediastinal contours are within normal limits. Both lungs are clear. The visualized skeletal structures are unremarkable. IMPRESSION: No active cardiopulmonary disease. Electronically Signed   By: Greig Pique M.D.   On: 12/22/2023 18:59   CT Cervical Spine Wo Contrast Result Date: 12/10/2023 CLINICAL DATA:  Status post fall. EXAM: CT CERVICAL SPINE WITHOUT CONTRAST TECHNIQUE: Multidetector CT imaging of the cervical spine was performed without intravenous contrast. Multiplanar CT image reconstructions were also generated. RADIATION DOSE REDUCTION: This exam was performed according to the departmental dose-optimization program which includes automated exposure control, adjustment of the mA and/or kV according to patient size and/or use of iterative reconstruction technique. COMPARISON:  None Available. FINDINGS: Alignment: Approximately 1 mm retrolisthesis of the C5 vertebral body is noted on C6. Skull base and vertebrae: No acute fracture. No primary bone lesion or focal pathologic process. Soft tissues and spinal canal: No prevertebral fluid or swelling. No visible canal hematoma. Disc levels: Mild multilevel endplate sclerosis is seen throughout the cervical spine with mild to moderate severity anterior osteophyte formation and posterior bony spurring present at the levels of C3-C4, C4-C5 and C5-C6. There is moderate severity intervertebral disc space narrowing at C5-C6, with mild intervertebral disc space narrowing present throughout the remainder of the cervical spine. Bilateral mild-to-moderate severity multilevel facet joint hypertrophy is noted. Upper chest: Negative. Other: None. IMPRESSION: 1. No acute fracture or traumatic subluxation of the cervical spine. 2. Mild to moderate severity multilevel degenerative changes, most  prominent at the levels of C3-C4, C4-C5 and C5-C6. Electronically Signed   By: Suzen Dials M.D.   On: 12/10/2023 14:59   CT Head Wo Contrast Result Date: 12/10/2023 CLINICAL DATA:  Status post fall. EXAM: CT HEAD WITHOUT CONTRAST TECHNIQUE: Contiguous axial images were obtained from the base of the skull through the vertex without intravenous contrast. RADIATION DOSE REDUCTION: This exam was performed according to the departmental dose-optimization program which includes automated exposure control, adjustment of the mA and/or kV according to patient size and/or use of iterative reconstruction technique. COMPARISON:  None Available. FINDINGS: Brain: There is generalized cerebral atrophy with widening of the extra-axial spaces and ventricular dilatation. There are areas of decreased attenuation within the white matter tracts of the supratentorial brain, consistent with microvascular disease changes. Vascular: No hyperdense vessel or unexpected calcification. Skull: Normal. Negative for fracture or focal lesion. Sinuses/Orbits: No acute finding. Other: None. IMPRESSION: No acute intracranial abnormality. Electronically Signed   By: Suzen Dials M.D.   On: 12/10/2023 14:46   DG Wrist Complete Right Result Date: 12/10/2023 CLINICAL DATA:  fall EXAM: RIGHT WRIST - COMPLETE 3+ VIEW COMPARISON:  None Available. FINDINGS: No acute fracture or dislocation. There is no evidence of arthropathy or other focal bone abnormality. Soft tissues are unremarkable. IMPRESSION: No acute fracture or dislocation. Electronically Signed   By: Rogelia Myers M.D.   On: 12/10/2023 14:40    ECHO ordered  TELEMETRY reviewed by me 12/23/2023: Sinus rhythm, rate 60s  EKG reviewed by me: sinus rhythm, RBBB, rate 85 bpm (unchanged from prior EKGs)  Data reviewed by me 12/23/2023: last 24h vitals tele labs imaging I/O ED provider note, admission H&P.  Principal Problem:   Chest pain Active  Problems:   Dyslipidemia    Anxiety and depression   Hypothyroidism    ASSESSMENT AND PLAN:  Lori Wagner is a 73 y.o. female  with a past medical history of coronary artery disease s/p CABG 2001 (LIMA-LAD sequential to D2, Free radial to D1 and ramus) and PCIs most recently in 2019, chronic HFpEF, hypertension, hyperlipidemia, paroxysmal supraventricular tachycardia s/p ablation 2018  who presented to the ED on 12/22/2023 for chest pain and orthostatic hypotensive symptoms. Cardiology was consulted for further evaluation.   # Chest pain # Orthostatic hypotension # CAD s/p CABG, multiple PCIs # Hypertension # Hyperlipidemia EKG with sinus rhythm, RBBB, rate 85 bpm (unchanged from prior EKGs).  Troponins negative x 2.  Lipid panel within normal limit limits, LDL 36 -Echo ordered. -Plan for cardiac stress test today.  Has remained n.p.o. and is agreeable to testing.  Further recommendations pending results. -Continue home aspirin  81 mg, atorvastatin  80 mg, Zetia  10 mg daily. -Continue home bisoprolol  2.5 mg daily. -Ordered Ranexa  500 mg twice daily for antianginal optimization. -Continue home Imdur  60 mg daily.  Will plan to decrease this due to patient's reported worsening orthostatic hypotension. -Recommend performing orthostatic BP measurements.  # Chronic HFpEF Patient without SOB, LEE. CXR with congestion or pleural effusions. Patient appears euvolemic. -Continue home dapagliflozin 10 mg daily.  This patient's plan of care was discussed and created with Dr. Wilburn and he is in agreement.  Signed: Dorene Comfort, PA-C  12/23/2023, 7:38 AM [facility] Card[facility]

## 2023-12-23 NOTE — ED Notes (Signed)
 Per. Dr. Caleen, pt to receive Echo from her cardiologist. Genna to discharge.

## 2023-12-23 NOTE — ED Notes (Signed)
 Called CCMD and added pt to board.

## 2024-01-01 ENCOUNTER — Ambulatory Visit: Attending: Plastic Surgery

## 2024-01-01 DIAGNOSIS — G5601 Carpal tunnel syndrome, right upper limb: Secondary | ICD-10-CM | POA: Insufficient documentation

## 2024-01-01 DIAGNOSIS — R278 Other lack of coordination: Secondary | ICD-10-CM | POA: Insufficient documentation

## 2024-01-01 DIAGNOSIS — M6281 Muscle weakness (generalized): Secondary | ICD-10-CM | POA: Insufficient documentation

## 2024-01-01 NOTE — Therapy (Signed)
 OUTPATIENT OCCUPATIONAL THERAPY TREATMENT  Patient Name: Lori Wagner MRN: [id number] DOB:Oct 16, 1950, 73 y.o., female Today's Date: 01/01/2024  PCP: Valora Agent, MD REFERRING PROVIDER: Sheryle Sharper, MD  END OF SESSION:  OT End of Session - 01/01/24 0759     Visit Number 5    Number of Visits 24    Date for OT Re-Evaluation 02/26/24    OT Start Time 0800    OT Stop Time 0845    OT Time Calculation (min) 45 min    Activity Tolerance Patient tolerated treatment well    Behavior During Therapy WFL for tasks assessed/performed           Past Medical History:  Diagnosis Date   Actinic keratosis    Anxiety    Arthritis    knees,hip , back and hands   CHF (congestive heart failure) (HCC)    Chronic kidney disease    Hx of kidney stones, years ago   Coronary artery disease    Dysrhythmia    SVT- controlled with Beta blocker   Family history of bone cancer    Family history of lung cancer    Family history of ovarian cancer    Fatigue    HOH (hard of hearing)    tinnitis both ears   Hypertension     can cause lightheadedness   Motion sickness    Neuromuscular disorder (HCC)    carpal tunnel both hands   PONV (postoperative nausea and vomiting)    Post herpetic neuralgia    left side of face   RA (rheumatoid arthritis) (HCC)    Shingles 02/2017   left side of face   Sleep apnea    Hx of/ when overweight, no CPAP   SVT (supraventricular tachycardia) (HCC)    Thyroid  disease    HX OF/ NO LONGER TAKING THYROID  MEDS, THYROID  TESTS CAME BACK NORMAL   Vertigo    Hx of   Past Surgical History:  Procedure Laterality Date   ANKLE SURGERY Left    APPENDECTOMY     BLADDER REPAIR     CARDIAC CATHETERIZATION     CARDIAC SURGERY     CATARACT EXTRACTION W/PHACO Left 06/03/2017   Procedure: CATARACT EXTRACTION PHACO AND INTRAOCULAR LENS PLACEMENT (IOC)  LEFT;  Surgeon: Myrna Adine Anes, MD;  Location: [facility];  Service: Ophthalmology;   Laterality: Left;   CATARACT EXTRACTION W/PHACO Right 07/08/2017   Procedure: CATARACT EXTRACTION PHACO AND INTRAOCULAR LENS PLACEMENT (IOC) RIGHT;  Surgeon: Myrna Adine Anes, MD;  Location: [facility];  Service: Ophthalmology;  Laterality: Right;   COLONOSCOPY N/A 01/08/2017   Procedure: COLONOSCOPY;  Surgeon: Unk Corinn Skiff, MD;  Location: [facility];  Service: Gastroenterology;  Laterality: N/A;   CORONARY ARTERY BYPASS GRAFT  2001   x4   GASTRIC BYPASS     SLEEVE   HAND SURGERY Right    2 TRIGGER FINGERS   KNEE ARTHROSCOPY Right    renal colic     TRIGGER FINGER RELEASE Right 01/17/2016   Procedure: RELEASE TRIGGER FINGER/A-1 PULLEY THUMB;  Surgeon: Norleen JINNY Maltos, MD;  Location: [facility];  Service: Orthopedics;  Laterality: Right;   Patient Active Problem List   Diagnosis Date Noted   Dyslipidemia 12/23/2023   Anxiety and depression 12/23/2023   Hypothyroidism 12/23/2023   Monoclonal gammopathy 02/24/2023   Chest pain 11/24/2019   Genetic testing 02/02/2019   Family history of ovarian cancer    Family history of bone cancer  Family history of lung cancer    CAD (coronary artery disease) 07/03/2017   Orthostatic lightheadedness 07/03/2017   Proctitis 01/07/2017   Constipation    LLQ abdominal pain    Abnormal CT of the abdomen    Tremor 08/07/2016   SVT (supraventricular tachycardia) (HCC) 06/25/2016   Trigger finger of right thumb 01/18/2016   Carpal tunnel syndrome 10/13/2015   Bilateral carpal tunnel syndrome 10/13/2015   Arthralgia of multiple joints 07/03/2015   CFIDS (chronic fatigue and immune dysfunction syndrome) (HCC) 07/03/2015   Degeneration of intervertebral disc of lumbar region 07/03/2015   Hand paresthesia 07/03/2015   Major depressive disorder, recurrent, severe without psychotic features (HCC) 12/21/2014   Panic disorder 12/21/2014   Impingement syndrome of shoulder 07/22/2014   Infraspinatus tenosynovitis 07/22/2014    Impingement syndrome of left shoulder 07/22/2014   Other synovitis and tenosynovitis, left shoulder 07/22/2014   Left supraspinatus tenosynovitis 07/22/2014   Acquired hypothyroidism 07/12/2014   Pain in shoulder 07/12/2014   Clinical depression 07/12/2014   Hyperlipidemia 07/12/2014    ONSET DATE: 11/12/2023  REFERRING DIAG: Right Carpal Tunnel Release  THERAPY DIAG:  Carpal tunnel syndrome, right upper limb  Muscle weakness (generalized)  Rationale for Evaluation and Treatment: Rehabilitation  SUBJECTIVE:   SUBJECTIVE STATEMENT: Pt reports she is moving and has increased pain from lifting boxes.  Pt accompanied by: self  PERTINENT HISTORY:  Pt. is a 73 y. o. who had Carpal Tunnel Surgery on 11/12/2023. Pt. 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 Pt. has had recent injections of the Right 1st digit CMC arthritis, and injections to the left 2nd, and 3rd digit trigger fingers. PMHx includes: CAD with CABG in 2001, Gastric Bypass surgery, Anxiety, Depression, Disease of the thyroid  gland, HTN, and OSA.  PRECAUTIONS: None  WEIGHT BEARING RESTRICTIONS: No  PAIN:  Are you having pain? 01/01/24: /10 at rest, 7/10 raw sensation at incision, 7/10 pain with AROM at end point wrist extension.  12/15/2023: 2/10 pain at rest, 7-8/10 raw sensation at the incision scar. 4-5/10 pain with AROM during wrist extension.  1/10- Pt. Reports 10/10 pain when attempting to use her hand, or pick up her pet  FALLS: Has patient fallen in last 6 months? Yes, fall occurred on 12/08/2023  LIVING ENVIRONMENT: Lives with: lives with their spouse Lives in: House/apartment Stairs: one story Has following equipment: Has access to wrist braces in needed  PLOF:  Difficulty typing prior to the surgery  PATIENT GOALS:  To relief pain, and increase function  OBJECTIVE:  Note: Objective measures were completed at Evaluation unless otherwise noted.  HAND DOMINANCE:  Right  ADLs:  Transfers/ambulation related to ADLs: Eating: Independent, self-feeding, difficulty cutting food, opening bottles, packets, containers Grooming: Painful when brushing teeth with the right hand UB Dressing: Pt. Reports that she is improving with fastening a  bra LB Dressing:  zipping pants, buttoning pants are difficult to perform Toileting: Difficulty with toileting hygiene care Bathing: Independent-Takes tub baths Tub Shower transfers: Difficulty at times getting in and out of the tub for tub baths.  IADLs: Shopping: Carrying heavy it Light housekeeping: Pt. Husband performs housekeeping tasks Meal Prep: Husband typically performs meal prep tasks.  Community mobility:  Independent. Medication management:  Difficulty opening bottles Financial management: No change Handwriting: 50% legibility for name   Hobbies: Woodworking/carving/building furniture   MOBILITY STATUS: Independent  FUNCTIONAL OUTCOME MEASURES: MAM-20: 64/80  UPPER EXTREMITY ROM:    Active ROM  Right Eval  Right 12/15/2023 Left eval  Shoulder flexion [facility]  [facility]  Shoulder abduction [facility]  [name]  Shoulder adduction     Shoulder extension     Shoulder internal rotation     Shoulder external rotation     Elbow flexion WFL  WFL  Elbow extension [facility]  WFL  Wrist flexion 74(74)  86(86)  Wrist extension 64(68) 22(comfort level), 44 (52 passively) 64(66)  Wrist ulnar deviation 32(34)  38(38)  Wrist radial deviation 24(26)  24(26)  Wrist pronation     Wrist supination     (Blank rows = not tested)  UPPER EXTREMITY MMT:     MMT Right Eval  Left Eval   Shoulder flexion 5/5 5/5  Shoulder abduction 5/5 5/5  Shoulder adduction    Shoulder extension    Shoulder internal rotation    Shoulder external rotation    Middle trapezius    Lower trapezius    Elbow flexion 5/5 5/5  Elbow extension 5/5 5/5  Wrist flexion 4/5 5/5  Wrist extension 4/5 5/5  Wrist ulnar deviation    Wrist radial deviation     Wrist pronation    Wrist supination    (Blank rows = not tested)  HAND FUNCTION: Grip strength: Right: 10 lbs; Left: 25 lbs, Lateral pinch: Right: 8 lbs, Left: 10 lbs, and 3 point pinch: Right: 4 lbs, Left: 7 lbs  COORDINATION: 9 Hole Peg test: Right: 27 sec; Left: 21 sec  SENSATION: Light touch: WFL  EDEMA: none  MUSCLE TONE:  Intact  COGNITION: Overall cognitive status: Within functional limits for tasks assessed   TREATMENT DATE: 01/01/24    Contrast Bath:  Contrast to the Right hand 2/2 pain, edema, and stiffness. Contrast bath was performed in preparation for manual therapy, and there. Ex.     Manual Therapy:   -Pt. tolerated soft tissue massage to the right hand at the volar aspect of the hand medial, and lateral to the incision, the thenar eminence, hypothenar eminence, and the carpal tunnel  2/2 pain.  -Pt. tolerated gentle scar massage at the right carpal tunnel incision site.  Therapeutic Ex.:  -Reviewed HEP with written handout provided  - Pt. Performed AAROM/AROM/PROM R wrist extension/flexion with stabilizing R elbow on tabletop surface. - Performed 1 set x 10 reps each: wrist circles, wrist flexion/extension/radial deviation, and prayer stretch with cues to stay in pain free range.   Self Care:  -Isotoner glove provided for edema mgmt with instructions to trial overnight.  -Educated on proper body mechanics to carry boxes and minimize gripping for pain mgmt.  -Educated on rest breaks and over the counter medication as needed for pain mgmt.   PATIENT EDUCATION: Education details: contrast bath, STM, blue resistive foam block exercises Person educated: Patient Education method: Explanation, Demonstration, and Tactile cues Education comprehension: verbalized understanding, returned demonstration, verbal cues required, tactile cues required, and needs further education  HOME EXERCISE PROGRAM: Blue Resistive Foam Block exercises: 3 point pinch, lateral  pinch and gross grip.  GOALS: Goals reviewed with patient? No  SHORT TERM GOALS: Target date: 01/15/2024   Pt. will be independent with HEPs for RUE strength Baseline: Eval: No current HEP Goal status: INITIAL  LONG TERM GOALS: Target date: 02/26/2024  To decrease pain by 2 points on the pain scale to be able to engage in ADLs/IADLs Baseline: Eval: 2/10 with 8/10 intermittent shooting pain in the right wrist/hand. Goal status: INITIAL  2.  Pt. Will  improve right grip strength  by 5# to be able to open jars, and containers  more efficiently.  Baseline: Eval: Grip strength: Right: 10#, Left: 25# Goal status: INITIAL  3.  Pt. Will improve right lateral pinch strength by 3# to be able to hold and use a toothbrush efficiently. Baseline: Eval: Lateral pinch strength: Right: 8#, Left 10 # Goal status: INITIAL  4.  Pt. Will improve right 3pt. pinch strength to be able to to be able to cut food. Baseline: Eval: 3Pt. Pinch strength: Right: 4#, L: 7# Goal status: INITIAL  5.  Pt. Will improve right hand [facility] skills by 2 sec. Of speed to be able to grasp and manage pant buttons, and zippers. Baseline: Eval: FMC skills: Right: 27 sec., Left: 21 sec.  Goal status: INITIAL  6.  Pt. Will improve MAM-20 score by 3 points to reflect improved ADL/IADL performance Baseline: Eval: MAM-20 sum score: 64/80 Goal status: INITIAL  ASSESSMENT:  CLINICAL IMPRESSION:  Pt presents with increased pain in R wrist from overexertion while moving boxes into new house. Pt. tolerated soft tissue/scar massage and contrast bath well with decreased edema but minimal improvement in pain. Provided isotoner compression glove to trial overnight for compression mgmt. Continues to have limited AROM with wrist flexion/extension due pain at incision site. Reviewed HEP with written handout and MIN cues to perform wrist AROM exercises. Pt. continues to benefit from OT services to work on improving right hand pain, increasing  right grip, and pinch strength, and improve [facility] skills in order to be able to perform daily ADL, and IADL tasks more efficiently.  PERFORMANCE DEFICITS: in functional skills including ADLs, IADLs, coordination, dexterity, ROM, strength, pain, Fine motor control, Gross motor control, and UE functional use, cognitive skills including , and psychosocial skills including environmental adaptation and routines and behaviors.   IMPAIRMENTS: are limiting patient from ADLs, IADLs, and leisure.   CO-MORBIDITIES: may have co-morbidities  that affects occupational performance. Patient will benefit from skilled OT to address above impairments and improve overall function.  MODIFICATION OR ASSISTANCE TO COMPLETE EVALUATION: Min-Moderate modification of tasks or assist with assess necessary to complete an evaluation.  OT OCCUPATIONAL PROFILE AND HISTORY: Detailed assessment: Review of records and additional review of physical, cognitive, psychosocial history related to current functional performance.  CLINICAL DECISION MAKING: Moderate - several treatment options, min-mod task modification necessary  REHAB POTENTIAL: Good  EVALUATION COMPLEXITY: Moderate    PLAN:  OT FREQUENCY: 2x/week  OT DURATION: 12 weeks  PLANNED INTERVENTIONS: 97535 self care/ADL training, 02889 therapeutic exercise, 97530 therapeutic activity, 97112 neuromuscular re-education, 97140 manual therapy, 97035 ultrasound, 97018 paraffin, 02989 moist heat, 97010 cryotherapy, 97034 contrast bath, 97033 iontophoresis, 97760 Orthotic Initial, 97763 Orthotic/Prosthetic subsequent, scar mobilization, patient/family education, and DME and/or AE instructions  RECOMMENDED OTHER SERVICES: None  CONSULTED AND AGREED WITH PLAN OF CARE: Patient  PLAN FOR NEXT SESSION: Treatment  Elston Slot, M.S. OTR/L  01/01/24, 8:00 AM  ascom [phone]   01/01/2024, 8:00 AM

## 2024-01-05 ENCOUNTER — Ambulatory Visit

## 2024-01-05 DIAGNOSIS — G5601 Carpal tunnel syndrome, right upper limb: Secondary | ICD-10-CM | POA: Diagnosis not present

## 2024-01-05 DIAGNOSIS — M6281 Muscle weakness (generalized): Secondary | ICD-10-CM

## 2024-01-05 NOTE — Therapy (Signed)
 OUTPATIENT OCCUPATIONAL THERAPY TREATMENT  Patient Name: Lori Wagner MRN: [id number] DOB:02/16/1951, 73 y.o., female Today's Date: 01/06/2024  PCP: Valora Agent, MD REFERRING PROVIDER: Sheryle Sharper, MD  END OF SESSION:  OT End of Session - 01/06/24 1406     Visit Number 6    Number of Visits 24    Date for OT Re-Evaluation 02/26/24    OT Start Time 1100    OT Stop Time 1145    OT Time Calculation (min) 45 min    Activity Tolerance Patient tolerated treatment well    Behavior During Therapy WFL for tasks assessed/performed         Past Medical History:  Diagnosis Date   Actinic keratosis    Anxiety    Arthritis    knees,hip , back and hands   CHF (congestive heart failure) (HCC)    Chronic kidney disease    Hx of kidney stones, years ago   Coronary artery disease    Dysrhythmia    SVT- controlled with Beta blocker   Family history of bone cancer    Family history of lung cancer    Family history of ovarian cancer    Fatigue    HOH (hard of hearing)    tinnitis both ears   Hypertension     can cause lightheadedness   Motion sickness    Neuromuscular disorder (HCC)    carpal tunnel both hands   PONV (postoperative nausea and vomiting)    Post herpetic neuralgia    left side of face   RA (rheumatoid arthritis) (HCC)    Shingles 02/2017   left side of face   Sleep apnea    Hx of/ when overweight, no CPAP   SVT (supraventricular tachycardia) (HCC)    Thyroid  disease    HX OF/ NO LONGER TAKING THYROID  MEDS, THYROID  TESTS CAME BACK NORMAL   Vertigo    Hx of   Past Surgical History:  Procedure Laterality Date   ANKLE SURGERY Left    APPENDECTOMY     BLADDER REPAIR     CARDIAC CATHETERIZATION     CARDIAC SURGERY     CATARACT EXTRACTION W/PHACO Left 06/03/2017   Procedure: CATARACT EXTRACTION PHACO AND INTRAOCULAR LENS PLACEMENT (IOC)  LEFT;  Surgeon: Myrna Adine Anes, MD;  Location: [facility];  Service: Ophthalmology;  Laterality:  Left;   CATARACT EXTRACTION W/PHACO Right 07/08/2017   Procedure: CATARACT EXTRACTION PHACO AND INTRAOCULAR LENS PLACEMENT (IOC) RIGHT;  Surgeon: Myrna Adine Anes, MD;  Location: [facility];  Service: Ophthalmology;  Laterality: Right;   COLONOSCOPY N/A 01/08/2017   Procedure: COLONOSCOPY;  Surgeon: Unk Corinn Skiff, MD;  Location: [facility];  Service: Gastroenterology;  Laterality: N/A;   CORONARY ARTERY BYPASS GRAFT  2001   x4   GASTRIC BYPASS     SLEEVE   HAND SURGERY Right    2 TRIGGER FINGERS   KNEE ARTHROSCOPY Right    renal colic     TRIGGER FINGER RELEASE Right 01/17/2016   Procedure: RELEASE TRIGGER FINGER/A-1 PULLEY THUMB;  Surgeon: Norleen JINNY Maltos, MD;  Location: [facility];  Service: Orthopedics;  Laterality: Right;   Patient Active Problem List   Diagnosis Date Noted   Dyslipidemia 12/23/2023   Anxiety and depression 12/23/2023   Hypothyroidism 12/23/2023   Monoclonal gammopathy 02/24/2023   Chest pain 11/24/2019   Genetic testing 02/02/2019   Family history of ovarian cancer    Family history of bone cancer    Family history  of lung cancer    CAD (coronary artery disease) 07/03/2017   Orthostatic lightheadedness 07/03/2017   Proctitis 01/07/2017   Constipation    LLQ abdominal pain    Abnormal CT of the abdomen    Tremor 08/07/2016   SVT (supraventricular tachycardia) (HCC) 06/25/2016   Trigger finger of right thumb 01/18/2016   Carpal tunnel syndrome 10/13/2015   Bilateral carpal tunnel syndrome 10/13/2015   Arthralgia of multiple joints 07/03/2015   CFIDS (chronic fatigue and immune dysfunction syndrome) (HCC) 07/03/2015   Degeneration of intervertebral disc of lumbar region 07/03/2015   Hand paresthesia 07/03/2015   Major depressive disorder, recurrent, severe without psychotic features (HCC) 12/21/2014   Panic disorder 12/21/2014   Impingement syndrome of shoulder 07/22/2014   Infraspinatus tenosynovitis 07/22/2014   Impingement  syndrome of left shoulder 07/22/2014   Other synovitis and tenosynovitis, left shoulder 07/22/2014   Left supraspinatus tenosynovitis 07/22/2014   Acquired hypothyroidism 07/12/2014   Pain in shoulder 07/12/2014   Clinical depression 07/12/2014   Hyperlipidemia 07/12/2014   ONSET DATE: 11/12/2023  REFERRING DIAG: Right Carpal Tunnel Release  THERAPY DIAG:  Muscle weakness (generalized)  Carpal tunnel syndrome, right upper limb  Rationale for Evaluation and Treatment: Rehabilitation  SUBJECTIVE:   SUBJECTIVE STATEMENT: Pt reports that she made a follow up visit with her surgeon in about 10 days because she is concerned they might have to go back in and remove scar tissue d/t her pain being so high in her R hand. Pt accompanied by: self  PERTINENT HISTORY:  Pt. is a 65 y. o. who had Carpal Tunnel Surgery on 11/12/2023. Pt. 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 Pt. has had recent injections of the Right 1st digit CMC arthritis, and injections to the left 2nd, and 3rd digit trigger fingers. PMHx includes: CAD with CABG in 2001, Gastric Bypass surgery, Anxiety, Depression, Disease of the thyroid  gland, HTN, and OSA.  PRECAUTIONS: None  WEIGHT BEARING RESTRICTIONS: No  PAIN:  Are you having pain? 01/05/24: 3-4/10 pain at rest, can be 8/10 with activity, but this activity is heavy work (lifting furniture/boxes, getting up off the floor) 01/01/24: /10 at rest, 7/10 raw sensation at incision, 7/10 pain with AROM at end point wrist extension.  12/15/2023: 2/10 pain at rest, 7-8/10 raw sensation at the incision scar. 4-5/10 pain with AROM during wrist extension.  1/10- Pt. Reports 10/10 pain when attempting to use her hand, or pick up her pet  FALLS: Has patient fallen in last 6 months? Yes, fall occurred on 12/08/2023  LIVING ENVIRONMENT: Lives with: lives with their spouse Lives in: House/apartment Stairs: one story Has following equipment:  Has access to wrist braces in needed  PLOF:  Difficulty typing prior to the surgery  PATIENT GOALS:  To relief pain, and increase function  OBJECTIVE:  Note: Objective measures were completed at Evaluation unless otherwise noted.  HAND DOMINANCE: Right  ADLs:  Transfers/ambulation related to ADLs: Eating: Independent, self-feeding, difficulty cutting food, opening bottles, packets, containers Grooming: Painful when brushing teeth with the right hand UB Dressing: Pt. Reports that she is improving with fastening a  bra LB Dressing:  zipping pants, buttoning pants are difficult to perform Toileting: Difficulty with toileting hygiene care Bathing: Independent-Takes tub baths Tub Shower transfers: Difficulty at times getting in and out of the tub for tub baths.  IADLs: Shopping: Carrying heavy it Light housekeeping: Pt. Husband performs housekeeping tasks Meal Prep: Husband typically performs  meal prep tasks.  Community mobility:  Independent. Medication management:  Difficulty opening bottles Financial management: No change Handwriting: 50% legibility for name   Hobbies: Woodworking/carving/building furniture  MOBILITY STATUS: Independent  FUNCTIONAL OUTCOME MEASURES: MAM-20: 64/80  UPPER EXTREMITY ROM:    Active ROM Right Eval  Right 12/15/2023 Left eval  Shoulder flexion [facility]  Shoulder abduction [facility]  Shoulder adduction     Shoulder extension     Shoulder internal rotation     Shoulder external rotation     Elbow flexion WFL  WFL  Elbow extension [facility]  WFL  Wrist flexion 74(74)  86(86)  Wrist extension 64(68) 22(comfort level), 44 (52 passively) 64(66)  Wrist ulnar deviation 32(34)  38(38)  Wrist radial deviation 24(26)  24(26)  Wrist pronation     Wrist supination     (Blank rows = not tested)  UPPER EXTREMITY MMT:     MMT Right Eval  Left Eval   Shoulder flexion 5/5 5/5  Shoulder abduction 5/5 5/5  Shoulder adduction    Shoulder extension     Shoulder internal rotation    Shoulder external rotation    Middle trapezius    Lower trapezius    Elbow flexion 5/5 5/5  Elbow extension 5/5 5/5  Wrist flexion 4/5 5/5  Wrist extension 4/5 5/5  Wrist ulnar deviation    Wrist radial deviation    Wrist pronation    Wrist supination    (Blank rows = not tested)  HAND FUNCTION: Grip strength: Right: 10 lbs; Left: 25 lbs, Lateral pinch: Right: 8 lbs, Left: 10 lbs, and 3 point pinch: Right: 4 lbs, Left: 7 lbs 01/05/24: Grip strength: Right: 25 lbs, Left: 30 lbs  COORDINATION: 9 Hole Peg test: Right: 27 sec; Left: 21 sec  SENSATION: Light touch: WFL  EDEMA: none  MUSCLE TONE:  Intact  COGNITION: Overall cognitive status: Within functional limits for tasks assessed   TREATMENT DATE: 01/05/24  Manual Therapy:  -Scar massage completed at and around carpal tunnel incision; OT reviewed scar massage technique and issued small square of dycem to mobilize scar in each direction.   -Soft tissue massage performed at the thenar eminence and hypothenar eminence, working to reduce pain and stiffness.    Therapeutic Ex.:  Issued yellow theraputty and instructed pt in strengthening exercises for R hand, including gross grasping, lateral/2 point/3 point pinching, and digit abd/add.  Constant monitoring for pt tolerance, noting no increase in pain.  OT advised on working with putty conservatively, maintaining low reps to avoid increased arthritic pain and or flare of trigger fingers.  Able to return demo with intermittent min vc for technique to improve quality of movement.    Paraffin:  X10 min for R hand pain management/muscle relaxation. Sensitive heat setting (116* at time of use) used d/t inability to tolerate Normal setting at 127* (attempted at beginning of session).  PATIENT EDUCATION: Education details: HEP progression Person educated: Patient Education method: Solicitor, Verbal cues, and Handouts Education  comprehension: verbalized understanding, returned demonstration, verbal cues required, tactile cues required, and needs further education  HOME EXERCISE PROGRAM: Blue Resistive Foam Block exercises: 3 point pinch, lateral pinch and gross grip. Yellow theraputty (recommendation to perform conservatively/low reps/to tolerance)  GOALS: Goals reviewed with patient? No  SHORT TERM GOALS: Target date: 01/15/2024   Pt. will be independent with HEPs for RUE strength Baseline: Eval: No current HEP Goal status: INITIAL  LONG TERM GOALS: Target date: 02/26/2024  To  decrease pain by 2 points on the pain scale to be able to engage in ADLs/IADLs Baseline: Eval: 2/10 with 8/10 intermittent shooting pain in the right wrist/hand. Goal status: INITIAL  2.  Pt. Will  improve right grip strength by 5# to be able to open jars, and containers  more efficiently.  Baseline: Eval: Grip strength: Right: 10#, Left: 25# Goal status: INITIAL  3.  Pt. Will improve right lateral pinch strength by 3# to be able to hold and use a toothbrush efficiently. Baseline: Eval: Lateral pinch strength: Right: 8#, Left 10 # Goal status: INITIAL  4.  Pt. Will improve right 3pt. pinch strength to be able to to be able to cut food. Baseline: Eval: 3Pt. Pinch strength: Right: 4#, L: 7# Goal status: INITIAL  5.  Pt. Will improve right hand [facility] skills by 2 sec. Of speed to be able to grasp and manage pant buttons, and zippers. Baseline: Eval: FMC skills: Right: 27 sec., Left: 21 sec.  Goal status: INITIAL  6.  Pt. Will improve MAM-20 score by 3 points to reflect improved ADL/IADL performance Baseline: Eval: MAM-20 sum score: 64/80 Goal status: INITIAL  ASSESSMENT:  CLINICAL IMPRESSION: Able to progress HEP this date with issue of yellow theraputty (light resistance).  Pt reported no lingering thumb pain from her recent fall, which she confirmed was the reason theraputty had not yet been attempted.  Pt was observed to  tolerate theraputty well this date, verbalized no increase in pain from putty use, and acknowledged understanding of goal to maintain low reps and to complete to her personal tolerance.  Paraffin attempted today on normal heat setting (127* at time of trial), but pt was unable to tolerate, so setting was reduced to sensitive and trialed again near end of session.  Pt was able to tolerate 116*, but still somewhat sensitive, and verbalized that she would likely prefer not to do paraffin each session.  Moist heat pack recommended with 1-2 pillow cases for pain management in follow up sessions.  Fair tolerance to manual therapy today with scar massage and soft tissue massage.  Pt encouraged to continue these STM techniques at home to mobilize scar tissue and reduce hypersensitivity to the carpal tunnel region.  Grip strength re-measured today and noted to have improved from 10 to 25 lbs since eval, though this is still reduced, considering pt is R hand dominant (L grip 30 lbs).  Pt. continues to benefit from OT services to work on improving right hand pain, increasing right grip, and pinch strength, and improve [facility] skills in order to be able to perform daily ADL, and IADL tasks more efficiently.  PERFORMANCE DEFICITS: in functional skills including ADLs, IADLs, coordination, dexterity, ROM, strength, pain, Fine motor control, Gross motor control, and UE functional use, cognitive skills including , and psychosocial skills including environmental adaptation and routines and behaviors.   IMPAIRMENTS: are limiting patient from ADLs, IADLs, and leisure.   CO-MORBIDITIES: may have co-morbidities  that affects occupational performance. Patient will benefit from skilled OT to address above impairments and improve overall function.  MODIFICATION OR ASSISTANCE TO COMPLETE EVALUATION: Min-Moderate modification of tasks or assist with assess necessary to complete an evaluation.  OT OCCUPATIONAL PROFILE AND HISTORY:  Detailed assessment: Review of records and additional review of physical, cognitive, psychosocial history related to current functional performance.  CLINICAL DECISION MAKING: Moderate - several treatment options, min-mod task modification necessary  REHAB POTENTIAL: Good  EVALUATION COMPLEXITY: Moderate    PLAN:  OT FREQUENCY:  2x/week  OT DURATION: 12 weeks  PLANNED INTERVENTIONS: 97535 self care/ADL training, 02889 therapeutic exercise, 97530 therapeutic activity, 97112 neuromuscular re-education, 97140 manual therapy, 97035 ultrasound, 97018 paraffin, 02989 moist heat, 97010 cryotherapy, 97034 contrast bath, 97033 iontophoresis, 97760 Orthotic Initial, 97763 Orthotic/Prosthetic subsequent, scar mobilization, patient/family education, and DME and/or AE instructions  RECOMMENDED OTHER SERVICES: None  CONSULTED AND AGREED WITH PLAN OF CARE: Patient  PLAN FOR NEXT SESSION: Treatment  Inocente Blazing, MS, OTR/L  01/06/2024, 2:07 PM

## 2024-01-07 ENCOUNTER — Ambulatory Visit: Admitting: Occupational Therapy

## 2024-01-07 ENCOUNTER — Encounter: Payer: Self-pay | Admitting: Occupational Therapy

## 2024-01-07 DIAGNOSIS — G5601 Carpal tunnel syndrome, right upper limb: Secondary | ICD-10-CM | POA: Diagnosis not present

## 2024-01-07 DIAGNOSIS — R278 Other lack of coordination: Secondary | ICD-10-CM

## 2024-01-07 DIAGNOSIS — M6281 Muscle weakness (generalized): Secondary | ICD-10-CM

## 2024-01-07 NOTE — Therapy (Signed)
 OUTPATIENT OCCUPATIONAL THERAPY TREATMENT  Patient Name: Lori Wagner MRN: [id number] DOB:November 27, 1950, 73 y.o., female Today's Date: 01/07/2024  PCP: Valora Agent, MD REFERRING PROVIDER: Sheryle Sharper, MD  END OF SESSION:  OT End of Session - 01/07/24 0810     Visit Number 7    Number of Visits 24    Date for OT Re-Evaluation 02/26/24    OT Start Time 0810    OT Stop Time 0845    OT Time Calculation (min) 35 min    Activity Tolerance Patient tolerated treatment well    Behavior During Therapy WFL for tasks assessed/performed          Past Medical History:  Diagnosis Date   Actinic keratosis    Anxiety    Arthritis    knees,hip , back and hands   CHF (congestive heart failure) (HCC)    Chronic kidney disease    Hx of kidney stones, years ago   Coronary artery disease    Dysrhythmia    SVT- controlled with Beta blocker   Family history of bone cancer    Family history of lung cancer    Family history of ovarian cancer    Fatigue    HOH (hard of hearing)    tinnitis both ears   Hypertension     can cause lightheadedness   Motion sickness    Neuromuscular disorder (HCC)    carpal tunnel both hands   PONV (postoperative nausea and vomiting)    Post herpetic neuralgia    left side of face   RA (rheumatoid arthritis) (HCC)    Shingles 02/2017   left side of face   Sleep apnea    Hx of/ when overweight, no CPAP   SVT (supraventricular tachycardia) (HCC)    Thyroid  disease    HX OF/ NO LONGER TAKING THYROID  MEDS, THYROID  TESTS CAME BACK NORMAL   Vertigo    Hx of   Past Surgical History:  Procedure Laterality Date   ANKLE SURGERY Left    APPENDECTOMY     BLADDER REPAIR     CARDIAC CATHETERIZATION     CARDIAC SURGERY     CATARACT EXTRACTION W/PHACO Left 06/03/2017   Procedure: CATARACT EXTRACTION PHACO AND INTRAOCULAR LENS PLACEMENT (IOC)  LEFT;  Surgeon: Myrna Adine Anes, MD;  Location: [facility];  Service: Ophthalmology;   Laterality: Left;   CATARACT EXTRACTION W/PHACO Right 07/08/2017   Procedure: CATARACT EXTRACTION PHACO AND INTRAOCULAR LENS PLACEMENT (IOC) RIGHT;  Surgeon: Myrna Adine Anes, MD;  Location: [facility];  Service: Ophthalmology;  Laterality: Right;   COLONOSCOPY N/A 01/08/2017   Procedure: COLONOSCOPY;  Surgeon: Unk Corinn Skiff, MD;  Location: [facility];  Service: Gastroenterology;  Laterality: N/A;   CORONARY ARTERY BYPASS GRAFT  2001   x4   GASTRIC BYPASS     SLEEVE   HAND SURGERY Right    2 TRIGGER FINGERS   KNEE ARTHROSCOPY Right    renal colic     TRIGGER FINGER RELEASE Right 01/17/2016   Procedure: RELEASE TRIGGER FINGER/A-1 PULLEY THUMB;  Surgeon: Norleen JINNY Maltos, MD;  Location: [facility];  Service: Orthopedics;  Laterality: Right;   Patient Active Problem List   Diagnosis Date Noted   Dyslipidemia 12/23/2023   Anxiety and depression 12/23/2023   Hypothyroidism 12/23/2023   Monoclonal gammopathy 02/24/2023   Chest pain 11/24/2019   Genetic testing 02/02/2019   Family history of ovarian cancer    Family history of bone cancer    Family  history of lung cancer    CAD (coronary artery disease) 07/03/2017   Orthostatic lightheadedness 07/03/2017   Proctitis 01/07/2017   Constipation    LLQ abdominal pain    Abnormal CT of the abdomen    Tremor 08/07/2016   SVT (supraventricular tachycardia) (HCC) 06/25/2016   Trigger finger of right thumb 01/18/2016   Carpal tunnel syndrome 10/13/2015   Bilateral carpal tunnel syndrome 10/13/2015   Arthralgia of multiple joints 07/03/2015   CFIDS (chronic fatigue and immune dysfunction syndrome) (HCC) 07/03/2015   Degeneration of intervertebral disc of lumbar region 07/03/2015   Hand paresthesia 07/03/2015   Major depressive disorder, recurrent, severe without psychotic features (HCC) 12/21/2014   Panic disorder 12/21/2014   Impingement syndrome of shoulder 07/22/2014   Infraspinatus tenosynovitis 07/22/2014    Impingement syndrome of left shoulder 07/22/2014   Other synovitis and tenosynovitis, left shoulder 07/22/2014   Left supraspinatus tenosynovitis 07/22/2014   Acquired hypothyroidism 07/12/2014   Pain in shoulder 07/12/2014   Clinical depression 07/12/2014   Hyperlipidemia 07/12/2014   ONSET DATE: 11/12/2023  REFERRING DIAG: Right Carpal Tunnel Release  THERAPY DIAG:  Carpal tunnel syndrome, right upper limb  Muscle weakness (generalized)  Other lack of coordination  Rationale for Evaluation and Treatment: Rehabilitation  SUBJECTIVE:   SUBJECTIVE STATEMENT: Pt reports that she had increased arthritis pain after using theraputty.  Pt accompanied by: self  PERTINENT HISTORY:  Pt. is a 66 y. o. who had Carpal Tunnel Surgery on 11/12/2023. Pt. 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 Pt. has had recent injections of the Right 1st digit CMC arthritis, and injections to the left 2nd, and 3rd digit trigger fingers. PMHx includes: CAD with CABG in 2001, Gastric Bypass surgery, Anxiety, Depression, Disease of the thyroid  gland, HTN, and OSA.  PRECAUTIONS: None  WEIGHT BEARING RESTRICTIONS: No  PAIN:  Are you having pain? 01/07/24: 3/10 pain at rest 01/05/24: 3-4/10 pain at rest, can be 8/10 with activity, but this activity is heavy work (lifting furniture/boxes, getting up off the floor) 01/01/24: /10 at rest, 7/10 raw sensation at incision, 7/10 pain with AROM at end point wrist extension.  12/15/2023: 2/10 pain at rest, 7-8/10 raw sensation at the incision scar. 4-5/10 pain with AROM during wrist extension.  1/10- Pt. Reports 10/10 pain when attempting to use her hand, or pick up her pet  FALLS: Has patient fallen in last 6 months? Yes, fall occurred on 12/08/2023  LIVING ENVIRONMENT: Lives with: lives with their spouse Lives in: House/apartment Stairs: one story Has following equipment: Has access to wrist braces in needed  PLOF:   Difficulty typing prior to the surgery  PATIENT GOALS:  To relief pain, and increase function  OBJECTIVE:  Note: Objective measures were completed at Evaluation unless otherwise noted.  HAND DOMINANCE: Right  ADLs:  Transfers/ambulation related to ADLs: Eating: Independent, self-feeding, difficulty cutting food, opening bottles, packets, containers Grooming: Painful when brushing teeth with the right hand UB Dressing: Pt. Reports that she is improving with fastening a  bra LB Dressing:  zipping pants, buttoning pants are difficult to perform Toileting: Difficulty with toileting hygiene care Bathing: Independent-Takes tub baths Tub Shower transfers: Difficulty at times getting in and out of the tub for tub baths.  IADLs: Shopping: Carrying heavy it Light housekeeping: Pt. Husband performs housekeeping tasks Meal Prep: Husband typically performs meal prep tasks.  Community mobility:  Independent. Medication management:  Difficulty opening bottles Financial management: No change  Handwriting: 50% legibility for name   Hobbies: Woodworking/carving/building furniture  MOBILITY STATUS: Independent  FUNCTIONAL OUTCOME MEASURES: MAM-20: 64/80  UPPER EXTREMITY ROM:    Active ROM Right Eval  Right 12/15/2023 Left eval  Shoulder flexion [facility]  [facility]  Shoulder abduction [facility]  [facility]  Shoulder adduction     Shoulder extension     Shoulder internal rotation     Shoulder external rotation     Elbow flexion WFL  WFL  Elbow extension [facility]  WFL  Wrist flexion 74(74)  86(86)  Wrist extension 64(68) 22(comfort level), 44 (52 passively) 64(66)  Wrist ulnar deviation 32(34)  38(38)  Wrist radial deviation 24(26)  24(26)  Wrist pronation     Wrist supination     (Blank rows = not tested)  UPPER EXTREMITY MMT:     MMT Right Eval  Left Eval   Shoulder flexion 5/5 5/5  Shoulder abduction 5/5 5/5  Shoulder adduction    Shoulder extension    Shoulder internal rotation    Shoulder  external rotation    Middle trapezius    Lower trapezius    Elbow flexion 5/5 5/5  Elbow extension 5/5 5/5  Wrist flexion 4/5 5/5  Wrist extension 4/5 5/5  Wrist ulnar deviation    Wrist radial deviation    Wrist pronation    Wrist supination    (Blank rows = not tested)  HAND FUNCTION: Grip strength: Right: 10 lbs; Left: 25 lbs, Lateral pinch: Right: 8 lbs, Left: 10 lbs, and 3 point pinch: Right: 4 lbs, Left: 7 lbs 01/05/24: Grip strength: Right: 25 lbs, Left: 30 lbs  COORDINATION: 9 Hole Peg test: Right: 27 sec; Left: 21 sec 01/07/24: R: 21 sec  SENSATION: Light touch: WFL  EDEMA: none  MUSCLE TONE:  Intact  COGNITION: Overall cognitive status: Within functional limits for tasks assessed   TREATMENT DATE: 01/07/24  Manual Therapy:  -Scar massage completed at and around carpal tunnel incision -Soft tissue massage performed at the thenar eminence and hypothenar eminence, working to reduce pain and stiffness.    Therapeutic Ex.:  -Red theraband issued for wrist strengthening exercises - completed 1 set x 10 reps each wrist flexion, extension, and radial deviation. Handout provided.  - Ongoing education on working with exercises conservatively, maintaining low reps to avoid increased arthritic pain.  Able to return demo with intermittent min vc for technique to improve quality of movement.    Therapeutic Activity: -Pt reports FMC improved with decreased pain; completed 9 hole peg test R 21 sec (appears at baseline).  -Education on [facility] activities for HEP   PATIENT EDUCATION: Education details: HEP progression Person educated: Patient Education method: Programmer, multimedia, Facilities manager, Verbal cues, and Handouts Education comprehension: verbalized understanding, returned demonstration, verbal cues required, tactile cues required, and needs further education  HOME EXERCISE PROGRAM: Blue Resistive Foam Block exercises: 3 point pinch, lateral pinch and gross grip. Yellow  theraputty (recommendation to perform conservatively/low reps/to tolerance)  Access Code [email or web address]  GOALS: Goals reviewed with patient? No  SHORT TERM GOALS: Target date: 01/15/2024   Pt. will be independent with HEPs for RUE strength Baseline: Eval: No current HEP Goal status: INITIAL  LONG TERM GOALS: Target date: 02/26/2024  To decrease pain by 2 points on the pain scale to be able to engage in ADLs/IADLs Baseline: Eval: 2/10 with 8/10 intermittent shooting pain in the right wrist/hand. Goal status: INITIAL  2.  Pt. Will  improve right grip strength by 5# to be able to open  jars, and containers  more efficiently.  Baseline: Eval: Grip strength: Right: 10#, Left: 25# Goal status: INITIAL  3.  Pt. Will improve right lateral pinch strength by 3# to be able to hold and use a toothbrush efficiently. Baseline: Eval: Lateral pinch strength: Right: 8#, Left 10 # Goal status: INITIAL  4.  Pt. Will improve right 3pt. pinch strength to be able to to be able to cut food. Baseline: Eval: 3Pt. Pinch strength: Right: 4#, L: 7# Goal status: INITIAL  5.  Pt. Will improve right hand [facility] skills by 2 sec. Of speed to be able to grasp and manage pant buttons, and zippers. Baseline: Eval: FMC skills: Right: 27 sec., Left: 21 sec.  Goal status: INITIAL  6.  Pt. Will improve MAM-20 score by 3 points to reflect improved ADL/IADL performance Baseline: Eval: MAM-20 sum score: 64/80 Goal status: INITIAL  ASSESSMENT:  CLINICAL IMPRESSION: Pt reports plan to pause OT sessions until after follow up appointment with hand surgeon next week. Ongoing education on importance of rest to facilitate recovery and pain management. Pt reports arthritic pain after yellow theraputty use at home, issued red theraband for resistive wrist exercises with handout provided. Fair tolerance to manual therapy today with scar massage and soft tissue massage. R 9 hole peg re-tested with great improvement (21sec), appears at  baseline. Pt. continues to benefit from OT services to work on improving right hand pain, increasing right grip, and pinch strength, and improve [facility] skills in order to be able to perform daily ADL, and IADL tasks more efficiently.  PERFORMANCE DEFICITS: in functional skills including ADLs, IADLs, coordination, dexterity, ROM, strength, pain, Fine motor control, Gross motor control, and UE functional use, cognitive skills including , and psychosocial skills including environmental adaptation and routines and behaviors.   IMPAIRMENTS: are limiting patient from ADLs, IADLs, and leisure.   CO-MORBIDITIES: may have co-morbidities  that affects occupational performance. Patient will benefit from skilled OT to address above impairments and improve overall function.  MODIFICATION OR ASSISTANCE TO COMPLETE EVALUATION: Min-Moderate modification of tasks or assist with assess necessary to complete an evaluation.  OT OCCUPATIONAL PROFILE AND HISTORY: Detailed assessment: Review of records and additional review of physical, cognitive, psychosocial history related to current functional performance.  CLINICAL DECISION MAKING: Moderate - several treatment options, min-mod task modification necessary  REHAB POTENTIAL: Good  EVALUATION COMPLEXITY: Moderate    PLAN:  OT FREQUENCY: 2x/week  OT DURATION: 12 weeks  PLANNED INTERVENTIONS: 97535 self care/ADL training, 02889 therapeutic exercise, 97530 therapeutic activity, 97112 neuromuscular re-education, 97140 manual therapy, 97035 ultrasound, 97018 paraffin, 02989 moist heat, 97010 cryotherapy, 97034 contrast bath, 97033 iontophoresis, 97760 Orthotic Initial, 97763 Orthotic/Prosthetic subsequent, scar mobilization, patient/family education, and DME and/or AE instructions  RECOMMENDED OTHER SERVICES: None  CONSULTED AND AGREED WITH PLAN OF CARE: Patient  PLAN FOR NEXT SESSION: Treatment  Elston Slot, M.S. OTR/L  01/07/24, 8:11 AM  ascom  [id number]   01/07/2024, 8:11 AM

## 2024-08-09 ENCOUNTER — Other Ambulatory Visit

## 2024-08-23 ENCOUNTER — Ambulatory Visit: Admitting: Internal Medicine

## 2024-08-30 ENCOUNTER — Ambulatory Visit: Admitting: Physician Assistant
